# Patient Record
Sex: Female | Born: 1940 | Race: White | Hispanic: No | Smoking: Never smoker
Health system: Southern US, Community
[De-identification: ages and names within clinical notes are randomized; demographics above are authoritative.]

## PROBLEM LIST (undated history)

## (undated) ENCOUNTER — Emergency Department (HOSPITAL_COMMUNITY): Admission: EM | Payer: Medicare Other | Source: Home / Self Care

## (undated) DIAGNOSIS — M810 Age-related osteoporosis without current pathological fracture: Secondary | ICD-10-CM

## (undated) DIAGNOSIS — E039 Hypothyroidism, unspecified: Secondary | ICD-10-CM

## (undated) DIAGNOSIS — R42 Dizziness and giddiness: Secondary | ICD-10-CM

## (undated) DIAGNOSIS — K851 Biliary acute pancreatitis without necrosis or infection: Secondary | ICD-10-CM

## (undated) DIAGNOSIS — J8410 Pulmonary fibrosis, unspecified: Secondary | ICD-10-CM

## (undated) HISTORY — DX: Dizziness and giddiness: R42

## (undated) HISTORY — DX: Age-related osteoporosis without current pathological fracture: M81.0

## (undated) HISTORY — PX: TUBAL LIGATION: SHX77

## (undated) HISTORY — DX: Biliary acute pancreatitis without necrosis or infection: K85.10

## (undated) HISTORY — DX: Hypothyroidism, unspecified: E03.9

## (undated) HISTORY — DX: Pulmonary fibrosis, unspecified: J84.10

---

## 2000-08-20 ENCOUNTER — Other Ambulatory Visit: Admission: RE | Admit: 2000-08-20 | Discharge: 2000-08-20 | Payer: Self-pay | Admitting: Obstetrics and Gynecology

## 2000-08-20 ENCOUNTER — Encounter (INDEPENDENT_AMBULATORY_CARE_PROVIDER_SITE_OTHER): Payer: Self-pay

## 2001-08-15 ENCOUNTER — Other Ambulatory Visit: Admission: RE | Admit: 2001-08-15 | Discharge: 2001-08-15 | Payer: Self-pay | Admitting: Obstetrics and Gynecology

## 2005-05-06 ENCOUNTER — Ambulatory Visit: Payer: Self-pay | Admitting: Critical Care Medicine

## 2005-05-13 ENCOUNTER — Ambulatory Visit: Payer: Self-pay | Admitting: Critical Care Medicine

## 2005-05-13 ENCOUNTER — Ambulatory Visit (HOSPITAL_COMMUNITY): Admission: RE | Admit: 2005-05-13 | Discharge: 2005-05-13 | Payer: Self-pay | Admitting: Critical Care Medicine

## 2005-05-13 ENCOUNTER — Encounter (INDEPENDENT_AMBULATORY_CARE_PROVIDER_SITE_OTHER): Payer: Self-pay | Admitting: Specialist

## 2005-06-24 ENCOUNTER — Ambulatory Visit: Payer: Self-pay | Admitting: Critical Care Medicine

## 2005-08-03 ENCOUNTER — Ambulatory Visit: Payer: Self-pay | Admitting: Critical Care Medicine

## 2005-09-15 ENCOUNTER — Ambulatory Visit: Payer: Self-pay | Admitting: Critical Care Medicine

## 2005-11-06 ENCOUNTER — Ambulatory Visit: Payer: Self-pay | Admitting: Critical Care Medicine

## 2006-01-21 ENCOUNTER — Ambulatory Visit: Payer: Self-pay | Admitting: Pulmonary Disease

## 2006-02-26 ENCOUNTER — Ambulatory Visit: Payer: Self-pay | Admitting: Critical Care Medicine

## 2006-06-09 ENCOUNTER — Ambulatory Visit: Payer: Self-pay | Admitting: Critical Care Medicine

## 2006-10-08 ENCOUNTER — Ambulatory Visit: Payer: Self-pay | Admitting: Critical Care Medicine

## 2006-11-03 ENCOUNTER — Ambulatory Visit: Payer: Self-pay | Admitting: Critical Care Medicine

## 2007-02-02 ENCOUNTER — Ambulatory Visit: Payer: Self-pay | Admitting: Critical Care Medicine

## 2007-03-16 ENCOUNTER — Ambulatory Visit: Payer: Self-pay | Admitting: Pulmonary Disease

## 2007-06-30 ENCOUNTER — Ambulatory Visit: Payer: Self-pay | Admitting: Critical Care Medicine

## 2007-07-03 DIAGNOSIS — J4540 Moderate persistent asthma, uncomplicated: Secondary | ICD-10-CM

## 2007-07-03 DIAGNOSIS — J8410 Pulmonary fibrosis, unspecified: Secondary | ICD-10-CM | POA: Insufficient documentation

## 2007-07-03 DIAGNOSIS — E039 Hypothyroidism, unspecified: Secondary | ICD-10-CM

## 2007-11-24 ENCOUNTER — Ambulatory Visit: Payer: Self-pay | Admitting: Critical Care Medicine

## 2007-11-29 ENCOUNTER — Telehealth (INDEPENDENT_AMBULATORY_CARE_PROVIDER_SITE_OTHER): Payer: Self-pay | Admitting: *Deleted

## 2007-12-15 ENCOUNTER — Ambulatory Visit: Payer: Self-pay | Admitting: Critical Care Medicine

## 2008-03-20 ENCOUNTER — Ambulatory Visit: Payer: Self-pay | Admitting: Critical Care Medicine

## 2008-06-08 ENCOUNTER — Ambulatory Visit: Payer: Self-pay | Admitting: Internal Medicine

## 2008-07-11 ENCOUNTER — Ambulatory Visit: Payer: Self-pay | Admitting: Critical Care Medicine

## 2008-10-10 ENCOUNTER — Ambulatory Visit: Payer: Self-pay | Admitting: Critical Care Medicine

## 2008-10-26 HISTORY — PX: CHOLECYSTECTOMY: SHX55

## 2008-11-02 ENCOUNTER — Ambulatory Visit: Payer: Self-pay | Admitting: Internal Medicine

## 2009-02-12 ENCOUNTER — Ambulatory Visit: Payer: Self-pay | Admitting: Critical Care Medicine

## 2009-03-29 ENCOUNTER — Inpatient Hospital Stay (HOSPITAL_COMMUNITY): Admission: EM | Admit: 2009-03-29 | Discharge: 2009-04-03 | Payer: Self-pay | Admitting: Emergency Medicine

## 2009-03-29 ENCOUNTER — Ambulatory Visit: Payer: Self-pay | Admitting: Internal Medicine

## 2009-03-30 ENCOUNTER — Ambulatory Visit: Payer: Self-pay | Admitting: Internal Medicine

## 2009-04-01 ENCOUNTER — Ambulatory Visit: Payer: Self-pay | Admitting: Internal Medicine

## 2009-04-03 ENCOUNTER — Encounter (INDEPENDENT_AMBULATORY_CARE_PROVIDER_SITE_OTHER): Payer: Self-pay | Admitting: General Surgery

## 2009-04-22 ENCOUNTER — Encounter: Payer: Self-pay | Admitting: Internal Medicine

## 2009-06-12 ENCOUNTER — Ambulatory Visit: Payer: Self-pay | Admitting: Critical Care Medicine

## 2009-10-14 ENCOUNTER — Ambulatory Visit: Payer: Self-pay | Admitting: Critical Care Medicine

## 2009-10-15 DIAGNOSIS — S2239XA Fracture of one rib, unspecified side, initial encounter for closed fracture: Secondary | ICD-10-CM

## 2009-11-18 ENCOUNTER — Ambulatory Visit: Payer: Self-pay | Admitting: Internal Medicine

## 2010-01-08 ENCOUNTER — Ambulatory Visit: Payer: Self-pay | Admitting: Critical Care Medicine

## 2010-01-08 DIAGNOSIS — J309 Allergic rhinitis, unspecified: Secondary | ICD-10-CM

## 2010-01-08 HISTORY — DX: Allergic rhinitis, unspecified: J30.9

## 2010-04-15 ENCOUNTER — Ambulatory Visit: Payer: Self-pay | Admitting: Critical Care Medicine

## 2010-10-03 ENCOUNTER — Ambulatory Visit: Payer: Self-pay | Admitting: Critical Care Medicine

## 2010-11-05 ENCOUNTER — Encounter: Payer: Self-pay | Admitting: Critical Care Medicine

## 2010-11-05 ENCOUNTER — Ambulatory Visit
Admission: RE | Admit: 2010-11-05 | Discharge: 2010-11-05 | Payer: Self-pay | Source: Home / Self Care | Attending: Critical Care Medicine | Admitting: Critical Care Medicine

## 2010-11-10 ENCOUNTER — Encounter: Payer: Self-pay | Admitting: Critical Care Medicine

## 2010-11-17 ENCOUNTER — Encounter: Payer: Self-pay | Admitting: Family Medicine

## 2010-11-27 NOTE — Assessment & Plan Note (Signed)
Summary: Pulmonary OV   Primary Provider/Referring Provider:  Dr. Rudi Heap  CC:  6 month follow up.  c/o "having a hard time getting deep breathing sometimes" x couple of wks.  Occas nonprod cough.  Deneis wheezing and chest tightness. Marland Kitchen  History of Present Illness: This is a 70 year old, white female with underlying moderate, persistent asthma, and idiopathic pulmonary fibrosis.    November 18, 2009--Presents for an acute office visit. Complains of chest congestion- prod cough(yellow)-runny nose- chest tightness -sorethroat- finished Amox 500mg  5 dyas ago.  Cough did not improve, worse last 2 day with thick yellow mucus. Denies chest pain,  orthopnea, hemoptysis, fever, n/v/d, edema, headache.     January 08, 2010 10:00 AM  URI over the weekend.  Notes more coughing and min productive.  No wheeze.  Notes some pndrip Yellow mucous.  sl yellow out of the nose.   Other wise has been stable   April 15, 2010 1:47 PM Was at church all day 6/20 and felt weak.  Today is better.  Has a sl cough.  Pt notes dyspnea with exertion.  No real chest pain.  No real wheezing.  No f/c/s.  Sl pn drip.  No mucus.  No new issues. Pt denies any significant sore throat, nasal congestion or excess secretions, fever, chills, sweats, unintended weight loss, pleurtic or exertional chest pain, orthopnea PND, or leg swelling Pt denies any increase in rescue therapy over baseline, denies waking up needing it or having any early am or nocturnal exacerbations of coughing/wheezing/or dyspnea.  October 03, 2010 11:35 AM Hard time getting deep breath.  no real cough.  no wheeze.  no mucus.  Had spell of vertigo but now better rx by PCP No chest pain.  Had flu vaccine   Clinical Reports Reviewed:  CXR:  10/14/2009: CXR Results:  IMPRESSION: Right ninth rib fracture is suspected. Tiny pleural effusions.     Current Medications (verified): 1)  Synthroid 88 Mcg  Tabs (Levothyroxine Sodium) .Marland Kitchen.. 1 By Mouth Daily 2)   Adult Aspirin Low Strength 81 Mg  Tbdp (Aspirin) .... One Daily 3)  Vitamin B-12 Cr 1000 Mcg  Tbcr (Cyanocobalamin) .... One Daily 4)  Centrum Silver   Tabs (Multiple Vitamins-Minerals) .... One Daily 5)  Proair Hfa 108 (90 Base) Mcg/act  Aers (Albuterol Sulfate) .Marland Kitchen.. 1-2 Puffs Every 4-6 Hours As Needed 6)  Qvar 40 Mcg/act Aers (Beclomethasone Dipropionate) .... Two Puff Daily 7)  Calcium 600 1500 Mg Tabs (Calcium Carbonate) .... Take 1 Tablet By Mouth Once A Day 8)  Fish Oil 1000 Mg Caps (Omega-3 Fatty Acids) .... Take 1 Tablet By Mouth Once A Day  Allergies (verified): 1)  ! Sulfa  Review of Systems       The patient complains of shortness of breath with activity.  The patient denies shortness of breath at rest, productive cough, non-productive cough, coughing up blood, chest pain, irregular heartbeats, acid heartburn, indigestion, loss of appetite, weight change, abdominal pain, difficulty swallowing, sore throat, tooth/dental problems, headaches, nasal congestion/difficulty breathing through nose, sneezing, itching, ear ache, anxiety, depression, hand/feet swelling, joint stiffness or pain, rash, change in color of mucus, and fever.    Vital Signs:  Patient profile:   71 year old female Height:      62 inches Weight:      147.50 pounds BMI:     27.08 O2 Sat:      95 % on Room air Temp:     98.0 degrees F  oral Pulse rate:   68 / minute BP sitting:   132 / 84  (right arm) Cuff size:   regular  Vitals Entered By: Gweneth Dimitri RN (October 03, 2010 11:17 AM)  O2 Flow:  Room air CC: 6 month follow up.  c/o "having a hard time getting deep breathing sometimes" x couple of wks.  Occas nonprod cough.  Deneis wheezing and chest tightness.  Comments Medications reviewed with patient Daytime contact number verified with patient. Gweneth Dimitri RN  October 03, 2010 11:17 AM    Physical Exam  Additional Exam:  Gen: Pleasant, well nourished, in no distress ENT: mild erythema, mild  purulence  Neck: No JVD, no TMG, no carotid bruits Lungs:  coarse BS w/ no wheezing.  Cardiovascular: RRR, heart sounds normal, no murmurs or gallops, no peripheral edema Musculoskeletal: No deformities, no cyanosis or clubbing     Impression & Recommendations:  Problem # 1:  FIBROSIS, POSTINFLAMMATORY PULMONARY (ICD-515) Assessment Unchanged  Idiopathic pulmonary fibrosis, stable at this time. Plan continue off systemic steroids at this time.   repeat PFTs in one month  Her updated medication list for this problem includes:    Qvar 40 Mcg/act Aers (Beclomethasone dipropionate) .Marland Kitchen... 2 puff daily    Proair Hfa 108 (90 Base) Mcg/act Aers (Albuterol sulfate) .Marland Kitchen... 1-2 puffs every 4-6 hours as needed  Orders: Est. Patient Level III (47829)  Complete Medication List: 1)  Synthroid 88 Mcg Tabs (Levothyroxine sodium) .Marland Kitchen.. 1 by mouth daily 2)  Adult Aspirin Low Strength 81 Mg Tbdp (Aspirin) .... One daily 3)  Vitamin B-12 Cr 1000 Mcg Tbcr (Cyanocobalamin) .... One daily 4)  Centrum Silver Tabs (Multiple vitamins-minerals) .... One daily 5)  Proair Hfa 108 (90 Base) Mcg/act Aers (Albuterol sulfate) .Marland Kitchen.. 1-2 puffs every 4-6 hours as needed 6)  Qvar 40 Mcg/act Aers (Beclomethasone dipropionate) .... Two puff daily 7)  Calcium 600 1500 Mg Tabs (Calcium carbonate) .... Take 1 tablet by mouth once a day 8)  Fish Oil 1000 Mg Caps (Omega-3 fatty acids) .... Take 1 tablet by mouth once a day  Other Orders: Pulmonary Referral (Pulmonary)   Patient Instructions: 1)  A full pulmonary function study will be obtained sometime after Christmas, January is ok 2)  No change in medications 3)  Return in       4   months   Immunization History:  Influenza Immunization History:    Influenza:  historical (06/26/2010)   Prevention & Chronic Care Immunizations   Influenza vaccine: Historical  (06/26/2010)    Tetanus booster: Not documented    Pneumococcal vaccine: Pneumovax  (06/27/2005)     H. zoster vaccine: Not documented  Colorectal Screening   Hemoccult: Not documented    Colonoscopy: Not documented  Other Screening   Pap smear: Not documented    Mammogram: Not documented    DXA bone density scan: Not documented   Smoking status: never  (04/15/2010)  Lipids   Total Cholesterol: Not documented   LDL: Not documented   LDL Direct: Not documented   HDL: Not documented   Triglycerides: Not documented  Appended Document: Pulmonary OV fax don moore

## 2010-11-27 NOTE — Miscellaneous (Signed)
Summary: PFTs   Pulmonary Function Test Date: 11/05/2010 Height (in.): 60 Gender: Female  Pre-Spirometry FVC    Value: 2.84 L/min   Pred: 2.42 L/min     % Pred: 117 % FEV1    Value: 1.31 L     Pred: 1.71 L     % Pred: 77 % FEV1/FVC  Value: 46 %     Pred: 72 %    FEF 25-75  Value: 0.35 L/min   Pred: 2.11 L/min     % Pred: 17 %  Post-Spirometry FVC    Value: 2.76 L/min   Pred: 2.42 L/min     % Pred: 114 % FEV1    Value: 1.42 L     Pred: 1.71 L     % Pred: 83 % FEV1/FVC  Value: 51 %     Pred: 72 %    FEF 25-75  Value: 0.48 L/min   Pred: 2.11 L/min     % Pred: 23 %  Lung Volumes TLC    Value: 4.21 L   % Pred: 104 % RV    Value: 1.37 L   % Pred: 86 % DLCO    Value: 15.2 %   % Pred: 86 % DLCO/VA  Value: 4.17 %   % Pred: 118 %  Comments: Stable lung volumes and DLCO,  severe peripheral airflow obstruction   Clinical Lists Changes  Observations: Added new observation of PFT COMMENTS: Stable lung volumes and DLCO,  severe peripheral airflow obstruction  (11/10/2010 17:11) Added new observation of DLCO/VA%EXP: 118 % (11/10/2010 17:11) Added new observation of DLCO/VA: 4.17 % (11/10/2010 17:11) Added new observation of DLCO % EXPEC: 86 % (11/10/2010 17:11) Added new observation of DLCO: 15.2 % (11/10/2010 17:11) Added new observation of RV % EXPECT: 86 % (11/10/2010 17:11) Added new observation of RV: 1.37 L (11/10/2010 17:11) Added new observation of TLC % EXPECT: 104 % (11/10/2010 17:11) Added new observation of TLC: 4.21 L (11/10/2010 17:11) Added new observation of FEF2575%EXPS: 23 % (11/10/2010 17:11) Added new observation of PSTFEF25/75P: 2.11  (11/10/2010 17:11) Added new observation of PSTFEF25/75%: 0.48 L/min (11/10/2010 17:11) Added new observation of FEV1FVCPRDPS: 72 % (11/10/2010 17:11) Added new observation of PSTFEV1/FVC: 51 % (11/10/2010 17:11) Added new observation of POSTFEV1%PRD: 83 % (11/10/2010 17:11) Added new observation of FEV1PRDPST: 1.71 L (11/10/2010  17:11) Added new observation of POST FEV1: 1.42 L/min (11/10/2010 17:11) Added new observation of POST FVC%EXP: 114 % (11/10/2010 17:11) Added new observation of FVCPRDPST: 2.42 L/min (11/10/2010 17:11) Added new observation of POST FVC: 2.76 L (11/10/2010 17:11) Added new observation of FEF % EXPEC: 17 % (11/10/2010 17:11) Added new observation of FEF25-75%PRE: 2.11 L/min (11/10/2010 17:11) Added new observation of FEF 25-75%: 0.35 L/min (11/10/2010 17:11) Added new observation of FEV1/FVC PRE: 72 % (11/10/2010 17:11) Added new observation of FEV1/FVC: 46 % (11/10/2010 17:11) Added new observation of FEV1 % EXP: 77 % (11/10/2010 17:11) Added new observation of FEV1 PREDICT: 1.71 L (11/10/2010 17:11) Added new observation of FEV1: 1.31 L (11/10/2010 17:11) Added new observation of FVC % EXPECT: 117 % (11/10/2010 17:11) Added new observation of FVC PREDICT: 2.42 L (11/10/2010 17:11) Added new observation of FVC: 2.84 L (11/10/2010 17:11) Added new observation of PFT HEIGHT: 60  (11/10/2010 17:11) Added new observation of PFT DATE: 11/05/2010  (11/10/2010 17:11)  Appended Document: PFTs result noted  patient aware

## 2010-11-27 NOTE — Assessment & Plan Note (Signed)
Summary: congested/ mbw   Primary Provider/Referring Provider:  Dr. Rudi Heap  CC:  chest congestion- prod cough(yellow)-runny nose- chest tightness -sorethroat- finished Amox 500mg  5 dyas ago.  History of Present Illness: This is a 70 year old, white female with underlying moderate, persistent asthma, and idiopathic pulmonary fibrosis.    June 12, 2009 10:45 AM No new complaints  Pt denies any significant sore throat, nasal congestion or excess secretions, fever, chills, sweats, unintended weight loss, pleurtic or exertional chest pain, orthopnea PND, or leg swelling Pt denies any increase in rescue therapy over baseline, denies waking up needing it or having any early am or nocturnal exacerbations of coughing/wheezing/or dyspnea.  October 14, 2009 9:25 AM Larey Seat over the weekend.  Has pain on R side and into the back.  Tripped over a box. Hit a picture frame and smashed this. Prior to the fall was noting more dyspnea over the past month. No increase in cough.  No wheeze.  No edema in the feet.  No f/c/s.  November 18, 2009--Presents for an acute office visit. Complains of chest congestion- prod cough(yellow)-runny nose- chest tightness -sorethroat- finished Amox 500mg  5 dyas ago.  Cough did not improve, worse last 2 day with thick yellow mucus. Denies chest pain,  orthopnea, hemoptysis, fever, n/v/d, edema, headache.       Allergies (verified): 1)  ! 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 3x a week 2 cups caffeine a day married 3 children  Risk Factors: Smoking Status: never (10/14/2009)  Review of Systems      See HPI  Vital Signs:  Patient profile:   70 year old female Weight:      148.25 pounds O2 Sat:      98 % on Room air Temp:     97 degrees F oral Pulse rate:   64 / minute BP sitting:   122 / 70  (left arm) Cuff size:   regular  Vitals Entered By: Abigail Miyamoto RN (November 18, 2009 11:54 AM)  O2 Flow:  Room air  Physical Exam  Additional Exam:  Gen: Pleasant, well nourished, in no distress ENT: no lesions, no postnasal drip Neck: No JVD, no TMG, no carotid bruits Lungs:  coarse BS w/ no wheezing.  Cardiovascular: RRR, heart sounds normal, no murmurs or gallops, no peripheral edema Musculoskeletal: No deformities, no cyanosis or clubbing     Impression & Recommendations:  Problem # 1:  ASTHMA (ICD-493.90)  Exacerbation -slow to resolve.  REC:  Levaquin 500mg  once daily for 7 days Mucinex DM two times a day as needed cough/congestion  Hold fish oil untill cough is gone Please contact office for sooner follow up if symptoms do not improve or worsen  follow up Dr. Delford Field in 6-8 weeks   Orders: Est. Patient Level IV (16109)  Medications Added to Medication List This Visit: 1)  Reme Hist Dm 7.5-12.5-15 Mg/49ml Liqd (Phenylephrine-pyrilamine-dm) .... Take one tsp four times a day as needed cough 2)  Levaquin 500 Mg Tabs (Levofloxacin) .Marland Kitchen.. 1 by mouth once daily  Complete Medication List: 1)  Synthroid 88 Mcg Tabs (Levothyroxine sodium) .Marland Kitchen.. 1 by mouth daily 2)  Adult Aspirin Low Strength 81 Mg Tbdp (Aspirin) .... One daily 3)  Vitamin B-12 Cr 1000 Mcg Tbcr (Cyanocobalamin) .... One daily 4)  Centrum Silver Tabs (Multiple vitamins-minerals) .... One daily 5)  Proair Hfa 108 (90 Base) Mcg/act Aers (Albuterol sulfate) .Marland Kitchen.. 1-2 puffs  every 4-6 hours as needed 6)  Qvar 40 Mcg/act Aers (Beclomethasone dipropionate) .... 2 puffs once daily 7)  Calcium 600 1500 Mg Tabs (Calcium carbonate) .... Take 1 tablet by mouth once a day 8)  Fish Oil 1000 Mg Caps (Omega-3 fatty acids) .... Take 1 tablet by mouth once a day 9)  Reme Hist Dm 7.5-12.5-15 Mg/66ml Liqd (Phenylephrine-pyrilamine-dm) .... Take one tsp four times a day as needed cough 10)  Levaquin 500 Mg Tabs (Levofloxacin) .Marland Kitchen.. 1 by mouth once daily  Patient Instructions: 1)  Levaquin 500mg  once daily for 7 days 2)  Mucinex DM two times a day as needed cough/congestion  3)  Hold fish oil untill cough is gone 4)  Please contact office for sooner follow up if symptoms do not improve or worsen  5)  follow up Dr. Delford Field in 6-8 weeks  Prescriptions: LEVAQUIN 500 MG TABS (LEVOFLOXACIN) 1 by mouth once daily  #7 x 0   Entered and Authorized by:   Rubye Oaks NP   Signed by:   Rubye Oaks NP on 11/18/2009   Method used:   Electronically to        [facility] [phone]* (retail)       [address] [place], [place]  [address]       Ph: [phone] or [phone]       Fax: [phone]   RxID:   [id number]    Influenza Immunization History:    Influenza # 1:  Historical (07/31/2009)

## 2010-11-27 NOTE — Assessment & Plan Note (Signed)
Summary: Pulmonary OV   Primary Provider/Referring Provider:  Dr. Rudi Heap  CC:  3 month follow up.   Pt states she is occ having a difficult time getting a deep breath x2wks.  States she has a slight cough-nonprod.  Denies wheezing and chest tightness.  [name]  History of Present Illness: This is a 70 year old, white female with underlying moderate, persistent asthma, and idiopathic pulmonary fibrosis.    November 18, 2009--Presents for an acute office visit. Complains of chest congestion- prod cough(yellow)-runny nose- chest tightness -sorethroat- finished Amox 500mg  5 dyas ago.  Cough did not improve, worse last 2 day with thick yellow mucus. Denies chest pain,  orthopnea, hemoptysis, fever, n/v/d, edema, headache.     January 08, 2010 10:00 AM  URI over the weekend.  Notes more coughing and min productive.  No wheeze.  Notes some pndrip Yellow mucous.  sl yellow out of the nose.   Other wise has been stable   April 15, 2010 1:47 PM Was at church all day 6/20 and felt weak.  Today is better.  Has a sl cough.  Pt notes dyspnea with exertion.  No real chest pain.  No real wheezing.  No f/c/s.  Sl pn drip.  No mucus.  No new issues. Pt denies any significant sore throat, nasal congestion or excess secretions, fever, chills, sweats, unintended weight loss, pleurtic or exertional chest pain, orthopnea PND, or leg swelling Pt denies any increase in rescue therapy over baseline, denies waking up needing it or having any early am or nocturnal exacerbations of coughing/wheezing/or dyspnea.    Asthma History    Asthma Control Assessment:    Age range: 12+ years    Symptoms: 0-2 days/week    Nighttime Awakenings: 0-2/month    Interferes w/ normal activity: no limitations    SABA use (not for EIB): 0-2 days/week    ATAQ questionnaire: 0    Exacerbations requiring oral systemic steroids: 0-1/year    Asthma Control Assessment: Well Controlled   Preventive Screening-Counseling &  Management  Alcohol-Tobacco     Smoking Status: never  Current Medications (verified): 1)  Synthroid 88 Mcg  Tabs (Levothyroxine Sodium) .[name].. 1 By Mouth Daily 2)  Adult Aspirin Low Strength 81 Mg  Tbdp (Aspirin) .... One Daily 3)  Vitamin B-12 Cr 1000 Mcg  Tbcr (Cyanocobalamin) .... One Daily 4)  Centrum Silver   Tabs (Multiple Vitamins-Minerals) .... One Daily 5)  Proair Hfa 108 (90 Base) Mcg/act  Aers (Albuterol Sulfate) .[name].. 1-2 Puffs Every 4-6 Hours As Needed 6)  Qvar 40 Mcg/act Aers (Beclomethasone Dipropionate) .... Two Puff Daily 7)  Calcium 600 1500 Mg Tabs (Calcium Carbonate) .... Take 1 Tablet By Mouth Once A Day 8)  Fish Oil 1000 Mg Caps (Omega-3 Fatty Acids) .... Take 1 Tablet By Mouth Once A Day  Allergies (verified): 1)  ! Sulfa  Past History:  Past medical, surgical, family and social histories (including risk factors) reviewed, and no changes noted (except as noted below).  Past Medical History: Reviewed history from 06/12/2009 and no changes required. 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 Cholecystectomy      -2010  Family History: Reviewed history from 06/08/2008 and no changes required. mother died at 60 from CHF, also had emphysema father died at 81 from blood clot in the brain 2 sisters, both had breast cancer 1 sister  had asthma  Social History: Reviewed history from 06/08/2008 and no changes required. 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  The patient denies shortness of breath at rest, productive cough, non-productive cough, coughing up blood, chest pain,  irregular heartbeats, acid heartburn, indigestion, loss of appetite, weight change, abdominal pain, difficulty swallowing, sore throat, tooth/dental problems, headaches, nasal congestion/difficulty breathing through nose, sneezing, itching, ear ache, anxiety, depression, hand/feet swelling, joint stiffness or pain, rash, change in color of mucus, and fever.    Vital Signs:  Patient profile:   70 year old female Height:      62 inches Weight:      148 pounds BMI:     27.17 O2 Sat:      96 % on Room air Temp:     97.7 degrees F oral Pulse rate:   76 / minute BP sitting:   130 / 82  (right arm) Cuff size:   regular  Vitals Entered By: Gweneth Dimitri RN (April 15, 2010 1:39 PM)  O2 Flow:  Room air CC: 3 month follow up.   Pt states she is occ having a difficult time getting a deep breath x2wks.  States she has a slight cough-nonprod.  Denies wheezing and chest tightness.   Comments Medications reviewed with patient Daytime contact number verified with patient. Gweneth Dimitri RN  April 15, 2010 1:39 PM    Physical Exam  Additional Exam:  Gen: Pleasant, well nourished, in no distress ENT: mild erythema, mild purulence  Neck: No JVD, no TMG, no carotid bruits Lungs:  coarse BS w/ no wheezing.  Cardiovascular: RRR, heart sounds normal, no murmurs or gallops, no peripheral edema Musculoskeletal: No deformities, no cyanosis or clubbing     Impression & Recommendations:  Problem # 1:  ASTHMA (ICD-493.90) Assessment Improved asthmatic bronchitis improved plan No change in inhaled medications.   Maintain treatment program as currently prescribed.  Medications Added to Medication List This Visit: 1)  Qvar 40 Mcg/act Aers (Beclomethasone dipropionate) .... Two puff daily  Complete Medication List: 1)  Synthroid 88 Mcg Tabs (Levothyroxine sodium) .[name].. 1 by mouth daily 2)  Adult Aspirin Low Strength 81 Mg Tbdp (Aspirin) .... One daily 3)  Vitamin B-12 Cr 1000 Mcg Tbcr (Cyanocobalamin)  .... One daily 4)  Centrum Silver Tabs (Multiple vitamins-minerals) .... One daily 5)  Proair Hfa 108 (90 Base) Mcg/act Aers (Albuterol sulfate) .[name].. 1-2 puffs every 4-6 hours as needed 6)  Qvar 40 Mcg/act Aers (Beclomethasone dipropionate) .... Two puff daily 7)  Calcium 600 1500 Mg Tabs (Calcium carbonate) .... Take 1 tablet by mouth once a day 8)  Fish Oil 1000 Mg Caps (Omega-3 fatty acids) .... Take 1 tablet by mouth once a day  Other Orders: Est. Patient Level III (16109)  Patient Instructions: 1)  No change in medications 2)  Return in    6      months 3)  Remember to get a flu shot in October  Appended Document: Pulmonary OV fax [facility] Prey

## 2010-11-27 NOTE — Assessment & Plan Note (Signed)
Summary: Pulmonary OV   Primary Provider/Referring Provider:  Dr. Rudi Heap  CC:  2 mo follow-up.  The patient c/o cough with yellow mucus and wheezing for 4 days.Marland Kitchen  History of Present Illness: This is a 70 year old, white female with underlying moderate, persistent asthma, and idiopathic pulmonary fibrosis.    November 18, 2009--Presents for an acute office visit. Complains of chest congestion- prod cough(yellow)-runny nose- chest tightness -sorethroat- finished Amox 500mg  5 dyas ago.  Cough did not improve, worse last 2 day with thick yellow mucus. Denies chest pain,  orthopnea, hemoptysis, fever, n/v/d, edema, headache.     January 08, 2010 10:00 AM  URI over the weekend.  Notes more coughing and min productive.  No wheeze.  Notes some pndrip Yellow mucous.  sl yellow out of the nose.   Other wise has been stable   Asthma History    Asthma Control Assessment:    Age range: 12+ years    Symptoms: throughout the day    Nighttime Awakenings: 4 or more/week    Interferes w/ normal activity: some limitations    SABA use (not for EIB): 0-2 days/week    ATAQ questionnaire: 1-2    Exacerbations requiring oral systemic steroids: 0-1/year    Asthma Control Assessment: Very Poorly Controlled   Preventive Screening-Counseling & Management  Alcohol-Tobacco     Smoking Status: never  Current Medications (verified): 1)  Synthroid 88 Mcg  Tabs (Levothyroxine Sodium) .Marland Kitchen.. 1 By Mouth Daily 2)  Adult Aspirin Low Strength 81 Mg  Tbdp (Aspirin) .... One Daily 3)  Vitamin B-12 Cr 1000 Mcg  Tbcr (Cyanocobalamin) .... One Daily 4)  Centrum Silver   Tabs (Multiple Vitamins-Minerals) .... One Daily 5)  Proair Hfa 108 (90 Base) Mcg/act  Aers (Albuterol Sulfate) .Marland Kitchen.. 1-2 Puffs Every 4-6 Hours As Needed 6)  Qvar 40 Mcg/act Aers (Beclomethasone Dipropionate) .... 2 Puffs Once Daily 7)  Calcium 600 1500 Mg Tabs (Calcium Carbonate) .... Take 1 Tablet By Mouth Once A Day 8)  Fish Oil 1000 Mg Caps  (Omega-3 Fatty Acids) .... Take 1 Tablet By Mouth Once A Day  Allergies (verified): 1)  ! Sulfa  Past History:  Past medical, surgical, family and social histories (including risk factors) reviewed, and no changes noted (except as noted below).  Past Medical History: Reviewed history from 06/12/2009 and no changes required. 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 Cholecystectomy      -2010  Family History: Reviewed history from 06/08/2008 and no changes required. mother died at 53 from CHF, also had emphysema father died at 65 from blood clot in the brain 2 sisters, both had breast cancer 1 sister had asthma  Social History: Reviewed history from 06/08/2008 and no changes required. Patient never smoked.  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  The patient denies shortness of breath at rest, coughing up blood, chest pain, irregular heartbeats, acid heartburn, indigestion, loss of appetite, weight change, abdominal pain, difficulty swallowing, sore throat, tooth/dental problems, headaches, sneezing, itching, ear ache, anxiety, depression, hand/feet swelling, joint stiffness or pain, rash, and fever.  Vital Signs:  Patient profile:   70 year old female Height:      62 inches (157.48 cm) Weight:      143 pounds (65 kg) BMI:     26.25 O2 Sat:      96 % on Room air Temp:     97.6 degrees F (36.44 degrees C) oral Pulse rate:   80 / minute BP sitting:   120 / 78  (left arm) Cuff size:    regular  Vitals Entered By: Michel Bickers CMA (January 08, 2010 9:57 AM)  O2 Sat at Rest %:  96 O2 Flow:  Room air CC: 2 mo follow-up.  The patient c/o cough with yellow mucus and wheezing for 4 days. Comments Medications and phone numbers verified with the patient. Michel Bickers CMA  January 08, 2010 9:58 AM   Physical Exam  Additional Exam:  Gen: Pleasant, well nourished, in no distress ENT: mild erythema, mild purulence  Neck: No JVD, no TMG, no carotid bruits Lungs:  coarse BS w/ no wheezing.  Cardiovascular: RRR, heart sounds normal, no murmurs or gallops, no peripheral edema Musculoskeletal: No deformities, no cyanosis or clubbing     Impression & Recommendations:  Problem # 1:  OTHER ACUTE SINUSITIS (ICD-461.8) Assessment Deteriorated acute sinusitis plan Doxycycline one twice daily for 7days Flonase two sprays each nostril daily Increase qvar to two puff twice daily for 10days then reduce to once daily The following medications were removed from the medication list:    Reme Hist Dm 7.5-12.5-15 Mg/45ml Liqd (Phenylephrine-pyrilamine-dm) .Marland Kitchen... Take one tsp four times a day as needed cough    Levaquin 500 Mg Tabs (Levofloxacin) .Marland Kitchen... 1 by mouth once daily Her updated medication list for this problem includes:    Doxycycline Monohydrate 100 Mg Caps (Doxycycline monohydrate) ..... By mouth twice daily    Fluticasone Propionate 50 Mcg/act Susp (Fluticasone propionate) .Marland Kitchen..Marland Kitchen Two sprays each nostril daily  Orders: Est. 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 Plan continue off systemic steroids at this time.    Her updated medication list for this problem includes:    Qvar 40 Mcg/act Aers (Beclomethasone dipropionate) .Marland Kitchen... 2 puff daily    Proair Hfa 108 (90 Base) Mcg/act Aers (Albuterol sulfate) .Marland Kitchen... 1-2 puffs every 4-6 hours as needed  Orders: Est.  Patient Level III (52841)  Medications Added to Medication List This Visit: 1)  Qvar 40 Mcg/act Aers (Beclomethasone dipropionate) .... 2 puffs twice daily for 10days then reduce to two puff daily 2)  Doxycycline Monohydrate 100 Mg Caps (Doxycycline monohydrate) .... By mouth twice daily 3)  Fluticasone Propionate 50 Mcg/act Susp (Fluticasone propionate) .... Two sprays each nostril daily  Complete Medication List: 1)  Synthroid 88 Mcg Tabs (Levothyroxine sodium) .Marland Kitchen.. 1 by mouth daily 2)  Adult Aspirin Low Strength 81 Mg Tbdp (Aspirin) .... One daily 3)  Vitamin B-12 Cr 1000 Mcg Tbcr (Cyanocobalamin) .... One daily 4)  Centrum Silver Tabs (Multiple vitamins-minerals) .... One daily 5)  Proair Hfa 108 (90 Base) Mcg/act Aers (Albuterol sulfate) .Marland Kitchen.. 1-2 puffs every 4-6 hours as needed 6)  Qvar 40 Mcg/act Aers (Beclomethasone dipropionate) .... 2 puffs twice daily for 10days then reduce to two puff daily 7)  Calcium 600 1500 Mg Tabs (Calcium carbonate) .... Take 1 tablet by mouth once a day 8)  Fish Oil 1000 Mg Caps (Omega-3 fatty acids) .... Take 1 tablet by mouth  once a day 9)  Doxycycline Monohydrate 100 Mg Caps (Doxycycline monohydrate) .... By mouth twice daily 10)  Fluticasone Propionate 50 Mcg/act Susp (Fluticasone propionate) .... Two sprays each nostril daily  Patient Instructions: 1)  Doxycycline one twice daily for 7days 2)  Flonase two sprays each nostril daily 3)  Increase qvar to two puff twice daily for 10days then reduce to once daily 4)  No other changes 5)  Return 3 months Prescriptions: FLUTICASONE PROPIONATE 50 MCG/ACT SUSP (FLUTICASONE PROPIONATE) two sprays each nostril daily  #1 x 1   Entered and Authorized by:   Storm Frisk MD   Signed by:   Storm Frisk MD on 01/08/2010   Method used:   Electronically to        [facility] [phone]* (retail)       [address] [place], [place]  [address]       Ph: [phone] or  [phone]       Fax: [phone]   RxID:   [id number] DOXYCYCLINE MONOHYDRATE 100 MG  CAPS (DOXYCYCLINE MONOHYDRATE) By mouth twice daily  #14 x 0   Entered and Authorized by:   Storm Frisk MD   Signed by:   Storm Frisk MD on 01/08/2010   Method used:   Electronically to        [facility] [phone]* (retail)       [address] [place], [place]  [address]       Ph: [phone] or [phone]       Fax: [phone]   RxID:   [id number] QVAR 40 MCG/ACT AERS (BECLOMETHASONE DIPROPIONATE) 2 puffs twice daily for 10days then reduce to two puff daily  #1 x 6   Entered and Authorized by:   Storm Frisk MD   Signed by:   Storm Frisk MD on 01/08/2010   Method used:   Electronically to        [facility] [phone]* (retail)       [address] [place], [place]  [address]       Ph: [phone] or [phone]       Fax: [phone]   RxID:   [id number]     Appended Document: Pulmonary OV fax Vernon Prey

## 2010-11-27 NOTE — Miscellaneous (Signed)
Summary: Orders Update pft charges  Clinical Lists Changes  Orders: Added new Service order of Carbon Monoxide diffusing w/capacity (94720) - Signed Added new Service order of Lung Volumes (94240) - Signed Added new Service order of Spirometry (Pre & Post) (94060) - Signed 

## 2011-02-02 LAB — DIFFERENTIAL
Basophils Absolute: 0 10*3/uL (ref 0.0–0.1)
Basophils Absolute: 0 10*3/uL (ref 0.0–0.1)
Basophils Absolute: 0 10*3/uL (ref 0.0–0.1)
Basophils Absolute: 0.1 10*3/uL (ref 0.0–0.1)
Basophils Relative: 0 % (ref 0–1)
Basophils Relative: 0 % (ref 0–1)
Eosinophils Absolute: 0 10*3/uL (ref 0.0–0.7)
Eosinophils Absolute: 0 10*3/uL (ref 0.0–0.7)
Eosinophils Relative: 0 % (ref 0–5)
Eosinophils Relative: 0 % (ref 0–5)
Eosinophils Relative: 0 % (ref 0–5)
Eosinophils Relative: 0 % (ref 0–5)
Eosinophils Relative: 1 % (ref 0–5)
Lymphocytes Relative: 10 % — ABNORMAL LOW (ref 12–46)
Lymphocytes Relative: 10 % — ABNORMAL LOW (ref 12–46)
Lymphocytes Relative: 10 % — ABNORMAL LOW (ref 12–46)
Lymphocytes Relative: 14 % (ref 12–46)
Lymphocytes Relative: 9 % — ABNORMAL LOW (ref 12–46)
Lymphs Abs: 0.6 10*3/uL — ABNORMAL LOW (ref 0.7–4.0)
Lymphs Abs: 1 10*3/uL (ref 0.7–4.0)
Lymphs Abs: 1.1 10*3/uL (ref 0.7–4.0)
Lymphs Abs: 1.4 10*3/uL (ref 0.7–4.0)
Monocytes Absolute: 0.6 10*3/uL (ref 0.1–1.0)
Monocytes Absolute: 0.8 10*3/uL (ref 0.1–1.0)
Monocytes Relative: 5 % (ref 3–12)
Monocytes Relative: 5 % (ref 3–12)
Monocytes Relative: 6 % (ref 3–12)
Neutro Abs: 7.7 10*3/uL (ref 1.7–7.7)
Neutro Abs: 7.8 10*3/uL — ABNORMAL HIGH (ref 1.7–7.7)
Neutrophils Relative %: 83 % — ABNORMAL HIGH (ref 43–77)
Neutrophils Relative %: 85 % — ABNORMAL HIGH (ref 43–77)
Neutrophils Relative %: 86 % — ABNORMAL HIGH (ref 43–77)

## 2011-02-02 LAB — BASIC METABOLIC PANEL
BUN: 5 mg/dL — ABNORMAL LOW (ref 6–23)
BUN: 8 mg/dL (ref 6–23)
Calcium: 8.9 mg/dL (ref 8.4–10.5)
Chloride: 107 mEq/L (ref 96–112)
Creatinine, Ser: 0.58 mg/dL (ref 0.4–1.2)
GFR calc non Af Amer: >60 mL/min (ref >60)
GFR calc non Af Amer: >60 mL/min (ref >60)
Glucose, Bld: 102 mg/dL — ABNORMAL HIGH (ref 70–99)
Glucose, Bld: 85 mg/dL (ref 70–99)
Potassium: 3.2 mEq/L — ABNORMAL LOW (ref 3.5–5.1)
Potassium: 3.4 mEq/L — ABNORMAL LOW (ref 3.5–5.1)
Sodium: 138 mEq/L (ref 135–145)
Sodium: 140 mEq/L (ref 135–145)
Sodium: 141 mEq/L (ref 135–145)

## 2011-02-02 LAB — COMPREHENSIVE METABOLIC PANEL
ALT: 319 U/L — ABNORMAL HIGH (ref 0–35)
ALT: 325 U/L — ABNORMAL HIGH (ref 0–35)
AST: 25 U/L (ref 0–37)
Albumin: 3.3 g/dL — ABNORMAL LOW (ref 3.5–5.2)
Albumin: 4.2 g/dL (ref 3.5–5.2)
Alkaline Phosphatase: 91 U/L (ref 39–117)
BUN: 15 mg/dL (ref 6–23)
BUN: 7 mg/dL (ref 6–23)
CO2: 24 mEq/L (ref 19–32)
CO2: 25 mEq/L (ref 19–32)
CO2: 28 mEq/L (ref 19–32)
Calcium: 8.2 mg/dL — ABNORMAL LOW (ref 8.4–10.5)
Calcium: 8.6 mg/dL (ref 8.4–10.5)
Calcium: 8.7 mg/dL (ref 8.4–10.5)
Chloride: 105 mEq/L (ref 96–112)
Chloride: 107 mEq/L (ref 96–112)
Chloride: 109 mEq/L (ref 96–112)
Creatinine, Ser: 0.63 mg/dL (ref 0.4–1.2)
Creatinine, Ser: 0.77 mg/dL (ref 0.4–1.2)
GFR calc Af Amer: >60 mL/min (ref >60)
GFR calc Af Amer: >60 mL/min (ref >60)
GFR calc non Af Amer: >60 mL/min (ref >60)
GFR calc non Af Amer: >60 mL/min (ref >60)
GFR calc non Af Amer: >60 mL/min (ref >60)
Glucose, Bld: 118 mg/dL — ABNORMAL HIGH (ref 70–99)
Glucose, Bld: 87 mg/dL (ref 70–99)
Potassium: 3.6 mEq/L (ref 3.5–5.1)
Potassium: 3.7 mEq/L (ref 3.5–5.1)
Sodium: 138 mEq/L (ref 135–145)
Sodium: 140 mEq/L (ref 135–145)
Total Bilirubin: 0.7 mg/dL (ref 0.3–1.2)
Total Bilirubin: 0.8 mg/dL (ref 0.3–1.2)
Total Bilirubin: 1 mg/dL (ref 0.3–1.2)

## 2011-02-02 LAB — URINALYSIS, ROUTINE W REFLEX MICROSCOPIC
Glucose, UA: NEGATIVE mg/dL
Hgb urine dipstick: NEGATIVE
Specific Gravity, Urine: 1.015 (ref 1.005–1.030)

## 2011-02-02 LAB — CBC
HCT: 32.1 % — ABNORMAL LOW (ref 36.0–46.0)
HCT: 34.4 % — ABNORMAL LOW (ref 36.0–46.0)
HCT: 37 % (ref 36.0–46.0)
Hemoglobin: 11.2 g/dL — ABNORMAL LOW (ref 12.0–15.0)
Hemoglobin: 12.1 g/dL (ref 12.0–15.0)
Hemoglobin: 12.2 g/dL (ref 12.0–15.0)
Hemoglobin: 13 g/dL (ref 12.0–15.0)
Hemoglobin: 13.9 g/dL (ref 12.0–15.0)
MCHC: 34.9 g/dL (ref 30.0–36.0)
MCHC: 35.4 g/dL (ref 30.0–36.0)
MCHC: 35.4 g/dL (ref 30.0–36.0)
MCV: 91.2 fL (ref 78.0–100.0)
MCV: 91.8 fL (ref 78.0–100.0)
MCV: 92.2 fL (ref 78.0–100.0)
Platelets: 174 10*3/uL (ref 150–400)
Platelets: 177 10*3/uL (ref 150–400)
Platelets: 223 10*3/uL (ref 150–400)
Platelets: 225 10*3/uL (ref 150–400)
RBC: 3.51 MIL/uL — ABNORMAL LOW (ref 3.87–5.11)
RBC: 3.74 MIL/uL — ABNORMAL LOW (ref 3.87–5.11)
RBC: 4.07 MIL/uL (ref 3.87–5.11)
RBC: 4.35 MIL/uL (ref 3.87–5.11)
RDW: 13.2 % (ref 11.5–15.5)
RDW: 13.2 % (ref 11.5–15.5)
RDW: 13.3 % (ref 11.5–15.5)
WBC: 10.2 10*3/uL (ref 4.0–10.5)
WBC: 10.5 10*3/uL (ref 4.0–10.5)
WBC: 10.8 10*3/uL — ABNORMAL HIGH (ref 4.0–10.5)
WBC: 10.9 10*3/uL — ABNORMAL HIGH (ref 4.0–10.5)
WBC: 9.4 10*3/uL (ref 4.0–10.5)
WBC: 9.6 10*3/uL (ref 4.0–10.5)

## 2011-02-02 LAB — LIPASE, BLOOD
Lipase: 16 U/L (ref 11–59)
Lipase: 20 U/L (ref 11–59)
Lipase: 743 U/L — ABNORMAL HIGH (ref 11–59)

## 2011-02-02 LAB — C-REACTIVE PROTEIN: CRP: 14.1 mg/dL — ABNORMAL HIGH (ref <0.6)

## 2011-02-02 LAB — AMYLASE: Amylase: 64 U/L (ref 27–131)

## 2011-02-03 ENCOUNTER — Encounter: Payer: Self-pay | Admitting: Critical Care Medicine

## 2011-02-04 ENCOUNTER — Ambulatory Visit (INDEPENDENT_AMBULATORY_CARE_PROVIDER_SITE_OTHER): Payer: Self-pay | Admitting: Critical Care Medicine

## 2011-02-04 ENCOUNTER — Encounter: Payer: Self-pay | Admitting: Critical Care Medicine

## 2011-02-04 VITALS — BP 140/88 | HR 79 | Temp 97.6°F | Ht 62.0 in | Wt 148.8 lb

## 2011-02-04 DIAGNOSIS — J8410 Pulmonary fibrosis, unspecified: Secondary | ICD-10-CM

## 2011-02-04 DIAGNOSIS — J45909 Unspecified asthma, uncomplicated: Secondary | ICD-10-CM

## 2011-02-04 MED ORDER — BECLOMETHASONE DIPROPIONATE 40 MCG/ACT IN AERS: 2.0000 | INHALATION_SPRAY | Freq: Every day | RESPIRATORY_TRACT | Status: DC

## 2011-02-04 NOTE — Progress Notes (Signed)
Subjective:    Patient ID: [name], female    DOB: [birth year], 70 y.o.   MRN: [id number]  HPI  70 y.o.  WF with hx of moderate persistent asthma and pulm fibrosis idiopathic October 03, 2010 11:35 AM  Hard time getting deep breath. no real cough. no wheeze. no mucus. Had spell of vertigo but now better rx by PCP  No chest pain. Had flu vaccine   02/04/2011 Last ov 12/11: no changes made at that time Now no new issues. No new complaints Pt denies any significant sore throat, nasal congestion or excess secretions, fever, chills, sweats, unintended weight loss, pleurtic or exertional chest pain, orthopnea PND, or leg swelling Pt denies any increase in rescue therapy over baseline, denies waking up needing it or having any early am or nocturnal exacerbations of coughing/wheezing/or dyspnea. Pt also denies any obvious fluctuation in symptoms with  weather or environmental change or other alleviating or aggravating factors  Pt had PFTs in 1/12: Pulmonary Function Test  Date: 11/05/2010  Height (in.): 60  Gender: Female  Pre-Spirometry  FVC Value: 2.84 L/min Pred: 2.42 L/min % Pred: 117 %  FEV1 Value: 1.31 L Pred: 1.71 L % Pred: 77 %  FEV1/FVC Value: 46 % Pred: 72 % FEF 25-75 Value: 0.35 L/min Pred: 2.11 L/min % Pred: 17 %  Post-Spirometry  FVC Value: 2.76 L/min Pred: 2.42 L/min % Pred: 114 %  FEV1 Value: 1.42 L Pred: 1.71 L % Pred: 83 %  FEV1/FVC Value: 51 % Pred: 72 % FEF 25-75 Value: 0.48 L/min Pred: 2.11 L/min % Pred: 23 %  Lung Volumes  TLC Value: 4.21 L % Pred: 104 %  RV Value: 1.37 L % Pred: 86 %  DLCO Value: 15.2 % % Pred: 86 %  DLCO/VA Value: 4.17 % % Pred: 118 %  Comments:  Stable lung volumes and DLCO, severe peripheral airflow obstruction    Past Medical History  Diagnosis Date  . Postinflammatory pulmonary fibrosis   . Hypothyroidism   . Asthma   . Gallstone pancreatitis      Family History  Problem Relation Age of Onset  . Heart failure Mother   .  Emphysema Mother   . Other Father     blood clot in brain  . Breast cancer Sister   . Breast cancer Sister   . Asthma Sister      History   Social History  . Marital Status: Married    Spouse Name: N/A    Number of Children: 3  . Years of Education: N/A   Occupational History  . retired Scientist, product/process development    Social History Main Topics  . Smoking status: Never Smoker   . Smokeless tobacco: Never Used  . Alcohol Use: No  . Drug Use: No  . Sexually Active: Not on file   Other Topics Concern  . Not on file   Social History Narrative  . No narrative on file     Allergies  Allergen Reactions  . Sulfonamide Derivatives      Outpatient Prescriptions Prior to Visit  Medication Sig Dispense Refill  . albuterol (PROAIR HFA) 108 (90 BASE) MCG/ACT inhaler Inhale 2 puffs into the lungs every 6 (six) hours as needed.        Marland Kitchen aspirin 81 MG tablet Take 81 mg by mouth daily.        . Calcium Carbonate (CALCIUM 600) 1500 MG TABS Take 1 tablet by mouth daily.        Marland Kitchen  fish oil-omega-3 fatty acids 1000 MG capsule Take 1 g by mouth daily.        Marland Kitchen levothyroxine (SYNTHROID, LEVOTHROID) 88 MCG tablet Take 88 mcg by mouth daily.        . Multiple Vitamins-Minerals (CENTRUM SILVER PO) Take 1 tablet by mouth daily.        . vitamin B-12 (CYANOCOBALAMIN) 1000 MCG tablet Take 1,000 mcg by mouth daily.        . beclomethasone (QVAR) 40 MCG/ACT inhaler Inhale 2 puffs into the lungs daily.           Review of Systems Constitutional:   No  weight loss, night sweats,  Fevers, chills, fatigue, lassitude. HEENT:   No headaches,  Difficulty swallowing,  Tooth/dental problems,  Sore throat,                No sneezing, itching, ear ache, nasal congestion, post nasal drip,   CV:  No chest pain,  Orthopnea, PND, swelling in lower extremities, anasarca, dizziness, palpitations  GI  No heartburn, indigestion, abdominal pain, nausea, vomiting, diarrhea, change in bowel habits, loss of appetite  Resp:  Notes  shortness of breath with exertion not at rest.  No excess mucus, no productive cough,  No non-productive cough,  No coughing up of blood.  No change in color of mucus.  No wheezing.  No chest wall deformity  Skin: no rash or lesions.  GU: no dysuria, change in color of urine, no urgency or frequency.  No flank pain.  MS:  No joint pain or swelling.  No decreased range of motion.  No back pain.  Psych:  No change in mood or affect. No depression or anxiety.  No memory loss.     Objective:   Physical Exam Gen: Pleasant, well-nourished, in no distress,  normal affect  ENT: No lesions,  mouth clear,  oropharynx clear, no postnasal drip  Neck: No JVD, no TMG, no carotid bruits  Lungs: No use of accessory muscles, no dullness to percussion, distant bs  Cardiovascular: RRR, heart sounds normal, no murmur or gallops, no peripheral edema  Abdomen: soft and NT, no HSM,  BS normal  Musculoskeletal: No deformities, no cyanosis or clubbing  Neuro: alert, non focal  Skin: Warm, no lesions or rashes        Assessment & Plan:   ASTHMA Recent PFTs reveal moderate airflow obstruction that is stable Plan No change in inhaled or maintenance medications. Return in  4 months   FIBROSIS, POSTINFLAMMATORY PULMONARY Stable ipf  Recent pfts reveal normal DLCO 86% predicted Plan No systemic steroids monitor    Updated Medication List Outpatient Encounter Prescriptions as of 02/04/2011  Medication Sig Dispense Refill  . albuterol (PROAIR HFA) 108 (90 BASE) MCG/ACT inhaler Inhale 2 puffs into the lungs every 6 (six) hours as needed.        Marland Kitchen aspirin 81 MG tablet Take 81 mg by mouth daily.        . beclomethasone (QVAR) 40 MCG/ACT inhaler Inhale 2 puffs into the lungs daily.  1 Inhaler  11  . Calcium Carbonate (CALCIUM 600) 1500 MG TABS Take 1 tablet by mouth daily.        . fish oil-omega-3 fatty acids 1000 MG capsule Take 1 g by mouth daily.        Marland Kitchen levothyroxine (SYNTHROID,  LEVOTHROID) 88 MCG tablet Take 88 mcg by mouth daily.        . Multiple Vitamins-Minerals (CENTRUM SILVER PO) Take  1 tablet by mouth daily.        . vitamin B-12 (CYANOCOBALAMIN) 1000 MCG tablet Take 1,000 mcg by mouth daily.        Marland Kitchen DISCONTD: beclomethasone (QVAR) 40 MCG/ACT inhaler Inhale 2 puffs into the lungs daily.

## 2011-02-04 NOTE — Assessment & Plan Note (Signed)
Recent PFTs reveal moderate airflow obstruction that is stable Plan No change in inhaled or maintenance medications. Return in  4 months

## 2011-02-04 NOTE — Assessment & Plan Note (Signed)
Stable ipf  Recent pfts reveal normal DLCO 86% predicted Plan No systemic steroids monitor

## 2011-02-04 NOTE — Patient Instructions (Signed)
Refill sent for qvar No medication changes Return 4 months

## 2011-02-06 ENCOUNTER — Other Ambulatory Visit: Payer: Self-pay | Admitting: Critical Care Medicine

## 2011-03-10 NOTE — Group Therapy Note (Signed)
[name], [name]               ACCOUNT NO.:  [id number]   MEDICAL RECORD NO.:  [id number]          PATIENT TYPE:  INP   LOCATION:  A338                          FACILITY:  [facility]   PHYSICIAN:  Margaretmary Dys, M.D.DATE OF BIRTH:  June 23, 1941   DATE OF PROCEDURE:  03/31/2009  DATE OF DISCHARGE:                                 PROGRESS NOTE   SUBJECTIVE:  The patient continues to have some abdominal pain, although  she said it is much better.  She reports pain to be about like 2-3 out  of 10 The patient is also not really eating.  She denies any fevers or  chills.  The patient was diagnosed with acute gallstone pancreatitis.  The patient was seen by Dr. Kendell Bane, who has recommended possibly  evaluation by general surgery.  I have consulted Dr. Suzette Battiest.   OBJECTIVE:  Conscious, in some mild pain distress.  VITAL SIGNS:  Blood  pressure is 120/68, pulse 76, respirations 20, temperature 100.8 degrees  Fahrenheit, oxygen saturation was 94% on room air.  HEENT:  Normocephalic, atraumatic.  Oral mucosa was dry.  No exudates were  noted.  NECK:  Supple.  No JVD or lymphadenopathy.  LUNGS:  Clear  clinically.  Good air entry bilaterally.  HEART:  S1 and S2, no  excessive gallops or rubs.  Abdomen:  Soft.  The patient had some mild  tenderness over the right upper quadrant and also the epigastrium, with  no guarding.  Bowel sounds were positive.  EXTREMITIES:  No edema.  No  calf induration or tenderness was noted.   LABORATORY/DIAGNOSTIC DATA:  White blood cell count 10.8, hemoglobin 12,  hematocrit 34, platelet count 177 with 83% neutrophils.  ESR was 38.  Sodium 138, potassium 3.2, chloride 107, CO2 is 27, glucose 85, BUN 5.  Calcium 8.5.  Lipase level had gone down to 743, there is one pending  for today.  Liver function test is also pending from today.   ASSESSMENT AND PLAN:  1. Acute gallstone pancreatitis.  2. Acute cholecystitis, found on ultrasound.  3. History of  hypothyroidism.  4. History of pulmonary fibrosis.  5. History of asthma.   PLAN:  1. The patient is doing better from the pain control standpoint.      Lipase and also liver function test was also improving over the      last few days.  We continue to watch.  2. The patient was also seen by the gastroenterologist, Dr. Pandora Leiter      has recommended to continue current treatment.  3. I will initiate antibiotic therapy with Zosyn.  The patient did      have a temperature of 101.   The patient is being maintained on clear liquids for now, until we have  a definite plan; although being conservative until things cool off, it  is also probably very desirable at this point. We also consulted Dr.  Leticia Penna.      Margaretmary Dys, M.D.  Electronically Signed     AM/MEDQ  D:  03/31/2009  T:  03/31/2009  Job:  [id number]

## 2011-03-10 NOTE — Consult Note (Signed)
[name], [name]               ACCOUNT NO.:  [id number]   MEDICAL RECORD NO.:  [id number]          PATIENT TYPE:  INP   LOCATION:  A338                          FACILITY:  [facility]   PHYSICIAN:  R. Roetta Sessions, M.D. DATE OF BIRTH:  [birth year]   DATE OF CONSULTATION:  03/29/2009  DATE OF DISCHARGE:                                 CONSULTATION   PHYSICIAN REQUESTING CONSULTATION:  Dr. Mikeal Hawthorne with [facility].   REASON FOR CONSULTATION:  Pancreatitis, elevated LFTs.   PRIMARY CARE PHYSICIAN:  Dr. Vernon Prey in [facility].   HISTORY OF PRESENT ILLNESS:  Patient is a 70 year old Caucasian female  with known history of asthma and mild pulmonary fibrosis who presented  to the emergency department yesterday morning with complaints of  progressive persistent epigastric pain.  She states she has had similar  episodes intermittently over the last 3 months.  She generally has about  1 episode a month which lasts for about 15 minutes at a time but would  go away.  Yesterday, she had leftover flounder for lunch and developed  epigastric pain radiating into her back and into the upper abdomen.  Pain persisted.  She was seen by Dr. Rudi Heap and it was felt that  she had acute cholecystitis and was supposed to have an ultrasound this  morning but because of severe pain she came to the emergency department.  She has had some vomiting.  Denies any fever.  Denies constipation,  diarrhea, melena, or rectal bleeding.  Denies dysphagia or odynophagia.  No heartburn.   On evaluation, she was found to have a lipase greater than 2000, total  bilirubin was 2.3, alk phos 102, AST 444, ALT 325, glucose 175, white  count 10,900.  She has had abdominal ultrasound this morning, results  are pending.  She remains afebrile.  Heart rate in the 50s to 60s.   MEDICATIONS AT HOME:  1. Synthroid 100 mcg daily.  2. Multivitamin daily.  3. B12 daily.  4. Aspirin 81 mg daily.  5.  Darvocet every 4 hours as needed.  6. QVAR inhaler 2 puffs q.h.s.   ALLERGIES:  SULFA CAUSES NAUSEA AND VOMITING.   PAST MEDICAL HISTORY:  1. Asthma and mild pulmonary fibrosis, followed by Dr. Shan Levans.  2. Hypothyroidism.  3. She had a bronchoscopy back in 2006 for bilateral pulmonary      infiltrates with negative cytology.  4. Remote colonoscopy, denies having polyps.  States she does not want      to have any further colonoscopy because of complications at that      time.  She apparently developed what sounds like an abscess in the      perineal area shortly after procedure.   PAST SURGICAL HISTORY:  Tubal ligation.   FAMILY HISTORY:  Negative for colorectal cancer, liver disease,  pancreatic disease.  Family history is significant for cholecystitis.   SOCIAL HISTORY:  She is married.  Never been a smoker.  No alcohol use  or drug use.  She is able to do ADLs without assistance.  With regard to  her lung status, she is able to do her daily activities around the house  without any shortness of breath but beyond that does have some  exertional shortness of breath.   REVIEW OF SYSTEMS:  See HPI for GI.  CONSTITUTIONAL:  Denies weight  loss.  See HPI for cardiopulmonary.  GENITOURINARY:  Denies dysuria or  hematuria.   PHYSICAL EXAM:  Temp 96.7.  Pulse 62.  Respirations 20.  Blood pressure  158/74.  O2 saturations 98% on room air.  GENERAL:  Pleasant, well-nourished, well-developed, Caucasian female in  no acute distress.  SKIN:  Warm and dry.  No jaundice.  HEENT:  Sclerae nonicteric.  Oropharyngeal mucosa moist and pink.  No  lesions, erythema, or exudate.  No lymphadenopathy or thyromegaly.  CHEST:  Lungs are clear to auscultation.  CARDIAC:  Reveals regular rate and rhythm.  No murmurs, rubs, or  gallops.  ABDOMEN:  Positive bowel sounds.  Abdomen is obese, soft.  Symmetrical.  She has moderate tenderness throughout the upper abdomen to deep  palpation.  No  rebound or guarding.  No organomegaly or masses.  No  abdominal bruits or hernias.  LOWER EXTREMITIES:  No edema.   LABS:  As mentioned above.  In addition, sodium is 140, potassium 3.7,  creatinine 0.77, hemoglobin 13.9.  Abdominal ultrasound pending.   IMPRESSION:  A 69 year old lady with intermittent what appears to be  biliary colic who now presents with likely a biliary pancreatitis.  Abdominal ultrasound pending.  Clinically appears to be stable at this  time.   RECOMMENDATIONS:  1. Follow up abdominal ultrasound as available.  2. N.p.o.  3. We will recheck CMET, CBC, PT, PTT midday today.  4. Further recommendations to follow.   I would like to thank Dr. Mikeal Hawthorne with [facility] for allowing  [place] to take part in the care of this patient.      Tana Coast, P.AJonathon Bellows, M.D.  Electronically Signed    LL/MEDQ  D:  03/29/2009  T:  03/29/2009  Job:  [id number]   cc:   Lonia Blood, M.D.   Ernestina Penna, M.D.  Fax: [phone]

## 2011-03-10 NOTE — H&P (Signed)
[name], [name]               ACCOUNT NO.:  [id number]   MEDICAL RECORD NO.:  [id number]          PATIENT TYPE:  INP   LOCATION:  A338                          FACILITY:  [facility]   PHYSICIAN:  Lonia Blood, M.D.      DATE OF BIRTH:  1941-04-20   DATE OF ADMISSION:  03/28/2009  DATE OF DISCHARGE:  LH                              HISTORY & PHYSICAL   PRIMARY CARE PHYSICIAN:  Dr. Rudi Heap.   PRESENTING COMPLAINT:  Abdominal pain.   HISTORY OF PRESENT ILLNESS:  The patient is a 70 year old female with  known history of asthma and pulmonary fibrosis that apparently started  having epigastric and right upper quadrant abdominal pain on and off for  the past 3 months.  Usually lasts about 15 minutes once a month, but  today it came and lasted the whole day.  Pain was so persistent that she  went to see Dr. Rudi Heap, who diagnosed her with possible acute  cholecystitis and was supposed to have an ultrasound in the morning.  The patient's pain, however, continued to deteriorate and she could not  tolerate it and came to the emergency room.  She has had some nausea and  vomiting associated with it, and it is getting worse with meals.  She  took some Darvocet, which did not help matters.  The pain at its worst  was 8/10.  It radiates to the back.  She denied any hematochezia.  Nor  melena, no hematemesis.  She denied any fever.  No chills.  No  dizziness.   PAST MEDICAL HISTORY:  1. Significant for asthma.  2. Hypothyroidism.  3. Pulmonary fibrosis..   ALLERGIES:  She she is allergic to SULFA.   MEDICATIONS:  1. Darvocet-N 100 1 tablet q.4 h. p.r.n.  2. Tylenol as needed.  3. Qvar inhaler 2 puffs q.h.s.  4. Synthroid 50 mcg daily.  5. Centrum Silver 1 tablet daily.  6. B12 tablet once daily.   SOCIAL HISTORY:  The patient denied any tobacco, alcohol or IV drug use.  She has been fairly stable prior to this episode of pain.   FAMILY HISTORY:  Denied any significant family  history.   REVIEW OF SYSTEMS:  A 14-point review of systems is negative except per  HPI.   PHYSICAL EXAMINATION:  The patient is afebrile.  Temperature 97, blood pressure 116/69, her pulse is 51, respiratory rate  20, saturation is 92% on room air.  GENERAL:  The patient is awake, alert, oriented, in obvious distress.  HEENT:  PERRL.  EOMI.  NECK:  Supple.  No JVD.  No lymphadenopathy.  RESPIRATORY:  She has good air entry bilaterally.  No wheezes.  No  rales.  CARDIOVASCULAR SYSTEM:  The patient has S1, S2.  No murmur.  ABDOMEN:  Soft with diffuse epigastric tenderness.  No guarding.  EXTREMITIES: No edema, cyanosis or clubbing.   LABORATORY DATA:  Her lipase is more than 2000.  Urinalysis essentially  negative.  Sodium 140, potassium 3.7, chloride 104, CO2 29, glucose 175,  BUN 15, creatinine 0.77, total bilirubin 2.3, alkaline  phosphatase 102,  AST 444, ALT 325, total protein 7.1, albumin 4.2 and calcium 9.8.  White  count 10.9, hemoglobin 13.9, platelet count 241 with left shift, ANC of  9.3.   ASSESSMENT:  Therefore, the patient is a 70 year old female presenting  with epigastric pain, elevated liver function tests as well as markedly  elevated lipase.  Per Ranson criteria, the patient is having moderately  severe pancreatitis.  Most likely this is gallstone pancreatitis with  the elevation in her liver function tests.  We will therefore proceed as  follows:  1. Acute pancreatitis.  We will admit the patient and keep her as      n.p.o.  Pain control.  Check LDH.  Check right upper quadrant      ultrasound for possible gallstones.  Possibly a CT abdomen and      pelvis as well to see if there are any complications of      pancreatitis.  If so, we will get GI to see the patient and further      management and workup will be performed.  2. Asthma.  I will put her on nebulizers in the hospital as necessary.  3. Pulmonary fibrosis.  Again, this is chronic.  The patient will be       treated with oxygen as much as possible.  4. Hypothyroidism.  We will check TSH and continue with her Synthroid.   Otherwise, further treatment will depend on how the patient responds to  this treatment in the hospital.      Lonia Blood, M.D.  Electronically Signed     LG/MEDQ  D:  03/29/2009  T:  03/29/2009  Job:  [id number]

## 2011-03-10 NOTE — Consult Note (Signed)
NAMECHRISTOL, THETFORD               ACCOUNT NO.:  [id number]   MEDICAL RECORD NO.:  [id number]          PATIENT TYPE:  INP   LOCATION:  A338                          FACILITY:  [facility]   PHYSICIAN:  Tilford Pillar, MD      DATE OF BIRTH:  31-Aug-1941   DATE OF CONSULTATION:  03/31/2009  DATE OF DISCHARGE:  04/03/2009                                 CONSULTATION   CHIEF COMPLAINT:  Abdominal pain.   HISTORY OF PRESENT ILLNESS:  The patient is a 70 year old female, who  presented to [facility] with epigastric abdominal pain.  She  states that this pain is localized.  It does not radiate.  She has had  no history of jaundice.  No fever or chills.  Some nausea, but no  episodes of emesis.  No change in bowel habits.  No melena.  No  hematochezia.   PAST MEDICAL HISTORY:  1. Asthma.  2. Hypothyroidism.  3. Pulmonary fibrosis.   PAST SURGICAL HISTORY:  None.   MEDICATIONS:  Darvocet, Tylenol, QVAR, and Synthroid.   ALLERGIES:  SULFA.   SOCIAL HISTORY:  No tobacco use.  No alcohol use.  No IV drug abuse.   PHYSICAL EXAMINATION:  VITAL SIGNS:  Temperature 97, heart rate 55,  respirations 20, blood pressure 118/66, and O2 saturations 94% on room  air.  GENERAL:  The patient is in a seated position in her hospital bed.  She  is alert and oriented x3.  Family is present at the time of evaluation.  HEENT:  Scalp, no deformities.  Eyes, pupils equal, round, reactive.  Extraocular movements intact.  No scleral icterus or conjunctival pallor  is noted.  Oral mucosa is pink.  Normal occlusion.  NECK:  Trachea is midline.  No cervical lymphadenopathy.  PULMONARY:  Unlabored respirations.  She is clear to auscultation  bilaterally.  CARDIOVASCULAR:  Regular rate and rhythm.  No murmurs or gallops are  apparent.  EXTREMITIES:  She has 2+ radial, femoral, and dorsalis pedis pulses  bilaterally.  ABDOMEN:  She has slightly diminished bowel sounds, although they are  present.  They are  slow.  Her abdomen is flat and soft.  She has  moderate epigastric tenderness on palpation.  She has no diffuse  peritoneal signs.  There is no ecchymosis periumbilical or flank  ecchymosis.  She has no evidence of masses or herniations.   PERTINENT LABORATORY DATA AND RADIOGRAPHIC STUDIES:  The patient does  have a right upper quadrant ultrasound demonstrating cholelithiasis.  CBC, white blood cell count 10.8, hemoglobin 12, hematocrit 34,  platelets 177.  Basic metabolic panel, sodium 138, potassium 3.2,  chloride 107, bicarb is 27, BUN is 5, glucose is 85, and lipase is 743.   ASSESSMENT/PLAN:  Acute pancreatitis.  At this time, pancreatitis does  seem to be resolving, although I did discuss with the patient  anticipated course.  Due to her history, I do suspect that this is  likely a gallstone etiology of her pancreatitis.  Risks, benefits, and  alternatives of laparoscopic possible open cholecystectomy with  intraoperative cholangiogram were discussed  at length with the patient.  Her questions and concerns were addressed.  At this time, we will plan  to proceed upon improvement of her symptomatology.  I discussed with the  patient that as her symptoms improve, her amylase and lipase return to  normal.  Her white blood cell count remains normal.  At that time, I  would start to advance the patient on her diet upon reaching a regular  diet without exacerbation of her symptomatology and elevation of amylase  and lipase.  At that time, we will plan to proceed to the operating room  for planned cholecystectomy with anticipated discharge more than likely  within 24 hours following the operation.  She does understand and we  will continue with current management of her pancreatitis at this time.  I appreciate the opportunity to assist in this patient's care.      Tilford Pillar, MD  Electronically Signed     BZ/MEDQ  D:  04/08/2009  T:  04/09/2009  Job:  [id number]

## 2011-03-10 NOTE — Assessment & Plan Note (Signed)
[facility]                             PULMONARY OFFICE NOTE   [name], [name]                      MRN:          [id number]  DATE:03/16/2007                            DOB:          17-Aug-1941    HISTORY OF PRESENT ILLNESS:  The patient is a 70 year old white female  patient of Dr. Delford Field, who has a known history of moderate persistent  asthma, who presents for a 2-day history of nasal congestion, cough,  fever, chills and sore throat.  The patient denies any chest pain,  orthopnea, PND or leg swelling.   PAST MEDICAL HISTORY:  Reviewed.   CURRENT MEDICATIONS:  Reviewed.   PHYSICAL EXAMINATION:  GENERAL:  The patient is a pleasant female in no  acute distress.  VITAL SIGNS:  She is afebrile with stable vital signs.  O2 saturation is  96% on room air.  HEENT:  Posterior pharynx with some mild erythema.  Nontender sinuses.  NECK:  Supple without adenopathy.  LUNGS:  Lung sounds are clear.  CARDIAC:  Regular rate.  ABDOMEN:  Soft and nontender.  EXTREMITIES:  Warm without any edema.   IMPRESSION AND PLAN:  Acute upper respiratory infection.  The patient  will begin a Z-Pak x1.  Mucinex DM twice daily.  May use Tussionex as  needed for cough one teaspoon every 12 hours as needed for cough.  The  patient is aware of the sedating effect.  The patient will follow back  up with Dr. Delford Field in 2 months or sooner if needed.      Gabrielle Oaks, NP  Electronically Signed      Charlcie Cradle Delford Field, MD, [facility]  Electronically Signed   TP/MedQ  DD: 03/16/2007  DT: 03/16/2007  Job #: [id number]

## 2011-03-10 NOTE — Discharge Summary (Signed)
[name], [name]               ACCOUNT NO.:  [id number]   MEDICAL RECORD NO.:  [id number]          PATIENT TYPE:  INP   LOCATION:  A338                          FACILITY:  [facility]   PHYSICIAN:  Osvaldo Shipper, MD     DATE OF BIRTH:  1941/06/07   DATE OF ADMISSION:  03/28/2009  DATE OF DISCHARGE:  LH                               DISCHARGE SUMMARY   The patient's PMD is Dr. Rudi Heap at [place], [place].   DISCHARGE DIAGNOSES:  1. Acute cholecystitis with cholelithiasis.  2. Transient pancreatitis.  3. History of pulmonary fibrosis.  4. Hypothyroidism.   Please review history and physical dictated at the time of admission for  the details regarding the patient's presenting illness.   BRIEF HOSPITAL COURSE:  Briefly, this is a 70 year old Caucasian female  who presented to the hospital complaining of abdominal pain.  She was  found to have cholecystitis on ultrasound with cholelithiasis.  She had  elevated lipase level at 743.  This normalized quickly, and within a  couple of days, the lipase level was 16.  She was also hypokalemic which  was repleted.  Her LFTs were high with AST of 249 and ALT of 319, and  all of these have normalized as well.  C-reactive protein was high.  Thyroid function tests were normal.  Coags were normal.  ESR was high.  The patient was seen by GI who recommended surgery, and the patient  underwent laparoscopic cholecystectomy today.  She is feeling pretty  well after the surgery.  She is complaining of some soreness in her  belly.  Otherwise denies any nausea or vomiting.  She is tolerating  clear liquids.   Her vital signs are all stable post surgery, saturating 93% on room air.  Blood pressure is 130s/60s, heart rate 71.  She is afebrile.  Lungs are  clear except for bases where they are a few crackles.  Cardiac exam was  unremarkable.  Abdomen is soft.  Tenderness over the incision sites is  noted.   If the patient tolerates supper with  no vomiting, she should be able to  go home.   DISCHARGE MEDICATIONS:  1. Lortab 5/600 one to two tablets every 4 hours as needed for pain,      45 tablets prescribed by Dr. Leticia Penna.  2. Qvar 2 puffs every night 40 mcg.  3. Synthroid 100 mcg daily.  4. Centrum Silver daily.  5. B12 daily.  6. Aspirin 81 mg daily.  7. Darvocet every 4 hours as needed.  This will be asked to be      discontinued while the patient was on Lortab.   Other discharge instructions per Dr. Leticia Penna.   DIET:  Regular as tolerated.   FOLLOW UP:  With Dr. Leticia Penna in 3 weeks.  PMD as needed.   Total time on this encounter 35 minutes.      Osvaldo Shipper, MD  Electronically Signed     GK/MEDQ  D:  04/03/2009  T:  04/03/2009  Job:  [id number]   cc:   Tilford Pillar, MD  Fax:  [phone]   Ernestina Penna, M.D.  Fax: [phone]

## 2011-03-10 NOTE — Op Note (Signed)
[name], [name]               ACCOUNT NO.:  [id number]   MEDICAL RECORD NO.:  [id number]          PATIENT TYPE:  INP   LOCATION:  A338                          FACILITY:  [facility]   PHYSICIAN:  Tilford Pillar, MD      DATE OF BIRTH:  [birth year]   DATE OF PROCEDURE:  04/03/2009  DATE OF DISCHARGE:  04/03/2009                               OPERATIVE REPORT   PREOPERATIVE DIAGNOSES:  Acute pancreatitis, suspected gallstone and  pancreatitis.   POSTOPERATIVE DIAGNOSES:  Acute pancreatitis, suspected gallstone and  pancreatitis.   PROCEDURE:  Laparoscopic cholecystectomy with intraoperative  cholangiogram intraoperative suspicion interpretation.   SURGEON:  Tilford Pillar, MD   ANESTHESIA:  General tracheal local anesthetic 0.5% Sensorcaine plain.   SPECIMEN:  Gallbladder.   ESTIMATED BLOOD LOSS:  Minimal.   INDICATIONS:  The patient is a 70 year old female who presented to [facility] with epigastric abdominal pain.  During her admission, she  was evaluated to have acute pancreatitis secondarily to laboratory  evaluation and symptomatology.  In addition, during her workup she was  noted to have cholelithiasis.  Upon resolution of the acute  pancreatitis, the risks, benefits, and alternatives of laparoscopic  possible open cholecystectomy were discussed at length with the patient  including intraoperative cholangiogram.  These risks include, but not  limited to risk of bleeding, infection, bile leak, small bowel injury,  common bile duct injury, as well as possibility of intraoperative  cardiac and pulmonary events.  The patient's questions and concerns were  addressed and the patient was consented for the planned procedure.   OPERATION:  The patient was taken to the operating suite, placed in  supine position on the operating table.  At this time, the general  anesthetic was administered.  Once the patient was asleep, she was  endotracheally intubated by Anesthesia and  her abdomen was prepped with  DuraPrep solution and draped in standard fashion.  A stab incision was  created supraumbilically.  An 11-blade scalpel was used to dissect down  through subcuticular tissue, was carried out using a Kocher clamp which  was utilized to grasp the anterior abdominal wall fascia and move this  anteriorly.  A Veress needle was inserted.  Saline drop test was  utilized to confirm intraperitoneal placement and 11-mm was inserted  over a laparoscope allowing visualization of trocar entering the  peritoneal cavity.  At this time, the inner cannula was removed.  The  laparoscope was reinserted.  There is no evidence of any trocar or  Veress needle placement injury.  At this time, the remaining trocars  were placed with 11-mm trocar in the epigastric, a 5-mm trocar between  the two 11-mm trocars and a 5-mm trocar in the right lateral abdominal  wall.  Patient was placed into a reverse Trendelenburg left lateral  decubitus position.  the fundus of the gallbladder was grasped and  lifted up and over the right lobe of the liver.  Blunt peritoneal  dissection was utilized to dissect the peritoneal reflection off the  infundibulum exposing the cystic duct entering into the infundibulum.  I  did create a window behind the cystic duct, one solitary Endoclip was  placed distally adjacent to the infundibulum as the cystic duct entered  into the infundibulum and then using Endoshears, a small defect was  created in the cystic duct.  Good bile return was noted.  A  cholangiogram catheter was then advanced and place through the small  defect.  Upon clamping and flushing, good flushing of saline was noted  without any extravasation.  At this time, Omnipaque radiographic  contrast was utilized to perform a cholangiogram using fluoroscopy.  No  evidence of any biliary stones were noted.  There was good flow into  both right and left hepatic duct, good flow into the duodenum.  At this   time, the increasing results of the fluoroscopy did proceed with the  cholecystectomy.   At this time, the cholangiogram catheter was removed.  The proximal  portion of the cystic duct was sealed using three Endoclips.  The  completion of dissection and division of the cystic duct was carried out  using the Endoshears.  Blunt dissection with [place] dissectors exposed  the cystic artery.  A window was created behind the cystic artery.  Three Endoclips were placed proximally, one distally, and cystic artery  was divided between two most distal clips.  Electrocautery was then  utilized to dissect the gallbladder free from the gallbladder fossa.  Once it was free, it was placed into an EndoCatch bag, was placed up and  over the right lobe of the liver.  Hemostasis was excellent and at this  time, copious amount of sterile saline was utilized to irrigate the  surgical field, returning aspirate was clear.  Inspection of the  surgical clips, noted these to be in excellent position.  There was no  evidence of any bile or bleeding.  At this time, I turned attention to  closure using a Endoclose suture passing device to pass 2-0 Vicryl  suture.  A 2-0 Vicryl suture was passed through both 11-mm trocar sites  with these sutures in place.  The gallbladder was grasped and was  removed through the umbilical trocar site, intact EndoCatch bag,  gallbladder was placed in the back table, as soon as the permanent  specimen to pathology.  At this time, a pneumoperitoneum was evacuated.  The trocars removed.  The 2-0 Vicryl sutures were secured.  Local  anesthetic was instilled.  A 4-0 Monocryl was utilized to reapproximate  the skin edges at all 4 trocar sites.  Skin was washed, dried with moist  and dry towel.  Benzoin was applied around the incision.  Half inch  Steri-Strips were placed.  The drapes were removed.  The patient was  allowed to come out of general anesthetic and was transferred back to   regular hospital bed.  She was transferred to postanesthetic care unit  in stable condition.  At the conclusion of the procedure, all  instrument, sponge, and needle counts were correct.  The patient  tolerated the procedure extremely well.      Tilford Pillar, MD  Electronically Signed     BZ/MEDQ  D:  04/03/2009  T:  04/04/2009  Job:  [id number]   cc:   Ernestina Penna, M.D.  Fax: [phone]

## 2011-03-10 NOTE — Assessment & Plan Note (Signed)
[facility]                             PULMONARY OFFICE NOTE   ANJOLIE, MAJER                      MRN:          [id number]  DATE:06/30/2007                            DOB:          Oct 26, 1941    [name] returns in followup, and has a history of moderate persistent  asthma, mild pulmonary fibrosis.  She is improved with decreased  shortness of breath, decreased cough.  She is maintaining Qvar 80 mcg 2  sprays daily and albuterol p.r.n. only.   EXAMINATION:  Temperature 97.  Blood pressure 110/68.  Pulse 65.  Saturation was 97% on room air.  CHEST:  Showed to be clear without evidence of wheeze or rhonchi.  CARDIAC EXAM:  Showed a regular rate and rhythm without S3.  Normal S1  and S2.  ABDOMEN:  Soft and nontender.  EXTREMITIES:  Showed no edema or clubbing.   IMPRESSION:  Is that of stable moderate persistent asthma.  Stable  pulmonary fibrosis.   PLAN:  Reduce Qvar to 40 mcg strength 2 sprays daily.  Continue  albuterol p.r.n.  We will see the patient back in return followup in 4  months.     Gabrielle Cradle Delford Field, MD, [facility]  Electronically Signed    PEW/MedQ  DD: 06/30/2007  DT: 06/30/2007  Job #: [id number]

## 2011-03-13 NOTE — Assessment & Plan Note (Signed)
[facility]                               PULMONARY OFFICE NOTE   MICHALENE, DEBRULER                      MRN:          [id number]  DATE:06/09/2006                            DOB:          Aug 06, 1941    [name] is a 70 year old white female with history of moderate  persistent asthma, mild pulmonary fibrosis. Overall she is doing well on  Qvar therapy 80 mcg two sprays b.i.d.  She has been off systemic steroids  since October 2006 with no adverse worsenings.   PHYSICAL EXAMINATION:  VITAL SIGNS:  Temperature 98, blood pressure 132/80,  pulse 66, saturation 96% on room air.  CHEST:  Dry rales at the bases.  Otherwise clear.  CARDIAC:  Regular rate and rhythm without S3.  Normal S1 and S2.  ABDOMEN:  Soft, nontender.  EXTREMITIES:  No edema or clubbing.  SKIN:  Clear.   IMPRESSION:  Stable idiopathic pulmonary fibrosis, mild lower airway  inflammation, stable on Qvar therapy.   PLAN:  Maintain Qvar as currently dosed.  Will see the patient back.  Return  in four months.                                   Charlcie Cradle Delford Field, MD, FCCP   PEW/MedQ  DD:  06/09/2006  DT:  06/09/2006  Job #:  [id number]

## 2011-03-13 NOTE — Op Note (Signed)
NAMECHIYEKO, FERRE               ACCOUNT NO.:  [id number]   MEDICAL RECORD NO.:  [id number]          PATIENT TYPE:  AMB   LOCATION:  ENDO                         FACILITY:  [facility]   PHYSICIAN:  Shan Levans, M.D. LHCDATE OF BIRTH:  [birth year]   DATE OF PROCEDURE:  05/13/2005  DATE OF DISCHARGE:                                 OPERATIVE REPORT   CHIEF COMPLAINT:  Evaluate for bilateral pulmonary infiltrates.  Evaluate  for pulmonary fibrosis.   PROCEDURE:  Bronchoscopy.   SURGEON:  Shan Levans, M.D.   ANESTHESIA:  Xylocaine 1% local.   PREOPERATIVE MEDICATION:  Demerol 40 mg IV and Versed 3 mg IV push.   DESCRIPTION OF PROCEDURE:  The Olympus video bronchoscope was introduced via  the right nares. The upper airways were visualized and were unremarkable.  The entire tracheobronchial tree was visualized and revealed no  endobronchial lesions except for minimal tracheobronchitis.  Attention was  then paid to the right middle lobe.  Bronchial biopsies x6 were obtained.  There were no complications, no bleeding seen.  Bronchial washings were  obtained.   COMPLICATIONS:  None.   IMPRESSION:  Bilateral, right greater than left, pulmonary infiltrates.  Evaluate for cause.   RECOMMENDATIONS:  Follow-up pathology and microbiology.       PW/MEDQ  D:  05/13/2005  T:  05/13/2005  Job:  [id number]   cc:   Kerry Kass, M.D. [facility]   Ernestina Penna, M.D.  [address] [place]  [place] [address]  Fax: [phone]

## 2011-03-13 NOTE — Assessment & Plan Note (Signed)
[facility]                             PULMONARY OFFICE NOTE   [name], [name]                      MRN:          [id number]  DATE:10/08/2006                            DOB:          [birth year]    [name] is a 70 year old white female with a history of moderate  persistent asthma. She has been doing well without need for use of her  albuterol. She is on Qvar 2 sprays b.i.d. 80 mcg strength.   PHYSICAL EXAMINATION:  VITAL SIGNS:  Temperature 97, blood pressure  116/80, pulse 71, saturation 96% on room air.  CHEST:  Showed to be completely clear without evidence of wheeze or  rhonchi.  CARDIAC:  Showed a regular rate and rhythm.  ABDOMEN:  Soft, nontender.  EXTREMITIES:  Showed no edema or clubbing.  SKIN:  Clear.   IMPRESSION:  Moderate persistent asthma stable at this time.   PLAN:  Reduce Qvar to 80 mcg strength 2 sprays daily, maintain albuterol  p.r.n. and will see the patient back in return followup in 4 months. A  full set of pulmonary functions will be obtained.     Gabrielle Cradle Delford Field, MD, [facility]  Electronically Signed    PEW/MedQ  DD: 10/08/2006  DT: 10/08/2006  Job #: [id number]

## 2011-03-13 NOTE — Assessment & Plan Note (Signed)
[facility]                             PULMONARY OFFICE NOTE   AMAYRANY, CAFARO                      MRN:          [id number]  DATE:02/02/2007                            DOB:          August 19, 1941    Ms. [name] is a 70 year old, white female with history of moderate  persistent asthma, doing well without need of her albuterol inhaler.  She is on QVAR two sprays daily, 80 mcg strength.  She has not required  her rescue inhaler on her recent visits.   PHYSICAL EXAMINATION:  VITAL SIGNS:  Temperature 97.  Blood pressure  120/76.  Pulse 71.  Saturation was 96 percent on room air.  CHEST:  Clear without evidence of adventitious breath sounds.  No  wheeze, rale, or rhonchi were noted.  CARDIAC:  Regular rate and rhythm without S3.  Normal S1, S2.  ABDOMEN:  Soft, nontender.  EXTREMITIES:  No edema or clubbing.  SKIN:  Clear.  NEUROLOGIC:  Intact.  HEENT:  No jugular venous distention.  No lymphadenopathy.  Oropharynx  clear.  Neck supple.   IMPRESSION:  Stable moderate persistent asthma.   PLAN:  Maintain QVAR as currently dosed.  Return this patient in four  months.     Charlcie Cradle Delford Field, MD, [facility]  Electronically Signed    PEW/MedQ  DD: 02/02/2007  DT: 02/02/2007  Job #: [id number]

## 2011-06-17 ENCOUNTER — Ambulatory Visit (INDEPENDENT_AMBULATORY_CARE_PROVIDER_SITE_OTHER): Payer: Medicare Other | Admitting: Critical Care Medicine

## 2011-06-17 ENCOUNTER — Encounter: Payer: Self-pay | Admitting: Critical Care Medicine

## 2011-06-17 DIAGNOSIS — J45909 Unspecified asthma, uncomplicated: Secondary | ICD-10-CM

## 2011-06-17 NOTE — Progress Notes (Signed)
Subjective:    Patient ID: [name], female    DOB: [birth year], 70 y.o.   MRN: [id number]  HPI   71 y.o.  WF with hx of moderate persistent asthma and pulm fibrosis idiopathic October 03, 2010 11:35 AM  Hard time getting deep breath. no real cough. no wheeze. no mucus. Had spell of vertigo but now better rx by PCP  No chest pain. Had flu vaccine   02/04/11  Last ov 12/11: no changes made at that time Now no new issues. No new complaints Pt denies any significant sore throat, nasal congestion or excess secretions, fever, chills, sweats, unintended weight loss, pleurtic or exertional chest pain, orthopnea PND, or leg swelling Pt denies any increase in rescue therapy over baseline, denies waking up needing it or having any early am or nocturnal exacerbations of coughing/wheezing/or dyspnea. Pt also denies any obvious fluctuation in symptoms with  weather or environmental change or other alleviating or aggravating factors  Pt had PFTs in 1/12: Pulmonary Function Test  Date: 11/05/2010  Height (in.): 60  Gender: Female  Pre-Spirometry  FVC Value: 2.84 L/min Pred: 2.42 L/min % Pred: 117 %  FEV1 Value: 1.31 L Pred: 1.71 L % Pred: 77 %  FEV1/FVC Value: 46 % Pred: 72 % FEF 25-75 Value: 0.35 L/min Pred: 2.11 L/min % Pred: 17 %  Post-Spirometry  FVC Value: 2.76 L/min Pred: 2.42 L/min % Pred: 114 %  FEV1 Value: 1.42 L Pred: 1.71 L % Pred: 83 %  FEV1/FVC Value: 51 % Pred: 72 % FEF 25-75 Value: 0.48 L/min Pred: 2.11 L/min % Pred: 23 %  Lung Volumes  TLC Value: 4.21 L % Pred: 104 %  RV Value: 1.37 L % Pred: 86 %  DLCO Value: 15.2 % % Pred: 86 %  DLCO/VA Value: 4.17 % % Pred: 118 %  Comments:  Stable lung volumes and DLCO, severe peripheral airflow obstruction   8/22 No additional issues since 4/11 ov No real cough except early am to clear throat.  No real chest pain.  No real wheeze.   Past Medical History  Diagnosis Date  . Postinflammatory pulmonary fibrosis   .  Hypothyroidism   . Asthma   . Gallstone pancreatitis      Family History  Problem Relation Age of Onset  . Heart failure Mother   . Emphysema Mother   . Other Father     blood clot in brain  . Breast cancer Sister   . Breast cancer Sister   . Asthma Sister      History   Social History  . Marital Status: Married    Spouse Name: N/A    Number of Children: 3  . Years of Education: N/A   Occupational History  . retired Scientist, product/process development    Social History Main Topics  . Smoking status: Never Smoker   . Smokeless tobacco: Never Used  . Alcohol Use: No  . Drug Use: No  . Sexually Active: Not on file   Other Topics Concern  . Not on file   Social History Narrative  . No narrative on file     Allergies  Allergen Reactions  . Sulfonamide Derivatives      Outpatient Prescriptions Prior to Visit  Medication Sig Dispense Refill  . albuterol (PROAIR HFA) 108 (90 BASE) MCG/ACT inhaler Inhale 2 puffs into the lungs every 6 (six) hours as needed.        Marland Kitchen aspirin 81 MG tablet Take 81 mg by  mouth daily.        . Calcium Carbonate (CALCIUM 600) 1500 MG TABS Take 1 tablet by mouth daily.        . fish oil-omega-3 fatty acids 1000 MG capsule Take 1 g by mouth daily.        Marland Kitchen levothyroxine (SYNTHROID, LEVOTHROID) 88 MCG tablet Take 88 mcg by mouth daily.        . Multiple Vitamins-Minerals (CENTRUM SILVER PO) Take 1 tablet by mouth daily.        . vitamin B-12 (CYANOCOBALAMIN) 1000 MCG tablet Take 1,000 mcg by mouth daily.        Marland Kitchen QVAR 40 MCG/ACT inhaler USE 2 PUFFS TWICE DAILY FOR 10 DAYS, THEN REDUCE TO 2 PUFFS ONCE DAILY  1 Inhaler  5      Review of Systems  Constitutional:   No  weight loss, night sweats,  Fevers, chills, fatigue, lassitude. HEENT:   No headaches,  Difficulty swallowing,  Tooth/dental problems,  Sore throat,                No sneezing, itching, ear ache, nasal congestion, post nasal drip,   CV:  No chest pain,  Orthopnea, PND, swelling in lower  extremities, anasarca, dizziness, palpitations  GI  No heartburn, indigestion, abdominal pain, nausea, vomiting, diarrhea, change in bowel habits, loss of appetite  Resp: Notes  shortness of breath with exertion not at rest.  No excess mucus, no productive cough,  No non-productive cough,  No coughing up of blood.  No change in color of mucus.  No wheezing.  No chest wall deformity  Skin: no rash or lesions.  GU: no dysuria, change in color of urine, no urgency or frequency.  No flank pain.  MS:  No joint pain or swelling.  No decreased range of motion.  No back pain.  Psych:  No change in mood or affect. No depression or anxiety.  No memory loss.     Objective:   Physical Exam  Gen: Pleasant, well-nourished, in no distress,  normal affect  ENT: No lesions,  mouth clear,  oropharynx clear, no postnasal drip  Neck: No JVD, no TMG, no carotid bruits  Lungs: No use of accessory muscles, no dullness to percussion, distant bs  Cardiovascular: RRR, heart sounds normal, no murmur or gallops, no peripheral edema  Abdomen: soft and NT, no HSM,  BS normal  Musculoskeletal: No deformities, no cyanosis or clubbing  Neuro: alert, non focal  Skin: Warm, no lesions or rashes        Assessment & Plan:   ASTHMA Stable moderate persistent asthma Plan No change in inhaled meds     Updated Medication List Outpatient Encounter Prescriptions as of 06/17/2011  Medication Sig Dispense Refill  . albuterol (PROAIR HFA) 108 (90 BASE) MCG/ACT inhaler Inhale 2 puffs into the lungs every 6 (six) hours as needed.        Marland Kitchen aspirin 81 MG tablet Take 81 mg by mouth daily.        . beclomethasone (QVAR) 40 MCG/ACT inhaler 2 puffs at bedtime.       . Calcium Carbonate (CALCIUM 600) 1500 MG TABS Take 1 tablet by mouth daily.        . fish oil-omega-3 fatty acids 1000 MG capsule Take 1 g by mouth daily.        Marland Kitchen levothyroxine (SYNTHROID, LEVOTHROID) 88 MCG tablet Take 88 mcg by mouth daily.          Marland Kitchen  Multiple Vitamins-Minerals (CENTRUM SILVER PO) Take 1 tablet by mouth daily.        . vitamin B-12 (CYANOCOBALAMIN) 1000 MCG tablet Take 1,000 mcg by mouth daily.        Marland Kitchen DISCONTD: QVAR 40 MCG/ACT inhaler USE 2 PUFFS TWICE DAILY FOR 10 DAYS, THEN REDUCE TO 2 PUFFS ONCE DAILY  1 Inhaler  5

## 2011-06-17 NOTE — Patient Instructions (Signed)
No change in medications. Return in        6 months        

## 2011-06-18 NOTE — Assessment & Plan Note (Signed)
Stable moderate persistent asthma Plan No change in inhaled meds

## 2011-09-22 ENCOUNTER — Encounter (HOSPITAL_COMMUNITY): Payer: Self-pay

## 2011-09-22 ENCOUNTER — Encounter (HOSPITAL_COMMUNITY)
Admission: RE | Admit: 2011-09-22 | Discharge: 2011-09-22 | Disposition: A | Discharge status: Home / Self Care | Payer: Medicare Other | Source: Ambulatory Visit | Attending: Ophthalmology | Admitting: Ophthalmology

## 2011-09-22 ENCOUNTER — Encounter (HOSPITAL_COMMUNITY): Payer: Self-pay | Admitting: Pharmacy Technician

## 2011-09-22 ENCOUNTER — Other Ambulatory Visit: Payer: Self-pay

## 2011-09-22 LAB — BASIC METABOLIC PANEL
BUN: 12 mg/dL (ref 6–23)
Calcium: 9.6 mg/dL (ref 8.4–10.5)
Chloride: 107 mEq/L (ref 96–112)
Creatinine, Ser: 0.75 mg/dL (ref 0.50–1.10)
GFR calc Af Amer: >90 mL/min (ref >90)

## 2011-09-22 LAB — CBC
HCT: 39 % (ref 36.0–46.0)
MCH: 30.5 pg (ref 26.0–34.0)
MCV: 92.9 fL (ref 78.0–100.0)
RDW: 13.5 % (ref 11.5–15.5)
WBC: 7 10*3/uL (ref 4.0–10.5)

## 2011-09-22 NOTE — Patient Instructions (Addendum)
20 [name]  09/22/2011   Your procedure is scheduled on:  10/01/2011  Report to [facility] at 07:40 AM.  Call this number if you have problems the morning of surgery: [phone]   Remember:   Do not eat food:After Midnight.  May have clear liquids:until Midnight .  Clear liquids include soda, tea, black coffee, apple or grape juice, broth.  Take these medicines the morning of surgery with A SIP OF WATER: Synthroid. Also, take your inhalers, Proair and Qvar.   Do not wear jewelry, make-up or nail polish.  Do not wear lotions, powders, or perfumes. You may wear deodorant.  Do not shave 48 hours prior to surgery.  Do not bring valuables to the hospital.  Contacts, dentures or bridgework may not be worn into surgery.  Leave suitcase in the car. After surgery it may be brought to your room.  For patients admitted to the hospital, checkout time is 11:00 AM the day of discharge.   Patients discharged the day of surgery will not be allowed to drive home.  Name and phone number of your driver:   Special Instructions: N/A   Please read over the following fact sheets that you were given: Anesthesia Post-op Instructions     Cataract A cataract is a clouding of the lens of the eye. It is most often related to aging. A cataract is not a "film" over the surface of the eye. The lens is inside the eye and changes size of the pupil. The lens can enlarge to let more light enter the eye in dark environments and contract the size of the pupil to let in bright light. The lens is the part of the eye that helps focus light on the retina. The retina is the eye's light-sensitive layer. It is in the back of the eye that sends visual signals to the brain. In a normal eye, light passes through the lens and gets focused on the retina. To help produce a sharp image, the lens must remain clear. When a lens becomes cloudy, vision is compromised by the degree and nature of the clouding. Certain cataracts make  people more near-sighted as they develop, others increase glare, and all reduce vision to some degree or another. A cataract that is so dense that it becomes milky [name] and a [name] opacity can be seen through the pupil. When the [name] color is seen, it is called a "mature" or "hyper-mature cataract." Such cataracts cause total blindness in the affected eye. The cataract must be removed to prevent damage to the eye itself. Some types of cataracts can cause a secondary disease of the eye, such as certain types of glaucoma. In the early stages, better lighting and eyeglasses may lessen vision problems caused by cataracts. At a certain point, surgery may be needed to improve vision. CAUSES   Aging. However, cataracts may occur at any age, even in newborns.   Certain drugs.   Trauma to the eye.   Certain diseases (such as diabetes).   Inherited or acquired medical syndromes.  SYMPTOMS   Gradual, progressive drop in vision in the affected eye. Cataracts may develop at different rates in each eye. Cataracts may even be in just one eye with the other unaffected.   Cataracts due to trauma may develop quickly, sometimes over a matter or days or even hours. The result is severe and rapid visual loss.  DIAGNOSIS  To detect a cataract, an eye doctor examines the lens. A well developed  cataract can be diagnosed without dilating the pupil. Early cataracts and others of a specific nature are best diagnosed with an exam of the eyes with the pupils dilated by drops. TREATMENT   For an early cataract, vision may improve by using different eyeglasses or stronger lighting.   If the above measures do not help, surgery is the only effective treatment. This treatment removes the cloudy lens and replaces it with a substitute lens (Intraocular lens, or IOL). Newly developed IOL technology allows the implanted lens to improve vision both at a distance and up close. Discuss with your eye surgeon about the possibility  of still needing glasses. Also discuss how visual coordination between both eyes will be affected.  A cataract needs to be removed only when vision loss interferes with your everyday activities such as driving, reading or watching TV. You and your eye doctor can make that decision together. In most cases, waiting until you are ready to have cataract surgery will not harm your eye. If you have cataracts in both eyes, only one should be removed at a time. This allows the operated eye to heal and be out of danger from serious problems (such as infection or poor wound healing) before having the other eye undergo surgery.  Sometimes, a cataract should be removed even if it does not cause problems with your vision. For example, a cataract should be removed if it prevents examination or treatment of another eye problem. Just as you cannot see out of the affected eye well, your doctor cannot see into your eye well through a cataract. The vast majority of people who have cataract surgery have better vision afterward. CATARACT REMOVAL There are two primary ways to remove a cataract. Your doctor can explain the differences and help determine which is best for you:  Phacoemulsification (small incision cataract surgery). This involves making a small cut (incision) on the edge of the clear, dome-shaped surface that covers the front of the eye (the cornea). An injection behind the eye or eye drops are given to make this a painless procedure. The doctor then inserts a tiny probe into the eye. This device emits ultrasound waves that soften and break up the cloudy center of the lens so it can be removed by suction. Most cataract surgery is done this way. The cuts are usually so small and performed in such a manner that often no sutures are needed to keep it closed.   Extracapsular surgery. Your doctor makes a slightly longer incision on the side of the cornea. The doctor removes the hard center of the lens. The remainder of  the lens is then removed by suction. In some cases, extremely fine sutures are needed which the doctor may, or may not remove in the office after the surgery.  When an IOL is implanted, it needs no care. It becomes a permanent part of your eye and cannot be seen or felt.  Some people cannot have an IOL. They may have problems during surgery, or maybe they have another eye disease. For these people, a soft contact lens may be suggested. If an IOL or contact lens cannot be used, very powerful and thick glasses are required after surgery. Since vision is very different through such thick glasses, it is important to have your doctor discuss the impact on your vision after any cataract surgery where there is no plan to implant an IOL. The normal lens of the eye is covered by a clear capsule. Both phacoemulsification and extracapsular  surgery require that the back surface of this lens capsule be left in place. This helps support IOLs and prevents the IOL from dislocating and falling back into the deeper interior of the eye. Right after surgery, and often permanently this "posterior capsule" remains clear. In some cases however, it can become cloudy, presenting the same type of visual compromise that the original cataract did since light is again obstructed as it passes through the clear IOL. This condition is often referred to as an "after-cataract." Fortunately, after-cataracts are easily treated using a painless and very fast laser treatment that is performed without anesthesia or incisions. It is done in a matter of minutes in an outpatient environment. Visual improvement is often immediate.  HOME CARE INSTRUCTIONS   Your surgeon will discuss pre and post operative care with you prior to surgery. The majority of people are able to do almost all normal activities right away. Although, it is often advised to avoid strenuous activity for a period of time.   Postoperative drops and careful avoidance of infection  will be needed. Many surgeons suggest the use of a protective shield during the first few days after surgery.   There is a very small incidence of complication from modern cataract surgery, but it can happen. Infection that spreads to the inside of the eye (endophthalmitis) can result in total visual loss and even loss of the eye itself. In extremely rare instances, the inflammation of endophthalmitis can spread to both eyes (sympathetic ophthalmia). Appropriate post-operative care under the close observation of your surgeon is essential to a successful outcome.  SEEK IMMEDIATE MEDICAL CARE IF:   You have any sudden drop of vision in the operated eye.   You have pain in the operated eye.   You see a large number of floating dots in the field of vision in the operated eye.   You see flashing lights, or if a portion of your side vision in any direction appears black (like a curtain being drawn into your field of vision) in the operated eye.  Document Released: 10/12/2005 Document Revised: 06/24/2011 Document Reviewed: 11/28/2007 [facility] Patient Information 2012 [place], [place].      PATIENT INSTRUCTIONS POST-ANESTHESIA  IMMEDIATELY FOLLOWING SURGERY:  Do not drive or operate machinery for the first twenty four hours after surgery.  Do not make any important decisions for twenty four hours after surgery or while taking narcotic pain medications or sedatives.  If you develop intractable nausea and vomiting or a severe headache please notify your doctor immediately.  FOLLOW-UP:  Please make an appointment with your surgeon as instructed. You do not need to follow up with anesthesia unless specifically instructed to do so.  WOUND CARE INSTRUCTIONS (if applicable):  Keep a dry clean dressing on the anesthesia/puncture wound site if there is drainage.  Once the wound has quit draining you may leave it open to air.  Generally you should leave the bandage intact for twenty four hours unless there is  drainage.  If the epidural site drains for more than 36-48 hours please call the anesthesia department.  QUESTIONS?:  Please feel free to call your physician or the hospital operator if you have any questions, and they will be happy to assist you.     [facility] Anesthesia Department [address] [place] [phone]

## 2011-10-01 ENCOUNTER — Encounter (HOSPITAL_COMMUNITY)
Admission: RE | Disposition: A | Discharge status: Home / Self Care | Payer: Self-pay | Source: Ambulatory Visit | Attending: Ophthalmology

## 2011-10-01 ENCOUNTER — Encounter (HOSPITAL_COMMUNITY): Payer: Self-pay | Admitting: *Deleted

## 2011-10-01 ENCOUNTER — Ambulatory Visit (HOSPITAL_COMMUNITY)
Admission: RE | Admit: 2011-10-01 | Discharge: 2011-10-01 | Disposition: A | Discharge status: Home / Self Care | Payer: Medicare Other | Source: Ambulatory Visit | Attending: Ophthalmology | Admitting: Ophthalmology

## 2011-10-01 ENCOUNTER — Encounter (HOSPITAL_COMMUNITY): Payer: Self-pay | Admitting: Anesthesiology

## 2011-10-01 ENCOUNTER — Ambulatory Visit (HOSPITAL_COMMUNITY): Payer: Medicare Other | Admitting: Anesthesiology

## 2011-10-01 DIAGNOSIS — Z7982 Long term (current) use of aspirin: Secondary | ICD-10-CM | POA: Insufficient documentation

## 2011-10-01 DIAGNOSIS — Z01812 Encounter for preprocedural laboratory examination: Secondary | ICD-10-CM | POA: Insufficient documentation

## 2011-10-01 DIAGNOSIS — H2589 Other age-related cataract: Secondary | ICD-10-CM | POA: Insufficient documentation

## 2011-10-01 DIAGNOSIS — Z01810 Encounter for preprocedural cardiovascular examination: Secondary | ICD-10-CM | POA: Insufficient documentation

## 2011-10-01 HISTORY — PX: CATARACT EXTRACTION W/PHACO: SHX586

## 2011-10-01 SURGERY — PHACOEMULSIFICATION, CATARACT, WITH IOL INSERTION
Anesthesia: Monitor Anesthesia Care | Site: Eye | Laterality: Left | Wound class: Clean

## 2011-10-01 MED ORDER — POVIDONE-IODINE 5 % OP SOLN
OPHTHALMIC | Status: DC | PRN
  Administered 2011-10-01: 1 via OPHTHALMIC

## 2011-10-01 MED ORDER — PHENYLEPHRINE HCL 2.5 % OP SOLN
OPHTHALMIC | Status: AC
  Administered 2011-10-01: 1 [drp] via OPHTHALMIC
  Filled 2011-10-01: qty 2

## 2011-10-01 MED ORDER — LIDOCAINE HCL (PF) 1 % IJ SOLN
INTRAMUSCULAR | Status: DC | PRN
  Administered 2011-10-01: .3 mL

## 2011-10-01 MED ORDER — LIDOCAINE HCL 3.5 % OP GEL
1.0000 "application " | Freq: Once | OPHTHALMIC | Status: AC
  Administered 2011-10-01: 1 via OPHTHALMIC

## 2011-10-01 MED ORDER — EPINEPHRINE HCL 1 MG/ML IJ SOLN
INTRAOCULAR | Status: DC | PRN
  Administered 2011-10-01: 09:00:00

## 2011-10-01 MED ORDER — MIDAZOLAM HCL 2 MG/2ML IJ SOLN
1.0000 mg | INTRAMUSCULAR | Status: DC | PRN
  Administered 2011-10-01: 2 mg via INTRAVENOUS

## 2011-10-01 MED ORDER — NEOMYCIN-POLYMYXIN-DEXAMETH 3.5-10000-0.1 OP OINT
TOPICAL_OINTMENT | OPHTHALMIC | Status: AC
  Filled 2011-10-01: qty 3.5

## 2011-10-01 MED ORDER — CYCLOPENTOLATE-PHENYLEPHRINE 0.2-1 % OP SOLN
OPHTHALMIC | Status: AC
  Administered 2011-10-01: 1 [drp] via OPHTHALMIC
  Filled 2011-10-01: qty 2

## 2011-10-01 MED ORDER — LIDOCAINE 3.5 % OP GEL OPTIME - NO CHARGE
OPHTHALMIC | Status: DC | PRN
  Administered 2011-10-01: 1 [drp] via OPHTHALMIC

## 2011-10-01 MED ORDER — BSS IO SOLN
INTRAOCULAR | Status: DC | PRN
  Administered 2011-10-01: 15 mL via INTRAOCULAR

## 2011-10-01 MED ORDER — LACTATED RINGERS IV SOLN
INTRAVENOUS | Status: DC
  Administered 2011-10-01: 09:00:00 via INTRAVENOUS

## 2011-10-01 MED ORDER — PROVISC 10 MG/ML IO SOLN
INTRAOCULAR | Status: DC | PRN
  Administered 2011-10-01: 8.5 mg via INTRAOCULAR

## 2011-10-01 MED ORDER — MIDAZOLAM HCL 2 MG/2ML IJ SOLN
INTRAMUSCULAR | Status: AC
  Administered 2011-10-01: 2 mg via INTRAVENOUS
  Filled 2011-10-01: qty 2

## 2011-10-01 MED ORDER — LIDOCAINE HCL (PF) 1 % IJ SOLN
INTRAMUSCULAR | Status: AC
  Filled 2011-10-01: qty 2

## 2011-10-01 MED ORDER — NEOMYCIN-POLYMYXIN-DEXAMETH 0.1 % OP OINT
TOPICAL_OINTMENT | OPHTHALMIC | Status: DC | PRN
  Administered 2011-10-01: 1 via OPHTHALMIC

## 2011-10-01 MED ORDER — TETRACAINE HCL 0.5 % OP SOLN
1.0000 [drp] | OPHTHALMIC | Status: AC
  Administered 2011-10-01 (×3): 1 [drp] via OPHTHALMIC

## 2011-10-01 MED ORDER — CYCLOPENTOLATE-PHENYLEPHRINE 0.2-1 % OP SOLN
1.0000 [drp] | OPHTHALMIC | Status: AC
  Administered 2011-10-01 (×3): 1 [drp] via OPHTHALMIC

## 2011-10-01 MED ORDER — PHENYLEPHRINE HCL 2.5 % OP SOLN
1.0000 [drp] | OPHTHALMIC | Status: AC
  Administered 2011-10-01 (×3): 1 [drp] via OPHTHALMIC

## 2011-10-01 MED ORDER — EPINEPHRINE HCL 1 MG/ML IJ SOLN
INTRAMUSCULAR | Status: AC
  Filled 2011-10-01: qty 1

## 2011-10-01 MED ORDER — LIDOCAINE HCL 3.5 % OP GEL
OPHTHALMIC | Status: AC
  Administered 2011-10-01: 1 via OPHTHALMIC
  Filled 2011-10-01: qty 5

## 2011-10-01 MED ORDER — TETRACAINE HCL 0.5 % OP SOLN
OPHTHALMIC | Status: AC
  Administered 2011-10-01: 1 [drp] via OPHTHALMIC
  Filled 2011-10-01: qty 2

## 2011-10-01 SURGICAL SUPPLY — 32 items

## 2011-10-01 NOTE — H&P (Signed)
I have reviewed the H&P, the patient was re-examined, and I have identified no interval changes in medical condition and plan of care since the history and physical of record  

## 2011-10-01 NOTE — Transfer of Care (Signed)
Immediate Anesthesia Transfer of Care Note  Patient: [name]  Procedure(s) Performed:  CATARACT EXTRACTION PHACO AND INTRAOCULAR LENS PLACEMENT (IOC) - CDE:13.74  Patient Location: PACU and Short Stay  Anesthesia Type: MAC  Level of Consciousness: awake, alert , oriented and patient cooperative  Airway & Oxygen Therapy: Patient Spontanous Breathing  Post-op Assessment: Report given to PACU RN, Post -op Vital signs reviewed and stable and Patient moving all extremities X 4  Post vital signs: Reviewed and stable  Complications: No apparent anesthesia complications

## 2011-10-01 NOTE — Anesthesia Postprocedure Evaluation (Signed)
  Anesthesia Post-op Note  Patient: [name]  Procedure(s) Performed:  CATARACT EXTRACTION PHACO AND INTRAOCULAR LENS PLACEMENT (IOC) - CDE:13.74  Patient Location: PACU and Short Stay  Anesthesia Type: MAC  Level of Consciousness: awake, alert , oriented and patient cooperative  Airway and Oxygen Therapy: Patient Spontanous Breathing  Post-op Pain: none  Post-op Assessment: Post-op Vital signs reviewed, Patient's Cardiovascular Status Stable, Respiratory Function Stable, Patent Airway and No signs of Nausea or vomiting  Post-op Vital Signs: Reviewed and stable  Complications: No apparent anesthesia complications

## 2011-10-01 NOTE — Brief Op Note (Signed)
Pre-Op Dx: Cataract OS Post-Op Dx: Cataract OS Surgeon: Shahrzad Koble Anesthesia: Topical with MAC Implant: Lenstec, Model Softec HD Specimen: None Complications: None 

## 2011-10-01 NOTE — Anesthesia Preprocedure Evaluation (Addendum)
Anesthesia Evaluation  Patient identified by MRN, date of birth, ID band Patient awake    Reviewed: Allergy & Precautions, H&P , NPO status , Patient's Chart, lab work & pertinent test results  Airway Mallampati: I      Dental  (+) Partial Lower   Pulmonary asthma ,  Postinflammatory pulmonary fibrosis   + decreased breath sounds      Cardiovascular neg cardio ROS Regular Normal    Neuro/Psych    GI/Hepatic   Endo/Other  Hypothyroidism   Renal/GU      Musculoskeletal   Abdominal   Peds  Hematology   Anesthesia Other Findings   Reproductive/Obstetrics                           Anesthesia Physical Anesthesia Plan  ASA: III  Anesthesia Plan: MAC   Post-op Pain Management:    Induction:   Airway Management Planned: Nasal Cannula  Additional Equipment:   Intra-op Plan:   Post-operative Plan:   Informed Consent: I have reviewed the patients History and Physical, chart, labs and discussed the procedure including the risks, benefits and alternatives for the proposed anesthesia with the patient or authorized representative who has indicated his/her understanding and acceptance.     Plan Discussed with:   Anesthesia Plan Comments:         Anesthesia Quick Evaluation

## 2011-10-01 NOTE — Op Note (Signed)
NAMETINLEE, NAVARRETTE               ACCOUNT NO.:  [id number]  MEDICAL RECORD NO.:  [id number]  LOCATION:  [facility]                          FACILITY:  [facility]  PHYSICIAN:  Susanne Greenhouse, MD       DATE OF BIRTH:  02-12-1941  DATE OF PROCEDURE:  10/01/2011 DATE OF DISCHARGE:  10/01/2011                              OPERATIVE REPORT   PREOPERATIVE DIAGNOSIS:  Combined cataract, left eye, diagnosis code 366.19.  POSTOPERATIVE DIAGNOSIS:  Combined cataract, left eye, diagnosis code 366.19.  OPERATION PERFORMED:  Phacoemulsification with posterior chamber intraocular lens implantation, left eye.  SURGEON:  Bonne Dolores. Pollyanna Levay, MD  ANESTHESIA:  General endotracheal anesthesia.  OPERATIVE SUMMARY:  In the preoperative area, dilating drops were placed into the left eye.  The patient was then brought into the operating room where she was placed under general anesthesia.  The eye was then prepped and draped.  Beginning with a 75 blade, a paracentesis port was made at the surgeon's 2 o'clock position.  The anterior chamber was then filled with a 1% nonpreserved lidocaine solution with epinephrine.  This was followed by Viscoat to deepen the chamber.  A small fornix-based peritomy was performed superiorly.  Next, a single iris hook was placed through the limbus superiorly.  A 2.4-mm keratome blade was then used to make a clear corneal incision over the iris hook.  A bent cystotome needle and Utrata forceps were used to create a continuous tear capsulotomy.  Hydrodissection was performed using balanced salt solution on a fine cannula.  The lens nucleus was then removed using phacoemulsification in a quadrant cracking technique.  The cortical material was then removed with irrigation and aspiration.  The capsular bag and anterior chamber were refilled with Provisc.  The wound was widened to approximately 3 mm and a posterior chamber intraocular lens was placed into the capsular bag without difficulty  using an [employer] lens injecting system.  A single 10-0 nylon suture was then used to close the incision as well as stromal hydration.  The Provisc was removed from the anterior chamber and capsular bag with irrigation and aspiration.  At this point, the wounds were tested for leak, which were negative.  The anterior chamber remained deep and stable.  The patient tolerated the procedure well.  There were no operative complications, and she awoke from general anesthesia without problem.  No surgical specimens.  Prosthetic device used is a Lenstec posterior chamber lens, model Softec HD, power of 20.75, serial number is [id number].          ______________________________ Susanne Greenhouse, MD     KEH/MEDQ  D:  10/01/2011  T:  10/01/2011  Job:  [id number]

## 2011-10-08 ENCOUNTER — Encounter (HOSPITAL_COMMUNITY): Payer: Self-pay | Admitting: Ophthalmology

## 2011-10-13 ENCOUNTER — Encounter (HOSPITAL_COMMUNITY)
Admission: RE | Admit: 2011-10-13 | Discharge: 2011-10-13 | Disposition: A | Discharge status: Home / Self Care | Payer: Medicare Other | Source: Ambulatory Visit | Attending: Ophthalmology | Admitting: Ophthalmology

## 2011-10-13 MED ORDER — ONDANSETRON HCL 4 MG/2ML IJ SOLN: 4.0000 mg | Freq: Once | INTRAMUSCULAR | Status: DC | PRN

## 2011-10-13 MED ORDER — FENTANYL CITRATE 0.05 MG/ML IJ SOLN: 25.0000 ug | INTRAMUSCULAR | Status: DC | PRN

## 2011-10-21 ENCOUNTER — Encounter (HOSPITAL_COMMUNITY): Payer: Self-pay

## 2011-10-22 ENCOUNTER — Encounter (HOSPITAL_COMMUNITY): Payer: Self-pay | Admitting: Anesthesiology

## 2011-10-22 ENCOUNTER — Encounter (HOSPITAL_COMMUNITY)
Admission: RE | Disposition: A | Discharge status: Home / Self Care | Payer: Self-pay | Source: Ambulatory Visit | Attending: Ophthalmology

## 2011-10-22 ENCOUNTER — Ambulatory Visit (HOSPITAL_COMMUNITY): Payer: Medicare Other | Admitting: Anesthesiology

## 2011-10-22 ENCOUNTER — Ambulatory Visit (HOSPITAL_COMMUNITY)
Admission: RE | Admit: 2011-10-22 | Discharge: 2011-10-22 | Disposition: A | Discharge status: Home / Self Care | Payer: Medicare Other | Source: Ambulatory Visit | Attending: Ophthalmology | Admitting: Ophthalmology

## 2011-10-22 ENCOUNTER — Encounter (HOSPITAL_COMMUNITY): Payer: Self-pay | Admitting: *Deleted

## 2011-10-22 DIAGNOSIS — H2589 Other age-related cataract: Secondary | ICD-10-CM | POA: Insufficient documentation

## 2011-10-22 HISTORY — PX: CATARACT EXTRACTION W/PHACO: SHX586

## 2011-10-22 SURGERY — PHACOEMULSIFICATION, CATARACT, WITH IOL INSERTION
Anesthesia: Monitor Anesthesia Care | Site: Eye | Laterality: Right | Wound class: Clean

## 2011-10-22 MED ORDER — PROVISC 10 MG/ML IO SOLN
INTRAOCULAR | Status: DC | PRN
  Administered 2011-10-22: 8.5 mg via INTRAOCULAR

## 2011-10-22 MED ORDER — NEOMYCIN-POLYMYXIN-DEXAMETH 3.5-10000-0.1 OP SUSP
OPHTHALMIC | Status: AC
  Filled 2011-10-22: qty 5

## 2011-10-22 MED ORDER — CYCLOPENTOLATE-PHENYLEPHRINE 0.2-1 % OP SOLN
1.0000 [drp] | OPHTHALMIC | Status: AC
  Administered 2011-10-22 (×3): 1 [drp] via OPHTHALMIC

## 2011-10-22 MED ORDER — NEOMYCIN-POLYMYXIN-DEXAMETH 0.1 % OP OINT
TOPICAL_OINTMENT | OPHTHALMIC | Status: DC | PRN
  Administered 2011-10-22: 1 via OPHTHALMIC

## 2011-10-22 MED ORDER — PHENYLEPHRINE HCL 2.5 % OP SOLN
1.0000 [drp] | OPHTHALMIC | Status: AC
  Administered 2011-10-22 (×3): 1 [drp] via OPHTHALMIC

## 2011-10-22 MED ORDER — LIDOCAINE HCL (PF) 1 % IJ SOLN
INTRAMUSCULAR | Status: AC
  Filled 2011-10-22: qty 2

## 2011-10-22 MED ORDER — POVIDONE-IODINE 5 % OP SOLN
OPHTHALMIC | Status: DC | PRN
  Administered 2011-10-22: 1 via OPHTHALMIC

## 2011-10-22 MED ORDER — TETRACAINE HCL 0.5 % OP SOLN
OPHTHALMIC | Status: AC
  Administered 2011-10-22: 1 [drp] via OPHTHALMIC
  Filled 2011-10-22: qty 2

## 2011-10-22 MED ORDER — MIDAZOLAM HCL 2 MG/2ML IJ SOLN
INTRAMUSCULAR | Status: AC
  Administered 2011-10-22: 2 mg via INTRAVENOUS
  Filled 2011-10-22: qty 2

## 2011-10-22 MED ORDER — LACTATED RINGERS IV SOLN
INTRAVENOUS | Status: DC
  Administered 2011-10-22: 1000 mL via INTRAVENOUS

## 2011-10-22 MED ORDER — EPINEPHRINE HCL 1 MG/ML IJ SOLN
INTRAOCULAR | Status: DC | PRN
  Administered 2011-10-22: 11:00:00

## 2011-10-22 MED ORDER — LIDOCAINE HCL 3.5 % OP GEL
1.0000 "application " | Freq: Once | OPHTHALMIC | Status: AC
  Administered 2011-10-22: 1 via OPHTHALMIC

## 2011-10-22 MED ORDER — NEOMYCIN-POLYMYXIN-DEXAMETH 3.5-10000-0.1 OP OINT
TOPICAL_OINTMENT | OPHTHALMIC | Status: AC
  Filled 2011-10-22: qty 3.5

## 2011-10-22 MED ORDER — LIDOCAINE 3.5 % OP GEL OPTIME - NO CHARGE
OPHTHALMIC | Status: DC | PRN
  Administered 2011-10-22: 1 [drp] via OPHTHALMIC

## 2011-10-22 MED ORDER — MIDAZOLAM HCL 2 MG/2ML IJ SOLN
1.0000 mg | INTRAMUSCULAR | Status: DC | PRN
  Administered 2011-10-22: 2 mg via INTRAVENOUS

## 2011-10-22 MED ORDER — PHENYLEPHRINE HCL 2.5 % OP SOLN
OPHTHALMIC | Status: AC
  Administered 2011-10-22: 1 [drp] via OPHTHALMIC
  Filled 2011-10-22: qty 2

## 2011-10-22 MED ORDER — EPINEPHRINE HCL 1 MG/ML IJ SOLN
INTRAMUSCULAR | Status: AC
  Filled 2011-10-22: qty 1

## 2011-10-22 MED ORDER — CYCLOPENTOLATE-PHENYLEPHRINE 0.2-1 % OP SOLN
OPHTHALMIC | Status: AC
  Administered 2011-10-22: 1 [drp] via OPHTHALMIC
  Filled 2011-10-22: qty 2

## 2011-10-22 MED ORDER — LIDOCAINE HCL 3.5 % OP GEL
OPHTHALMIC | Status: AC
  Administered 2011-10-22: 1 via OPHTHALMIC
  Filled 2011-10-22: qty 5

## 2011-10-22 MED ORDER — LIDOCAINE HCL (PF) 1 % IJ SOLN
INTRAMUSCULAR | Status: DC | PRN
  Administered 2011-10-22: .4 mL

## 2011-10-22 MED ORDER — TETRACAINE HCL 0.5 % OP SOLN
1.0000 [drp] | OPHTHALMIC | Status: AC
  Administered 2011-10-22 (×3): 1 [drp] via OPHTHALMIC

## 2011-10-22 MED ORDER — BSS IO SOLN
INTRAOCULAR | Status: DC | PRN
  Administered 2011-10-22: 15 mL via INTRAOCULAR

## 2011-10-22 SURGICAL SUPPLY — 32 items
CAPSULAR TENSION RING-AMO (OPHTHALMIC RELATED) IMPLANT
CLOTH BEACON ORANGE TIMEOUT ST (SAFETY) ×2 IMPLANT
EYE SHIELD UNIVERSAL CLEAR (GAUZE/BANDAGES/DRESSINGS) ×2 IMPLANT
GLOVE BIO SURGEON STRL SZ 6.5 (GLOVE) IMPLANT
GLOVE BIOGEL PI IND STRL 6.5 (GLOVE) IMPLANT
GLOVE BIOGEL PI IND STRL 7.0 (GLOVE) ×1 IMPLANT
GLOVE BIOGEL PI IND STRL 7.5 (GLOVE) IMPLANT
GLOVE BIOGEL PI INDICATOR 6.5 (GLOVE)
GLOVE BIOGEL PI INDICATOR 7.0 (GLOVE) ×1
GLOVE BIOGEL PI INDICATOR 7.5 (GLOVE)
GLOVE ECLIPSE 6.5 STRL STRAW (GLOVE) IMPLANT
GLOVE ECLIPSE 7.0 STRL STRAW (GLOVE) IMPLANT
GLOVE ECLIPSE 7.5 STRL STRAW (GLOVE) IMPLANT
GLOVE EXAM NITRILE LRG STRL (GLOVE) IMPLANT
GLOVE EXAM NITRILE MD LF STRL (GLOVE) ×2 IMPLANT
GLOVE SKINSENSE NS SZ6.5 (GLOVE)
GLOVE SKINSENSE NS SZ7.0 (GLOVE)
GLOVE SKINSENSE STRL SZ6.5 (GLOVE) IMPLANT
GLOVE SKINSENSE STRL SZ7.0 (GLOVE) IMPLANT
KIT VITRECTOMY (OPHTHALMIC RELATED) IMPLANT
PAD ARMBOARD 7.5X6 YLW CONV (MISCELLANEOUS) ×2 IMPLANT
PROC W NO LENS (INTRAOCULAR LENS)
PROC W SPEC LENS (INTRAOCULAR LENS)
PROCESS W NO LENS (INTRAOCULAR LENS) IMPLANT
PROCESS W SPEC LENS (INTRAOCULAR LENS) IMPLANT
RING MALYGIN (MISCELLANEOUS) IMPLANT
SIGHTPATH CAT PROC W REG LENS (Ophthalmic Related) ×2 IMPLANT
SYR TB 1ML LL NO SAFETY (SYRINGE) ×2 IMPLANT
TAPE SURG TRANSPORE 1 IN (GAUZE/BANDAGES/DRESSINGS) ×1 IMPLANT
TAPE SURGICAL TRANSPORE 1 IN (GAUZE/BANDAGES/DRESSINGS) ×1
VISCOELASTIC ADDITIONAL (OPHTHALMIC RELATED) IMPLANT
WATER STERILE IRR 250ML POUR (IV SOLUTION) ×2 IMPLANT

## 2011-10-22 NOTE — Anesthesia Preprocedure Evaluation (Signed)
Anesthesia Evaluation  Patient identified by MRN, date of birth, ID band Patient awake    Reviewed: Allergy & Precautions, H&P , NPO status , Patient's Chart, lab work & pertinent test results  Airway Mallampati: I      Dental  (+) Partial Lower   Pulmonary asthma ,  Postinflammatory pulmonary fibrosis   + decreased breath sounds      Cardiovascular neg cardio ROS Regular Normal    Neuro/Psych    GI/Hepatic   Endo/Other  Hypothyroidism   Renal/GU      Musculoskeletal   Abdominal   Peds  Hematology   Anesthesia Other Findings   Reproductive/Obstetrics                           Anesthesia Physical Anesthesia Plan  ASA: III  Anesthesia Plan: MAC   Post-op Pain Management:    Induction: Intravenous  Airway Management Planned: Nasal Cannula  Additional Equipment:   Intra-op Plan:   Post-operative Plan:   Informed Consent: I have reviewed the patients History and Physical, chart, labs and discussed the procedure including the risks, benefits and alternatives for the proposed anesthesia with the patient or authorized representative who has indicated his/her understanding and acceptance.     Plan Discussed with:   Anesthesia Plan Comments:         Anesthesia Quick Evaluation

## 2011-10-22 NOTE — H&P (Signed)
I have reviewed the H&P, the patient was re-examined, and I have identified no interval changes in medical condition and plan of care since the history and physical of record  

## 2011-10-22 NOTE — Anesthesia Postprocedure Evaluation (Signed)
  Anesthesia Post-op Note  Patient: [name]  Procedure(s) Performed:  CATARACT EXTRACTION PHACO AND INTRAOCULAR LENS PLACEMENT (IOC) - CDE:9.98  Patient Location:  Short Stay  Anesthesia Type: MAC  Level of Consciousness: awake  Airway and Oxygen Therapy: Patient Spontanous Breathing  Post-op Pain: none  Post-op Assessment: Post-op Vital signs reviewed, Patient's Cardiovascular Status Stable, Respiratory Function Stable, Patent Airway, No signs of Nausea or vomiting and Pain level controlled  Post-op Vital Signs: Reviewed and stable  Complications: No apparent anesthesia complications

## 2011-10-22 NOTE — Transfer of Care (Signed)
Immediate Anesthesia Transfer of Care Note  Patient: [name]  Procedure(s) Performed:  CATARACT EXTRACTION PHACO AND INTRAOCULAR LENS PLACEMENT (IOC) - CDE:9.98  Patient Location: Shortstay  Anesthesia Type: MAC  Level of Consciousness: awake  Airway & Oxygen Therapy: Patient Spontanous Breathing   Post-op Assessment: Report given to PACU RN, Post -op Vital signs reviewed and stable and Patient moving all extremities  Post vital signs: Reviewed and stable  Complications: No apparent anesthesia complications

## 2011-10-22 NOTE — Anesthesia Procedure Notes (Signed)
Procedure Name: MAC Date/Time: 10/22/2011 10:40 AM Performed by: Minerva Areola Pre-anesthesia Checklist: Patient identified, Patient being monitored, Emergency Drugs available, Timeout performed and Suction available Patient Re-evaluated:Patient Re-evaluated prior to inductionOxygen Delivery Method: Nasal Cannula

## 2011-10-22 NOTE — Brief Op Note (Signed)
Pre-Op Dx: Cataract OD Post-Op Dx: Cataract OD Surgeon: Zeda Gangwer Anesthesia: Topical with MAC Implant: Lenstec, Model Softec HD Blood Loss: None Specimen: None Complications: None 

## 2011-10-23 NOTE — Op Note (Signed)
[name], [name]               ACCOUNT NO.:  [id number]  MEDICAL RECORD NO.:  [id number]  LOCATION:  [facility]                          FACILITY:  [facility]  PHYSICIAN:  Susanne Greenhouse, MD       DATE OF BIRTH:  1941/02/14  DATE OF PROCEDURE:  10/22/2011 DATE OF DISCHARGE:  10/22/2011                              OPERATIVE REPORT   PREOPERATIVE DIAGNOSIS:  Combined cataract, right eye, diagnosis code 366.19.  POSTOPERATIVE DIAGNOSIS:  Combined cataract, right eye, diagnosis code 366.19.  OPERATION PERFORMED:  Phacoemulsification with posterior chamber intraocular lens implantation, right eye.  SURGEON:  Bonne Dolores. Osceola Depaz, MD  ANESTHESIA:  General endotracheal anesthesia.  OPERATIVE SUMMARY:  In the preoperative area, dilating drops were placed into the right eye.  The patient was then brought into the operating room where she was placed under general anesthesia.  The eye was then prepped and draped.  Beginning with a 75 blade, a paracentesis port was made at the surgeon's 2 o'clock position.  The anterior chamber was then filled with a 1% nonpreserved lidocaine solution with epinephrine.  This was followed by Viscoat to deepen the chamber.  A small fornix-based peritomy was performed superiorly.  Next, a single iris hook was placed through the limbus superiorly.  A 2.4-mm keratome blade was then used to make a clear corneal incision over the iris hook.  A bent cystotome needle and Utrata forceps were used to create a continuous tear capsulotomy.  Hydrodissection was performed using balanced salt solution on a fine cannula.  The lens nucleus was then removed using phacoemulsification in a quadrant cracking technique.  The cortical material was then removed with irrigation and aspiration.  The capsular bag and anterior chamber were refilled with Provisc.  The wound was widened to approximately 3 mm and a posterior chamber intraocular lens was placed into the capsular bag without difficulty  using an [employer] lens injecting system.  A single 10-0 nylon suture was then used to close the incision as well as stromal hydration.  The Provisc was removed from the anterior chamber and capsular bag with irrigation and aspiration.  At this point, the wounds were tested for leak, which were negative.  The anterior chamber remained deep and stable.  The patient tolerated the procedure well.  There were no operative complications, and she awoke from general anesthesia without problem.  No surgical specimens.  Prosthetic device used is a Lenstec posterior chamber lens, model Softec HD, power of 20.75, serial number is [id number].          ______________________________ Susanne Greenhouse, MD     KEH/MEDQ  D:  10/22/2011  T:  10/23/2011  Job:  [id number]

## 2011-10-28 ENCOUNTER — Encounter (HOSPITAL_COMMUNITY): Payer: Self-pay | Admitting: Ophthalmology

## 2011-12-23 ENCOUNTER — Encounter: Payer: Self-pay | Admitting: Critical Care Medicine

## 2011-12-23 ENCOUNTER — Ambulatory Visit (INDEPENDENT_AMBULATORY_CARE_PROVIDER_SITE_OTHER): Payer: Medicare Other | Admitting: Critical Care Medicine

## 2011-12-23 DIAGNOSIS — J45909 Unspecified asthma, uncomplicated: Secondary | ICD-10-CM

## 2011-12-23 DIAGNOSIS — J8410 Pulmonary fibrosis, unspecified: Secondary | ICD-10-CM

## 2011-12-23 MED ORDER — BECLOMETHASONE DIPROPIONATE 40 MCG/ACT IN AERS: 2.0000 | INHALATION_SPRAY | Freq: Every day | RESPIRATORY_TRACT | Status: DC

## 2011-12-23 NOTE — Progress Notes (Signed)
Subjective:    Patient ID: [name], female    DOB: 04-23-1941, 71 y.o.   MRN: [id number]  HPI   71 y.o.  WF with hx of moderate persistent asthma and pulm fibrosis idiopathic   2/27 Occ throat clearing otherwise ok.   Had cataract surgery bilateral 12/12  PUL ASTHMA HISTORY 12/23/2011 06/17/2011  Symptoms 0-2 days/week 0-2 days/week  Nighttime awakenings 0-2/month 0-2/month  Interference with activity No limitations No limitations  SABA use 0-2 days/wk 0-2 days/wk  Exacerbations requiring oral steroids 0-1 / year 0-1 / year    Past Medical History  Diagnosis Date  . Postinflammatory pulmonary fibrosis   . Hypothyroidism   . Asthma   . Gallstone pancreatitis      Family History  Problem Relation Age of Onset  . Heart failure Mother   . Emphysema Mother   . Other Father     blood clot in brain  . Breast cancer Sister   . Breast cancer Sister   . Asthma Sister   . Anesthesia problems Neg Hx   . Hypotension Neg Hx   . Malignant hyperthermia Neg Hx   . Pseudochol deficiency Neg Hx      History   Social History  . Marital Status: Married    Spouse Name: N/A    Number of Children: 3  . Years of Education: N/A   Occupational History  . retired Scientist, product/process development    Social History Main Topics  . Smoking status: Never Smoker   . Smokeless tobacco: Never Used  . Alcohol Use: No  . Drug Use: No  . Sexually Active: Not on file   Other Topics Concern  . Not on file   Social History Narrative  . No narrative on file     Allergies  Allergen Reactions  . Sulfonamide Derivatives      Outpatient Prescriptions Prior to Visit  Medication Sig Dispense Refill  . albuterol (PROAIR HFA) 108 (90 BASE) MCG/ACT inhaler Inhale 2 puffs into the lungs every 6 (six) hours as needed. Shortness of breath      . aspirin EC 81 MG tablet Take 81 mg by mouth daily.        . Calcium Carbonate (CALCIUM 600) 1500 MG TABS Take 1 tablet by mouth 2 (two) times daily.       . fish  oil-omega-3 fatty acids 1000 MG capsule Take 1 g by mouth 2 (two) times daily.       Marland Kitchen levothyroxine (SYNTHROID, LEVOTHROID) 88 MCG tablet Take 88 mcg by mouth daily.        . Multiple Vitamins-Minerals (CENTRUM SILVER PO) Take 1 tablet by mouth daily.        . vitamin B-12 (CYANOCOBALAMIN) 1000 MCG tablet Take 1,000 mcg by mouth daily.        . beclomethasone (QVAR) 40 MCG/ACT inhaler 2 puffs at bedtime.           Review of Systems  Constitutional:   No  weight loss, night sweats,  Fevers, chills, fatigue, lassitude. HEENT:   No headaches,  Difficulty swallowing,  Tooth/dental problems,  Sore throat,                No sneezing, itching, ear ache, nasal congestion, post nasal drip,   CV:  No chest pain,  Orthopnea, PND, swelling in lower extremities, anasarca, dizziness, palpitations  GI  No heartburn, indigestion, abdominal pain, nausea, vomiting, diarrhea, change in bowel habits, loss of appetite  Resp:  Notes  shortness of breath with exertion not at rest.  No excess mucus, no productive cough,  No non-productive cough,  No coughing up of blood.  No change in color of mucus.  No wheezing.  No chest wall deformity  Skin: no rash or lesions.  GU: no dysuria, change in color of urine, no urgency or frequency.  No flank pain.  MS:  No joint pain or swelling.  No decreased range of motion.  No back pain.  Psych:  No change in mood or affect. No depression or anxiety.  No memory loss.     Objective:   Physical Exam  Gen: Pleasant, well-nourished, in no distress,  normal affect  ENT: No lesions,  mouth clear,  oropharynx clear, no postnasal drip  Neck: No JVD, no TMG, no carotid bruits  Lungs: No use of accessory muscles, no dullness to percussion, distant bs  Cardiovascular: RRR, heart sounds normal, no murmur or gallops, no peripheral edema  Abdomen: soft and NT, no HSM,  BS normal  Musculoskeletal: No deformities, no cyanosis or clubbing  Neuro: alert, non focal  Skin:  Warm, no lesions or rashes        Assessment & Plan:   FIBROSIS, POSTINFLAMMATORY PULMONARY Stable IPF Plan monitor  ASTHMA Stable mod persistent asthma Plan Cont ICS daily     Updated Medication List Outpatient Encounter Prescriptions as of 12/23/2011  Medication Sig Dispense Refill  . albuterol (PROAIR HFA) 108 (90 BASE) MCG/ACT inhaler Inhale 2 puffs into the lungs every 6 (six) hours as needed. Shortness of breath      . aspirin EC 81 MG tablet Take 81 mg by mouth daily.        . beclomethasone (QVAR) 40 MCG/ACT inhaler Inhale 2 puffs into the lungs at bedtime.  1 Inhaler  11  . Calcium Carbonate (CALCIUM 600) 1500 MG TABS Take 1 tablet by mouth 2 (two) times daily.       . fish oil-omega-3 fatty acids 1000 MG capsule Take 1 g by mouth 2 (two) times daily.       Marland Kitchen levothyroxine (SYNTHROID, LEVOTHROID) 88 MCG tablet Take 88 mcg by mouth daily.        . Multiple Vitamins-Minerals (CENTRUM SILVER PO) Take 1 tablet by mouth daily.        . vitamin B-12 (CYANOCOBALAMIN) 1000 MCG tablet Take 1,000 mcg by mouth daily.        Marland Kitchen DISCONTD: beclomethasone (QVAR) 40 MCG/ACT inhaler 2 puffs at bedtime.

## 2011-12-23 NOTE — Patient Instructions (Signed)
No change in medications. Return in        6 months        

## 2011-12-24 NOTE — Assessment & Plan Note (Signed)
Stable mod persistent asthma Plan Cont ICS daily

## 2011-12-24 NOTE — Assessment & Plan Note (Signed)
Stable IPF Plan monitor

## 2012-07-01 ENCOUNTER — Encounter: Payer: Self-pay | Admitting: Critical Care Medicine

## 2012-07-01 ENCOUNTER — Ambulatory Visit (INDEPENDENT_AMBULATORY_CARE_PROVIDER_SITE_OTHER): Payer: Medicare Other | Admitting: Critical Care Medicine

## 2012-07-01 VITALS — BP 140/88 | HR 54 | Temp 97.7°F | Ht 60.0 in | Wt 145.4 lb

## 2012-07-01 DIAGNOSIS — J8410 Pulmonary fibrosis, unspecified: Secondary | ICD-10-CM

## 2012-07-01 DIAGNOSIS — J45909 Unspecified asthma, uncomplicated: Secondary | ICD-10-CM

## 2012-07-01 DIAGNOSIS — Z23 Encounter for immunization: Secondary | ICD-10-CM

## 2012-07-01 NOTE — Addendum Note (Signed)
Addended by: Nita Sells on: 07/01/2012 11:13 AM   Modules accepted: Orders

## 2012-07-01 NOTE — Patient Instructions (Signed)
Flu vaccine and pneumovax today No change in medications. Return in  6 months

## 2012-07-01 NOTE — Assessment & Plan Note (Signed)
Idiopathic pulmonary fibrosis stable at this time Plan No systemic steroids indicated Continued observation

## 2012-07-01 NOTE — Assessment & Plan Note (Signed)
Moderate persistent asthma stable with inhaled steroid Plan Maintain Qvar daily Return 6 months Administer flu and Pneumovax on today's visit 07/01/2012

## 2012-07-01 NOTE — Progress Notes (Signed)
Subjective:    Patient ID: [name], female    DOB: 03/03/1941, 71 y.o.   MRN: [id number]  HPI   71 y.o.  WF with hx of moderate persistent asthma and pulm fibrosis idiopathic  07/01/2012 No problems since 2/13.  No real cough, just clears throat in AM.  No mucus.  No chest pain. No real wheezing.   Pt denies any significant sore throat, nasal congestion or excess secretions, fever, chills, sweats, unintended weight loss, pleurtic or exertional chest pain, orthopnea PND, or leg swelling Pt denies any increase in rescue therapy over baseline, denies waking up needing it or having any early am or nocturnal exacerbations of coughing/wheezing/or dyspnea. Pt also denies any obvious fluctuation in symptoms with  weather or environmental change or other alleviating or aggravating factors     PUL ASTHMA HISTORY 07/01/2012 12/23/2011 06/17/2011  Symptoms 0-2 days/week 0-2 days/week 0-2 days/week  Nighttime awakenings 0-2/month 0-2/month 0-2/month  Interference with activity No limitations No limitations No limitations  SABA use 0-2 days/wk 0-2 days/wk 0-2 days/wk  Exacerbations requiring oral steroids 0-1 / year 0-1 / year 0-1 / year    Past Medical History  Diagnosis Date  . Postinflammatory pulmonary fibrosis   . Hypothyroidism   . Asthma   . Gallstone pancreatitis      Family History  Problem Relation Age of Onset  . Heart failure Mother   . Emphysema Mother   . Other Father     blood clot in brain  . Breast cancer Sister   . Breast cancer Sister   . Asthma Sister   . Anesthesia problems Neg Hx   . Hypotension Neg Hx   . Malignant hyperthermia Neg Hx   . Pseudochol deficiency Neg Hx      History   Social History  . Marital Status: Married    Spouse Name: N/A    Number of Children: 3  . Years of Education: N/A   Occupational History  . retired Scientist, product/process development    Social History Main Topics  . Smoking status: Never Smoker   . Smokeless tobacco: Never Used  .  Alcohol Use: No  . Drug Use: No  . Sexually Active: Not on file   Other Topics Concern  . Not on file   Social History Narrative  . No narrative on file     Allergies  Allergen Reactions  . Sulfonamide Derivatives      Outpatient Prescriptions Prior to Visit  Medication Sig Dispense Refill  . albuterol (PROAIR HFA) 108 (90 BASE) MCG/ACT inhaler Inhale 2 puffs into the lungs every 6 (six) hours as needed. Shortness of breath      . aspirin EC 81 MG tablet Take 81 mg by mouth daily.        . beclomethasone (QVAR) 40 MCG/ACT inhaler Inhale 2 puffs into the lungs at bedtime.  1 Inhaler  11  . Calcium Carbonate (CALCIUM 600) 1500 MG TABS Take 1 tablet by mouth 2 (two) times daily.       . fish oil-omega-3 fatty acids 1000 MG capsule Take 1 g by mouth 2 (two) times daily.       Marland Kitchen levothyroxine (SYNTHROID, LEVOTHROID) 88 MCG tablet Take 88 mcg by mouth daily.        . Multiple Vitamins-Minerals (CENTRUM SILVER PO) Take 1 tablet by mouth daily.        . vitamin B-12 (CYANOCOBALAMIN) 1000 MCG tablet Take 1,000 mcg by mouth daily.  Review of Systems  Constitutional:   No  weight loss, night sweats,  Fevers, chills, fatigue, lassitude. HEENT:   No headaches,  Difficulty swallowing,  Tooth/dental problems,  Sore throat,                No sneezing, itching, ear ache, nasal congestion, post nasal drip,   CV:  No chest pain,  Orthopnea, PND, swelling in lower extremities, anasarca, dizziness, palpitations  GI  No heartburn, indigestion, abdominal pain, nausea, vomiting, diarrhea, change in bowel habits, loss of appetite  Resp: Notes  shortness of breath with exertion not at rest.  No excess mucus, no productive cough,  No non-productive cough,  No coughing up of blood.  No change in color of mucus.  No wheezing.  No chest wall deformity  Skin: no rash or lesions.  GU: no dysuria, change in color of urine, no urgency or frequency.  No flank pain.  MS:  No joint pain or  swelling.  No decreased range of motion.  No back pain.  Psych:  No change in mood or affect. No depression or anxiety.  No memory loss.     Objective:   Physical Exam BP 140/88  Pulse 54  Temp 97.7 F (36.5 C) (Oral)  Ht 5' (1.524 m)  Wt 145 lb 6.4 oz (65.953 kg)  BMI 28.40 kg/m2  SpO2 96%  Gen: Pleasant, well-nourished, in no distress,  normal affect  ENT: No lesions,  mouth clear,  oropharynx clear, no postnasal drip  Neck: No JVD, no TMG, no carotid bruits  Lungs: No use of accessory muscles, no dullness to percussion, distant bs  Cardiovascular: RRR, heart sounds normal, no murmur or gallops, no peripheral edema  Abdomen: soft and NT, no HSM,  BS normal  Musculoskeletal: No deformities, no cyanosis or clubbing  Neuro: alert, non focal  Skin: Warm, no lesions or rashes        Assessment & Plan:   FIBROSIS, POSTINFLAMMATORY PULMONARY Idiopathic pulmonary fibrosis stable at this time Plan No systemic steroids indicated Continued observation  ASTHMA Moderate persistent asthma stable with inhaled steroid Plan Maintain Qvar daily Return 6 months Administer flu and Pneumovax on today's visit 07/01/2012     Updated Medication List Outpatient Encounter Prescriptions as of 07/01/2012  Medication Sig Dispense Refill  . albuterol (PROAIR HFA) 108 (90 BASE) MCG/ACT inhaler Inhale 2 puffs into the lungs every 6 (six) hours as needed. Shortness of breath      . aspirin EC 81 MG tablet Take 81 mg by mouth daily.        . beclomethasone (QVAR) 40 MCG/ACT inhaler Inhale 2 puffs into the lungs at bedtime.  1 Inhaler  11  . Calcium Carbonate (CALCIUM 600) 1500 MG TABS Take 1 tablet by mouth 2 (two) times daily.       . fish oil-omega-3 fatty acids 1000 MG capsule Take 1 g by mouth 2 (two) times daily.       Marland Kitchen levothyroxine (SYNTHROID, LEVOTHROID) 88 MCG tablet Take 88 mcg by mouth daily.        . Multiple Vitamins-Minerals (CENTRUM SILVER PO) Take 1 tablet by mouth  daily.        . vitamin B-12 (CYANOCOBALAMIN) 1000 MCG tablet Take 1,000 mcg by mouth daily.

## 2013-01-18 ENCOUNTER — Telehealth: Payer: Self-pay | Admitting: Critical Care Medicine

## 2013-01-18 ENCOUNTER — Other Ambulatory Visit: Payer: Self-pay | Admitting: Critical Care Medicine

## 2013-01-18 NOTE — Telephone Encounter (Signed)
I spoke with pharmacy-states they do have QVAR rx from this morning and is ready for pick up. Pt is aware as well. Nothing more needed.

## 2013-02-21 ENCOUNTER — Ambulatory Visit (INDEPENDENT_AMBULATORY_CARE_PROVIDER_SITE_OTHER): Payer: Medicare Other | Admitting: Critical Care Medicine

## 2013-02-21 ENCOUNTER — Encounter: Payer: Self-pay | Admitting: Critical Care Medicine

## 2013-02-21 VITALS — BP 142/86 | HR 63 | Temp 97.6°F | Ht 60.0 in | Wt 145.8 lb

## 2013-02-21 DIAGNOSIS — J8410 Pulmonary fibrosis, unspecified: Secondary | ICD-10-CM

## 2013-02-21 DIAGNOSIS — J45909 Unspecified asthma, uncomplicated: Secondary | ICD-10-CM

## 2013-02-21 DIAGNOSIS — J4530 Mild persistent asthma, uncomplicated: Secondary | ICD-10-CM

## 2013-02-21 MED ORDER — ALBUTEROL SULFATE HFA 108 (90 BASE) MCG/ACT IN AERS: 2.0000 | INHALATION_SPRAY | Freq: Four times a day (QID) | RESPIRATORY_TRACT | Status: DC | PRN

## 2013-02-21 MED ORDER — BECLOMETHASONE DIPROPIONATE 40 MCG/ACT IN AERS: INHALATION_SPRAY | RESPIRATORY_TRACT | Status: DC

## 2013-02-21 NOTE — Progress Notes (Signed)
  Subjective:    Patient ID: [name], female    DOB: 1941/05/10, 72 y.o.   MRN: [id number]  HPI WF with hx of moderate persistent asthma and pulm fibrosis idiopathic  02/21/2013 DOing well since September with only occ cough and mild pndrip with allergies.   No new issues PUL ASTHMA HISTORY 02/21/2013 07/01/2012 12/23/2011 06/17/2011  Symptoms 0-2 days/week 0-2 days/week 0-2 days/week 0-2 days/week  Nighttime awakenings 0-2/month 0-2/month 0-2/month 0-2/month  Interference with activity No limitations No limitations No limitations No limitations  SABA use 0-2 days/wk 0-2 days/wk 0-2 days/wk 0-2 days/wk  Exacerbations requiring oral steroids - 0-1 / year 0-1 / year 0-1 / year      Review of Systems 11 point review of systems taken in detail and is unremarkable    Objective:   Physical Exam Filed Vitals:   02/21/13 1033  BP: 142/86  Pulse: 63  Temp: 97.6 F (36.4 C)  TempSrc: Oral  Height: 5' (1.524 m)  Weight: 145 lb 12.8 oz (66.134 kg)  SpO2: 98%    Gen: Pleasant, well-nourished, in no distress,  normal affect  ENT: No lesions,  mouth clear,  oropharynx clear, no postnasal drip  Neck: No JVD, no TMG, no carotid bruits  Lungs: No use of accessory muscles, no dullness to percussion, clear without rales or rhonchi  Cardiovascular: RRR, heart sounds normal, no murmur or gallops, no peripheral edema  Abdomen: soft and NT, no HSM,  BS normal  Musculoskeletal: No deformities, no cyanosis or clubbing  Neuro: alert, non focal  Skin: Warm, no lesions or rashes  No results found.        Assessment & Plan:   FIBROSIS, POSTINFLAMMATORY PULMONARY Very mild interstitial pulmonary fibrosis stable at this time No additional prednisone, imaging or pulmonary functions indicated unless patient's symptom complex changes  Mild persistent asthma Mild persistent asthma stable at this time Plan Maintain inhaled medications as prescribed   Updated Medication  List Outpatient Encounter Prescriptions as of 02/21/2013  Medication Sig Dispense Refill  . albuterol (PROAIR HFA) 108 (90 BASE) MCG/ACT inhaler Inhale 2 puffs into the lungs every 6 (six) hours as needed. Shortness of breath  1 Inhaler  6  . aspirin EC 81 MG tablet Take 81 mg by mouth daily.        . beclomethasone (QVAR) 40 MCG/ACT inhaler INHALE TWO PUFFS INTO LUNGS AT BEDTIME AS DIRECTED  1 Inhaler  11  . Calcium Carbonate (CALCIUM 600) 1500 MG TABS Take 1 tablet by mouth 2 (two) times daily.       . fish oil-omega-3 fatty acids 1000 MG capsule Take 1 g by mouth 2 (two) times daily.       Marland Kitchen levothyroxine (SYNTHROID, LEVOTHROID) 88 MCG tablet Take 88 mcg by mouth daily.        . Multiple Vitamins-Minerals (CENTRUM SILVER PO) Take 1 tablet by mouth daily.        . [DISCONTINUED] albuterol (PROAIR HFA) 108 (90 BASE) MCG/ACT inhaler Inhale 2 puffs into the lungs every 6 (six) hours as needed. Shortness of breath      . [DISCONTINUED] QVAR 40 MCG/ACT inhaler INHALE TWO PUFFS INTO LUNGS AT BEDTIME AS DIRECTED  1 Inhaler  3  . vitamin B-12 (CYANOCOBALAMIN) 1000 MCG tablet Take 1,000 mcg by mouth daily.         No facility-administered encounter medications on file as of 02/21/2013.

## 2013-02-21 NOTE — Assessment & Plan Note (Signed)
Very mild interstitial pulmonary fibrosis stable at this time No additional prednisone, imaging or pulmonary functions indicated unless patient's symptom complex changes

## 2013-02-21 NOTE — Assessment & Plan Note (Signed)
Mild persistent asthma stable at this time Plan Maintain inhaled medications as prescribed

## 2013-02-21 NOTE — Patient Instructions (Signed)
Stay on Qvar daily at bedtime Proair as needed Return 6 months

## 2013-03-21 ENCOUNTER — Other Ambulatory Visit (INDEPENDENT_AMBULATORY_CARE_PROVIDER_SITE_OTHER): Payer: BC Managed Care – HMO

## 2013-03-21 DIAGNOSIS — Z79899 Other long term (current) drug therapy: Secondary | ICD-10-CM

## 2013-03-21 DIAGNOSIS — E785 Hyperlipidemia, unspecified: Secondary | ICD-10-CM

## 2013-03-21 DIAGNOSIS — E039 Hypothyroidism, unspecified: Secondary | ICD-10-CM

## 2013-03-21 LAB — HEPATIC FUNCTION PANEL
ALT: 18 U/L (ref 0–35)
AST: 17 U/L (ref 0–37)
Albumin: 4 g/dL (ref 3.5–5.2)
Alkaline Phosphatase: 53 U/L (ref 39–117)
Bilirubin, Direct: 0.1 mg/dL (ref 0.0–0.3)
Indirect Bilirubin: 0.3 mg/dL (ref 0.0–0.9)
Total Bilirubin: 0.4 mg/dL (ref 0.3–1.2)
Total Protein: 6.5 g/dL (ref 6.0–8.3)

## 2013-03-21 LAB — THYROID PANEL WITH TSH
Free Thyroxine Index: 4.4 — ABNORMAL HIGH (ref 1.0–3.9)
T3 Uptake: 36.2 % (ref 22.5–37.0)
T4, Total: 12.2 ug/dL (ref 5.0–12.5)
TSH: 2.205 u[IU]/mL (ref 0.350–4.500)

## 2013-03-23 LAB — NMR LIPOPROFILE WITH LIPIDS
Cholesterol, Total: 195 mg/dL (ref <200)
HDL Particle Number: 31.9 umol/L (ref >=30.5)
HDL Size: 8.7 nm — ABNORMAL LOW (ref >=9.2)
HDL-C: 42 mg/dL (ref >=40)
LDL (calc): 113 mg/dL — ABNORMAL HIGH (ref <100)
LDL Particle Number: 1550 nmol/L — ABNORMAL HIGH (ref <1000)
LDL Size: 20.1 nm — ABNORMAL LOW (ref >20.5)
LP-IR Score: 63 — ABNORMAL HIGH (ref <=45)
Large HDL-P: 5.3 umol/L (ref >=4.8)
Large VLDL-P: 3.8 nmol/L — ABNORMAL HIGH (ref <=2.7)
Small LDL Particle Number: 1071 nmol/L — ABNORMAL HIGH (ref <=527)
Triglycerides: 201 mg/dL — ABNORMAL HIGH (ref <150)
VLDL Size: 44.1 nm (ref <=46.6)

## 2013-03-24 NOTE — Progress Notes (Signed)
Quick Note:  Labs abnormal. Needs ov to discuss treatment of the lipids. ______

## 2013-03-28 ENCOUNTER — Encounter: Payer: Self-pay | Admitting: Family Medicine

## 2013-03-28 ENCOUNTER — Ambulatory Visit (INDEPENDENT_AMBULATORY_CARE_PROVIDER_SITE_OTHER): Payer: BC Managed Care – HMO | Admitting: Family Medicine

## 2013-03-28 VITALS — BP 162/87 | HR 72 | Temp 97.3°F | Ht 60.0 in | Wt 145.8 lb

## 2013-03-28 DIAGNOSIS — J309 Allergic rhinitis, unspecified: Secondary | ICD-10-CM

## 2013-03-28 DIAGNOSIS — E785 Hyperlipidemia, unspecified: Secondary | ICD-10-CM

## 2013-03-28 DIAGNOSIS — R7309 Other abnormal glucose: Secondary | ICD-10-CM

## 2013-03-28 DIAGNOSIS — J4530 Mild persistent asthma, uncomplicated: Secondary | ICD-10-CM

## 2013-03-28 DIAGNOSIS — R5383 Other fatigue: Secondary | ICD-10-CM

## 2013-03-28 DIAGNOSIS — J45909 Unspecified asthma, uncomplicated: Secondary | ICD-10-CM

## 2013-03-28 DIAGNOSIS — E039 Hypothyroidism, unspecified: Secondary | ICD-10-CM

## 2013-03-28 DIAGNOSIS — J8410 Pulmonary fibrosis, unspecified: Secondary | ICD-10-CM

## 2013-03-28 DIAGNOSIS — R5381 Other malaise: Secondary | ICD-10-CM

## 2013-03-28 HISTORY — DX: Hyperlipidemia, unspecified: E78.5

## 2013-03-28 LAB — POCT CBC
Granulocyte percent: 71.8 %G (ref 37–80)
HCT, POC: 39.7 % (ref 37.7–47.9)
Hemoglobin: 13.5 g/dL (ref 12.2–16.2)
Lymph, poc: 1.6 (ref 0.6–3.4)
MCH, POC: 31 pg (ref 27–31.2)
MCHC: 34.1 g/dL (ref 31.8–35.4)
MCV: 91.1 fL (ref 80–97)
MPV: 6.4 fL (ref 0–99.8)
POC Granulocyte: 5.2 (ref 2–6.9)
POC LYMPH PERCENT: 21.7 %L (ref 10–50)
Platelet Count, POC: 263 10*3/uL (ref 142–424)
RBC: 4.4 M/uL (ref 4.04–5.48)
RDW, POC: 13.3 %
WBC: 7.3 10*3/uL (ref 4.6–10.2)

## 2013-03-28 NOTE — Progress Notes (Signed)
Patient ID: [name], female   DOB: 1941/08/11, 72 y.o.   MRN: [id number] SUBJECTIVE: HPI: Came to establish with me today since her provider left. Her sister is Dr Nash Dimmer patient. Patient is here for follow up of hyperlipidemia/asthma/SAR/ hypothyroidism: Doing well. Came to get labs done. Just a little fatigue denies Headache;denies Chest Pain;denies weakness;denies Shortness of Breath and orthopnea;denies Visual changes;denies palpitations;denies cough;denies pedal edema;denies symptoms of TIA or stroke;deniesClaudication symptoms. admits to Compliance with medications; denies Problems with medications.  A little allergy flare up.  Mild persistent asthma.  Here for follow up on hypothyroidism  PMH/PSH: reviewed/updated in Epic  SH/FH: reviewed/updated in Epic  Allergies: reviewed/updated in Epic  Medications: reviewed/updated in Epic  Immunizations: reviewed/updated in Epic  ROS: As above in the HPI. All other systems are stable or negative.  OBJECTIVE: APPEARANCE:  Patient in no acute distress.The patient appeared well nourished and normally developed. Acyanotic. Waist: VITAL SIGNS:BP 162/87  Pulse 72  Temp(Src) 97.3 F (36.3 C) (Oral)  Ht 5' (1.524 m)  Wt 145 lb 12.8 oz (66.134 kg)  BMI 28.47 kg/m2 Recheck BP 135/85 WF SKIN: warm and  Dry without overt rashes, tattoos and scars  HEAD and Neck: without JVD, Head and scalp: normal Eyes:No scleral icterus. Fundi normal, eye movements normal. Ears: Auricle normal, canal normal, Tympanic membranes normal, insufflation normal. Nose: normal Throat: normal Neck & thyroid: normal  CHEST & LUNGS: Chest wall: normal Lungs: Clear  CVS: Reveals the PMI to be normally located. Regular rhythm, First and Second Heart sounds are normal,  absence of murmurs, rubs or gallops. Peripheral vasculature: Radial pulses: normal Dorsal pedis pulses: normal Posterior pulses: normal  ABDOMEN:  Appearance:  normal Benign,, no organomegaly, no masses, no Abdominal Aortic enlargement. No Guarding , no rebound. No Bruits. Bowel sounds: normal  RECTAL: N/A GU: N/A  EXTREMETIES: nonedematous. Both Femoral and Pedal pulses are normal.  MUSCULOSKELETAL:  Spine: normal Joints: intact  NEUROLOGIC: oriented to time,place and person; nonfocal. Strength is normal Sensory is normal Reflexes are normal Cranial Nerves are normal.  ASSESSMENT: Mild persistent asthma, uncomplicated  HYPOTHYROIDISM - Plan: TSH  FIBROSIS, POSTINFLAMMATORY PULMONARY  ALLERGIC RHINITIS  HLD (hyperlipidemia) - Plan: COMPLETE METABOLIC PANEL WITH GFR, NMR Lipoprofile with Lipids  Other malaise and fatigue - Plan: Vitamin D 25 hydroxy, POCT CBC  PLAN: Orders Placed This Encounter  Procedures  . COMPLETE METABOLIC PANEL WITH GFR  . NMR Lipoprofile with Lipids  . TSH  . Vitamin D 25 hydroxy  . POCT CBC   No orders of the defined types were placed in this encounter.   Reviewed chart. Discussed wellness.  Return in about 6 months (around 09/27/2013) for Recheck medical problems.  Kaysi Ourada P. [place] Charon, M.D.

## 2013-03-28 NOTE — Addendum Note (Signed)
Addended by: Orma Render F on: 03/28/2013 03:09 PM   Modules accepted: Orders

## 2013-03-29 ENCOUNTER — Other Ambulatory Visit: Payer: Self-pay | Admitting: Family Medicine

## 2013-03-29 DIAGNOSIS — E785 Hyperlipidemia, unspecified: Secondary | ICD-10-CM

## 2013-03-29 LAB — BASIC METABOLIC PANEL WITH GFR
BUN: 13 mg/dL (ref 6–23)
CO2: 28 mEq/L (ref 19–32)
Calcium: 10 mg/dL (ref 8.4–10.5)
Chloride: 107 mEq/L (ref 96–112)
Creat: 0.67 mg/dL (ref 0.50–1.10)
GFR, Est African American: >89 mL/min
GFR, Est Non African American: 89 mL/min
Glucose, Bld: 125 mg/dL — ABNORMAL HIGH (ref 70–99)
Potassium: 4 mEq/L (ref 3.5–5.3)
Sodium: 142 mEq/L (ref 135–145)

## 2013-03-29 LAB — VITAMIN D 25 HYDROXY (VIT D DEFICIENCY, FRACTURES): Vit D, 25-Hydroxy: 39 ng/mL (ref 30–89)

## 2013-03-29 MED ORDER — PRAVASTATIN SODIUM 20 MG PO TABS: 20.0000 mg | ORAL_TABLET | Freq: Every day | ORAL | Status: DC

## 2013-03-29 NOTE — Progress Notes (Signed)
Quick Note:  Labs abnormal. Sugar is high. Needs a HGBA1C to check for DM ASAP. She will need to start on Pravastatin 20 mg daily to reduce the cholesterol.will order in EPIC. Diet and exercise as we discussed. ______

## 2013-04-04 NOTE — Addendum Note (Signed)
Addended by: Pura Spice on: 04/04/2013 03:54 PM   Modules accepted: Orders

## 2013-04-06 ENCOUNTER — Telehealth: Payer: Self-pay | Admitting: Family Medicine

## 2013-04-06 ENCOUNTER — Other Ambulatory Visit (INDEPENDENT_AMBULATORY_CARE_PROVIDER_SITE_OTHER): Payer: Medicare Other

## 2013-04-06 DIAGNOSIS — R7309 Other abnormal glucose: Secondary | ICD-10-CM

## 2013-04-06 LAB — POCT GLYCOSYLATED HEMOGLOBIN (HGB A1C): Hemoglobin A1C: 5.2

## 2013-04-06 NOTE — Telephone Encounter (Signed)
infomed pt results not reviewed yet by dr Modesto Charon and would call her as soon as he reviews them

## 2013-04-07 NOTE — Progress Notes (Signed)
Quick Note:  Call patient. Labs normal.not diabetic. Avoid sugary drinks. No change in plan. ______

## 2013-05-15 ENCOUNTER — Ambulatory Visit (INDEPENDENT_AMBULATORY_CARE_PROVIDER_SITE_OTHER): Payer: Medicare Other | Admitting: *Deleted

## 2013-05-15 DIAGNOSIS — Z23 Encounter for immunization: Secondary | ICD-10-CM

## 2013-05-15 NOTE — Patient Instructions (Addendum)
Herpes Zoster Virus Vaccine What is this medicine? HERPES ZOSTER VIRUS VACCINE (HUR peez ZOS ter vahy ruhs vak SEEN) is a vaccine. It is used to prevent shingles in adults 71 years old and over. This vaccine is not used to treat shingles or nerve pain from shingles. This medicine may be used for other purposes; ask your health care provider or pharmacist if you have questions. What should I tell my health care provider before I take this medicine? They need to know if you have any of these conditions: -cancer like leukemia or lymphoma -immune system problems or therapy -infection with fever -tuberculosis -an unusual or allergic reaction to vaccines, neomycin, gelatin, other medicines, foods, dyes, or preservatives -pregnant or trying to get pregnant -breast-feeding How should I use this medicine? This vaccine is for injection under the skin. It is given by a health care professional. Talk to your pediatrician regarding the use of this medicine in children. This medicine is not approved for use in children. Overdosage: If you think you have taken too much of this medicine contact a poison control center or emergency room at once. NOTE: This medicine is only for you. Do not share this medicine with others. What if I miss a dose? This does not apply. What may interact with this medicine? Do not take this medicine with any of the following medications: -adalimumab -anakinra -etanercept -infliximab -medicines to treat cancer -medicines that suppress your immune system This medicine may also interact with the following medications: -immunoglobulins -steroid medicines like prednisone or cortisone This list may not describe all possible interactions. Give your health care provider a list of all the medicines, herbs, non-prescription drugs, or dietary supplements you use. Also tell them if you smoke, drink alcohol, or use illegal drugs. Some items may interact with your medicine. What should I  watch for while using this medicine? Visit your doctor for regular check ups. This vaccine, like all vaccines, may not fully protect everyone. After receiving this vaccine it may be possible to pass chickenpox infection to others. Avoid people with immune system problems, pregnant women who have not had chickenpox, and newborns of women who have not had chickenpox. Talk to your doctor for more information. What side effects may I notice from receiving this medicine? Side effects that you should report to your doctor or health care professional as soon as possible: -allergic reactions like skin rash, itching or hives, swelling of the face, lips, or tongue -breathing problems -feeling faint or lightheaded, falls -fever, flu-like symptoms -pain, tingling, numbness in the hands or feet -swelling of the ankles, feet, hands -unusually weak or tired Side effects that usually do not require medical attention (report to your doctor or health care professional if they continue or are bothersome): -aches or pains -chickenpox-like rash -diarrhea -headache -loss of appetite -nausea, vomiting -redness, pain, swelling at site where injected -runny nose This list may not describe all possible side effects. Call your doctor for medical advice about side effects. You may report side effects to FDA at 1-800-FDA-1088. Where should I keep my medicine? This drug is given in a hospital or clinic and will not be stored at home. NOTE: This sheet is a summary. It may not cover all possible information. If you have questions about this medicine, talk to your doctor, pharmacist, or health care provider.  2013, [employer]/Gold Standard. (03/31/2010 5:43:50 PM)

## 2013-05-15 NOTE — Progress Notes (Signed)
Patient tolerated well.

## 2013-07-10 ENCOUNTER — Encounter: Payer: Self-pay | Admitting: *Deleted

## 2013-07-31 ENCOUNTER — Ambulatory Visit (INDEPENDENT_AMBULATORY_CARE_PROVIDER_SITE_OTHER): Payer: Medicare Other

## 2013-07-31 DIAGNOSIS — Z23 Encounter for immunization: Secondary | ICD-10-CM

## 2013-08-22 ENCOUNTER — Ambulatory Visit (INDEPENDENT_AMBULATORY_CARE_PROVIDER_SITE_OTHER): Payer: Medicare Other | Admitting: Critical Care Medicine

## 2013-08-22 ENCOUNTER — Encounter: Payer: Self-pay | Admitting: Critical Care Medicine

## 2013-08-22 VITALS — BP 126/84 | HR 74 | Temp 98.0°F | Ht 60.5 in | Wt 142.4 lb

## 2013-08-22 DIAGNOSIS — J4530 Mild persistent asthma, uncomplicated: Secondary | ICD-10-CM

## 2013-08-22 DIAGNOSIS — J8410 Pulmonary fibrosis, unspecified: Secondary | ICD-10-CM

## 2013-08-22 DIAGNOSIS — J45909 Unspecified asthma, uncomplicated: Secondary | ICD-10-CM

## 2013-08-22 NOTE — Patient Instructions (Signed)
No change in medications. Return in        6 months        

## 2013-08-22 NOTE — Progress Notes (Signed)
  Subjective:    Patient ID: [name], female    DOB: 1941-10-24, 72 y.o.   MRN: [id number]  HPI  72 y.o. WF with hx of moderate persistent asthma and pulm fibrosis idiopathic   08/22/2013 Chief Complaint  Patient presents with  . 6 month follow up    Breathing doing well overall.  Does have DOE.  No wheezing, chest tightness, chest pain, or cough.  Doing well no new issues  PUL ASTHMA HISTORY 02/21/2013 07/01/2012 12/23/2011 06/17/2011  Symptoms 0-2 days/week 0-2 days/week 0-2 days/week 0-2 days/week  Nighttime awakenings 0-2/month 0-2/month 0-2/month 0-2/month  Interference with activity No limitations No limitations No limitations No limitations  SABA use 0-2 days/wk 0-2 days/wk 0-2 days/wk 0-2 days/wk  Exacerbations requiring oral steroids - 0-1 / year 0-1 / year 0-1 / year      Review of Systems  11 point review of systems taken in detail and is unremarkable    Objective:   Physical Exam  Filed Vitals:   08/22/13 1023  BP: 126/84  Pulse: 74  Temp: 98 F (36.7 C)  TempSrc: Oral  Height: 5' 0.5" (1.537 m)  Weight: 142 lb 6.4 oz (64.592 kg)  SpO2: 98%    Gen: Pleasant, well-nourished, in no distress,  normal affect  ENT: No lesions,  mouth clear,  oropharynx clear, no postnasal drip  Neck: No JVD, no TMG, no carotid bruits  Lungs: No use of accessory muscles, no dullness to percussion, clear without rales or rhonchi  Cardiovascular: RRR, heart sounds normal, no murmur or gallops, no peripheral edema  Abdomen: soft and NT, no HSM,  BS normal  Musculoskeletal: No deformities, no cyanosis or clubbing  Neuro: alert, non focal  Skin: Warm, no lesions or rashes  No results found.        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     Updated Medication List Outpatient Encounter  Prescriptions as of 08/22/2013  Medication Sig Dispense Refill  . albuterol (PROAIR HFA) 108 (90 BASE) MCG/ACT inhaler Inhale 2 puffs into the lungs every 6 (six) hours as needed. Shortness of breath  1 Inhaler  6  . aspirin EC 81 MG tablet Take 81 mg by mouth daily.        . beclomethasone (QVAR) 40 MCG/ACT inhaler INHALE TWO PUFFS INTO LUNGS AT BEDTIME AS DIRECTED  1 Inhaler  11  . calcium-vitamin D (OSCAL) 250-125 MG-UNIT per tablet Take 1 tablet by mouth 2 (two) times daily.      . fish oil-omega-3 fatty acids 1000 MG capsule Take 1 g by mouth 2 (two) times daily.       Marland Kitchen levothyroxine (SYNTHROID, LEVOTHROID) 88 MCG tablet Take 88 mcg by mouth daily.        . Multiple Vitamins-Minerals (CENTRUM SILVER PO) Take 1 tablet by mouth daily.        . [DISCONTINUED] Calcium Carbonate (CALCIUM 600) 1500 MG TABS Take 1 tablet by mouth 2 (two) times daily.       . [DISCONTINUED] pravastatin (PRAVACHOL) 20 MG tablet Take 1 tablet (20 mg total) by mouth daily.  30 tablet  6   No facility-administered encounter medications on file as of 08/22/2013.

## 2013-08-24 NOTE — Assessment & Plan Note (Signed)
Mild persistent asthma stable Plan No change in medications.

## 2013-08-24 NOTE — Assessment & Plan Note (Signed)
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

## 2013-09-26 ENCOUNTER — Encounter: Payer: Self-pay | Admitting: Family Medicine

## 2013-09-26 ENCOUNTER — Ambulatory Visit (INDEPENDENT_AMBULATORY_CARE_PROVIDER_SITE_OTHER): Payer: Medicare Other | Admitting: Family Medicine

## 2013-09-26 VITALS — BP 143/81 | HR 74 | Temp 97.1°F | Ht 61.5 in | Wt 141.2 lb

## 2013-09-26 DIAGNOSIS — E039 Hypothyroidism, unspecified: Secondary | ICD-10-CM

## 2013-09-26 DIAGNOSIS — E785 Hyperlipidemia, unspecified: Secondary | ICD-10-CM

## 2013-09-26 DIAGNOSIS — J8410 Pulmonary fibrosis, unspecified: Secondary | ICD-10-CM

## 2013-09-26 DIAGNOSIS — J45909 Unspecified asthma, uncomplicated: Secondary | ICD-10-CM

## 2013-09-26 DIAGNOSIS — J309 Allergic rhinitis, unspecified: Secondary | ICD-10-CM

## 2013-09-26 DIAGNOSIS — J4530 Mild persistent asthma, uncomplicated: Secondary | ICD-10-CM

## 2013-09-26 MED ORDER — LEVOTHYROXINE SODIUM 88 MCG PO TABS: 88.0000 ug | ORAL_TABLET | Freq: Every day | ORAL | Status: DC

## 2013-09-26 NOTE — Progress Notes (Signed)
Patient ID: [name], female   DOB: January 13, 1941, 72 y.o.   MRN: [id number] SUBJECTIVE: CC: Chief Complaint  Patient presents with  . Follow-up    6 month follow up wants 90 day supply levothyroxine     HPI: Doing fine. No complaints , no symptoms. Came for her 6 months follow up and lab work. Trying to eat healthier and keep active.  Past Medical History  Diagnosis Date  . Postinflammatory pulmonary fibrosis   . Hypothyroidism   . Asthma   . Gallstone pancreatitis    Past Surgical History  Procedure Laterality Date  . Cholecystectomy  2010  . Tubal ligation    . Cataract extraction w/phaco  10/01/2011    Procedure: CATARACT EXTRACTION PHACO AND INTRAOCULAR LENS PLACEMENT (IOC);  Surgeon: Gemma Payor;  Location: [facility];  Service: Ophthalmology;  Laterality: Left;  CDE:13.74  . Cataract extraction w/phaco  10/22/2011    Procedure: CATARACT EXTRACTION PHACO AND INTRAOCULAR LENS PLACEMENT (IOC);  Surgeon: Gemma Payor;  Location: [facility];  Service: Ophthalmology;  Laterality: Right;  CDE:9.98   History   Social History  . Marital Status: Married    Spouse Name: N/A    Number of Children: 3  . Years of Education: N/A   Occupational History  . retired Scientist, product/process development    Social History Main Topics  . Smoking status: Never Smoker   . Smokeless tobacco: Never Used  . Alcohol Use: No  . Drug Use: No  . Sexual Activity: Not on file   Other Topics Concern  . Not on file   Social History Narrative  . No narrative on file   Family History  Problem Relation Age of Onset  . Heart failure Mother   . Emphysema Mother   . Other Father     blood clot in brain  . Breast cancer Sister   . Breast cancer Sister   . Asthma Sister   . Anesthesia problems Neg Hx   . Hypotension Neg Hx   . Malignant hyperthermia Neg Hx   . Pseudochol deficiency Neg Hx    Current Outpatient Prescriptions on File Prior to Visit  Medication Sig Dispense Refill  . albuterol (PROAIR HFA) 108 (90  BASE) MCG/ACT inhaler Inhale 2 puffs into the lungs every 6 (six) hours as needed. Shortness of breath  1 Inhaler  6  . aspirin EC 81 MG tablet Take 81 mg by mouth daily.        . beclomethasone (QVAR) 40 MCG/ACT inhaler INHALE TWO PUFFS INTO LUNGS AT BEDTIME AS DIRECTED  1 Inhaler  11  . calcium-vitamin D (OSCAL) 250-125 MG-UNIT per tablet Take 1 tablet by mouth 2 (two) times daily.      . fish oil-omega-3 fatty acids 1000 MG capsule Take 1 g by mouth 2 (two) times daily.       . Multiple Vitamins-Minerals (CENTRUM SILVER PO) Take 1 tablet by mouth daily.         No current facility-administered medications on file prior to visit.   Allergies  Allergen Reactions  . Sulfonamide Derivatives    Immunization History  Administered Date(s) Administered  . Influenza Split 07/01/2012  . Influenza Whole 07/30/2006, 07/11/2008, 07/31/2009, 06/26/2010, 06/27/2011  . Influenza,inj,Quad PF,36+ Mos 07/31/2013  . Pneumococcal Polysaccharide-23 06/27/2005, 07/01/2012  . Zoster 05/15/2013   Prior to Admission medications   Medication Sig Start Date End Date Taking? Authorizing Provider  albuterol (PROAIR HFA) 108 (90 BASE) MCG/ACT inhaler Inhale 2 puffs into the  lungs every 6 (six) hours as needed. Shortness of breath 02/21/13   Storm Frisk, MD  aspirin EC 81 MG tablet Take 81 mg by mouth daily.      Historical Provider, MD  beclomethasone (QVAR) 40 MCG/ACT inhaler INHALE TWO PUFFS INTO LUNGS AT BEDTIME AS DIRECTED 02/21/13   Storm Frisk, MD  calcium-vitamin D (OSCAL) 250-125 MG-UNIT per tablet Take 1 tablet by mouth 2 (two) times daily.    Historical Provider, MD  fish oil-omega-3 fatty acids 1000 MG capsule Take 1 g by mouth 2 (two) times daily.     Historical Provider, MD  levothyroxine (SYNTHROID, LEVOTHROID) 88 MCG tablet Take 88 mcg by mouth daily.      Historical Provider, MD  Multiple Vitamins-Minerals (CENTRUM SILVER PO) Take 1 tablet by mouth daily.      Historical Provider, MD      ROS: As above in the HPI. All other systems are stable or negative.  OBJECTIVE: APPEARANCE:  Patient in no acute distress.The patient appeared well nourished and normally developed. Acyanotic. Waist: VITAL SIGNS:BP 143/81  Pulse 74  Temp(Src) 97.1 F (36.2 C) (Oral)  Ht 5' 1.5" (1.562 m)  Wt 141 lb 3.2 oz (64.048 kg)  BMI 26.25 kg/m2 WF  SKIN: warm and  Dry without overt rashes, tattoos and scars  HEAD and Neck: without JVD, Head and scalp: normal Eyes:No scleral icterus. Fundi normal, eye movements normal. Ears: Auricle normal, canal normal, Tympanic membranes normal, insufflation normal. Nose: normal Throat: normal Neck & thyroid: normal  CHEST & LUNGS: Chest wall: normal Lungs: Clear  CVS: Reveals the PMI to be normally located. Regular rhythm, First and Second Heart sounds are normal,  absence of murmurs, rubs or gallops. Peripheral vasculature: Radial pulses: normal Dorsal pedis pulses: normal Posterior pulses: normal  ABDOMEN:  Appearance: normal Benign, no organomegaly, no masses, no Abdominal Aortic enlargement. No Guarding , no rebound. No Bruits. Bowel sounds: normal  RECTAL: N/A GU: N/A  EXTREMETIES: nonedematous.  MUSCULOSKELETAL:  Spine: normal Joints: intact  NEUROLOGIC: oriented to time,place and person; nonfocal. Strength is normal Sensory is normal Reflexes are normal Cranial Nerves are normal.  ASSESSMENT:  FIBROSIS, POSTINFLAMMATORY PULMONARY  HYPOTHYROIDISM - Plan: levothyroxine (SYNTHROID, LEVOTHROID) 88 MCG tablet, TSH  HLD (hyperlipidemia) - Plan: CMP14+EGFR, Lipid panel  Mild persistent asthma  ALLERGIC RHINITIS  PLAN:       Dr Woodroe Mode Recommendations  For nutrition information, I recommend books:  1).Eat to Live by Dr Monico Hoar. 2).Prevent and Reverse Heart Disease by Dr Suzzette Righter. 3) Dr Katherina Right Book:  Program to Reverse Diabetes  Exercise recommendations are:  If unable to  walk, then the patient can exercise in a chair 3 times a day. By flapping arms like a bird gently and raising legs outwards to the front.  If ambulatory, the patient can go for walks for 30 minutes 3 times a week. Then increase the intensity and duration as tolerated.  Goal is to try to attain exercise frequency to 5 times a week.  If applicable: Best to perform resistance exercises (machines or weights) 2 days a week and cardio type exercises 3 days per week.  Orders Placed This Encounter  Procedures  . CMP14+EGFR  . Lipid panel  . TSH   Meds ordered this encounter  Medications  . levothyroxine (SYNTHROID, LEVOTHROID) 88 MCG tablet    Sig: Take 1 tablet (88 mcg total) by mouth daily.    Dispense:  90 tablet  Refill:  3   Medications Discontinued During This Encounter  Medication Reason  . levothyroxine (SYNTHROID, LEVOTHROID) 88 MCG tablet Reorder   Return in about 6 months (around 03/27/2014) for Recheck medical problems.  Ranvir Renovato P. [place] Charon, M.D.

## 2013-09-26 NOTE — Patient Instructions (Signed)
      Dr Katelin Kutsch's Recommendations  For nutrition information, I recommend books:  1).Eat to Live by Dr Joel Fuhrman. 2).Prevent and Reverse Heart Disease by Dr Caldwell Esselstyn. 3) Dr Neal Barnard's Book:  Program to Reverse Diabetes  Exercise recommendations are:  If unable to walk, then the patient can exercise in a chair 3 times a day. By flapping arms like a bird gently and raising legs outwards to the front.  If ambulatory, the patient can go for walks for 30 minutes 3 times a week. Then increase the intensity and duration as tolerated.  Goal is to try to attain exercise frequency to 5 times a week.  If applicable: Best to perform resistance exercises (machines or weights) 2 days a week and cardio type exercises 3 days per week.  

## 2013-09-27 ENCOUNTER — Ambulatory Visit: Payer: BC Managed Care – HMO | Admitting: Family Medicine

## 2013-09-27 LAB — LIPID PANEL
Chol/HDL Ratio: 3.9 ratio units (ref 0.0–4.4)
Cholesterol, Total: 212 mg/dL — ABNORMAL HIGH (ref 100–199)
HDL: 55 mg/dL (ref >39)
LDL Calculated: 134 mg/dL — ABNORMAL HIGH (ref 0–99)
Triglycerides: 113 mg/dL (ref 0–149)
VLDL Cholesterol Cal: 23 mg/dL (ref 5–40)

## 2013-09-27 LAB — CMP14+EGFR
ALT: 17 IU/L (ref 0–32)
AST: 18 IU/L (ref 0–40)
Albumin/Globulin Ratio: 2 (ref 1.1–2.5)
Albumin: 4.4 g/dL (ref 3.5–4.8)
Alkaline Phosphatase: 61 IU/L (ref 39–117)
BUN/Creatinine Ratio: 18 (ref 11–26)
BUN: 14 mg/dL (ref 8–27)
CO2: 24 mmol/L (ref 18–29)
Calcium: 9.7 mg/dL (ref 8.6–10.2)
Chloride: 104 mmol/L (ref 97–108)
Creatinine, Ser: 0.78 mg/dL (ref 0.57–1.00)
GFR calc Af Amer: 88 mL/min/{1.73_m2} (ref >59)
GFR calc non Af Amer: 77 mL/min/{1.73_m2} (ref >59)
Globulin, Total: 2.2 g/dL (ref 1.5–4.5)
Glucose: 98 mg/dL (ref 65–99)
Potassium: 4.3 mmol/L (ref 3.5–5.2)
Sodium: 144 mmol/L (ref 134–144)
Total Bilirubin: 0.4 mg/dL (ref 0.0–1.2)
Total Protein: 6.6 g/dL (ref 6.0–8.5)

## 2013-09-27 LAB — TSH: TSH: 1.47 u[IU]/mL (ref 0.450–4.500)

## 2013-09-29 ENCOUNTER — Other Ambulatory Visit: Payer: Self-pay | Admitting: Family Medicine

## 2013-09-29 DIAGNOSIS — E785 Hyperlipidemia, unspecified: Secondary | ICD-10-CM

## 2013-09-29 NOTE — Progress Notes (Signed)
Quick Note:  Call Patient Labs abnormal:lipids are not at goal.  Recommendations: Needs to be on a plant based diet. Will need to recheck lipids fasting in 3 to 4 months. Labs ordered in EPIC  The rest of the labs were fine.      ______

## 2013-10-16 ENCOUNTER — Telehealth: Payer: Self-pay | Admitting: *Deleted

## 2013-10-16 NOTE — Telephone Encounter (Signed)
Message copied by Baltazar Apo on Mon Oct 16, 2013  1:58 PM ------      Message from: Ileana Ladd      Created: Fri Sep 29, 2013  8:44 PM       Call Patient      Labs abnormal:lipids are not at goal.            Recommendations:      Needs to be on a plant based diet.      Will need to recheck lipids fasting in 3 to 4 months.      Labs ordered in EPIC            The rest of the labs were fine.                               ------

## 2013-10-16 NOTE — Telephone Encounter (Signed)
Pt notified and verbalized understanding.

## 2014-01-19 ENCOUNTER — Encounter (INDEPENDENT_AMBULATORY_CARE_PROVIDER_SITE_OTHER): Payer: Self-pay

## 2014-01-19 ENCOUNTER — Ambulatory Visit (INDEPENDENT_AMBULATORY_CARE_PROVIDER_SITE_OTHER): Payer: Medicare Other | Admitting: Family Medicine

## 2014-01-19 VITALS — BP 137/76 | HR 61 | Temp 96.6°F | Ht 61.5 in | Wt 143.0 lb

## 2014-01-19 DIAGNOSIS — H8110 Benign paroxysmal vertigo, unspecified ear: Secondary | ICD-10-CM

## 2014-01-19 MED ORDER — MECLIZINE HCL 25 MG PO TABS: 25.0000 mg | ORAL_TABLET | Freq: Three times a day (TID) | ORAL | Status: DC | PRN

## 2014-01-19 NOTE — Progress Notes (Signed)
   Subjective:    Patient ID: [name], female    DOB: [birth year], 73 y.o.   MRN: [id number]  HPI This 73 y.o. female presents for evaluation of dizziness for a day. She vomited this am but denies Any nausea at present.   Review of Systems C/o vertigo No chest pain, SOB, HA, dizziness, vision change, N/V, diarrhea, constipation, dysuria, urinary urgency or frequency, myalgias, arthralgias or rash.     Objective:   Physical Exam  Vital signs noted  Well developed well nourished female.  HEENT - Head atraumatic Normocephalic                Eyes - PERRLA, Conjuctiva - clear Sclera- Clear EOMI                Ears - EAC's Wnl TM's Wnl Gross Hearing WNL                Throat - oropharanx wnl Respiratory - Lungs CTA bilateral Cardiac - RRR S1 and S2 without murmur Neuro - Grossly intact.     Assessment & Plan:  Benign paroxysmal positional vertigo - Plan: meclizine (ANTIVERT) 25 MG tablet, DISCONTINUED: meclizine (ANTIVERT) 25 MG tablet Deatra CanterWilliam J Kathy Wahid FNP

## 2014-02-21 ENCOUNTER — Ambulatory Visit (INDEPENDENT_AMBULATORY_CARE_PROVIDER_SITE_OTHER): Payer: Medicare Other | Admitting: Critical Care Medicine

## 2014-02-21 ENCOUNTER — Encounter: Payer: Self-pay | Admitting: Critical Care Medicine

## 2014-02-21 VITALS — BP 142/76 | HR 66 | Temp 97.1°F | Ht 60.5 in | Wt 144.2 lb

## 2014-02-21 DIAGNOSIS — J45909 Unspecified asthma, uncomplicated: Secondary | ICD-10-CM

## 2014-02-21 DIAGNOSIS — J8410 Pulmonary fibrosis, unspecified: Secondary | ICD-10-CM

## 2014-02-21 DIAGNOSIS — J4530 Mild persistent asthma, uncomplicated: Secondary | ICD-10-CM

## 2014-02-21 MED ORDER — BECLOMETHASONE DIPROPIONATE 40 MCG/ACT IN AERS: INHALATION_SPRAY | RESPIRATORY_TRACT | Status: DC

## 2014-02-21 NOTE — Progress Notes (Signed)
  Subjective:    Patient ID: Margit HanksMaxine P Roache, female    DOB: [birth year], 73 y.o.   MRN: [id number]  HPI  73 y.o. WF with hx of moderate persistent asthma and pulm fibrosis idiopathic  02/21/2014 Chief Complaint  Patient presents with  . 6 month follow up    Breathing doing well overall.  No SOB, wheezing, chest tightness/pain, or cough noted at this time.  Now doing well with dyspnea. No cough or mucus No ABX over the winter. Stable on Qvar daily 2 puff No Saba use   PUL ASTHMA HISTORY 02/21/2014 02/21/2013 07/01/2012 12/23/2011 06/17/2011  Symptoms 0-2 days/week 0-2 days/week 0-2 days/week 0-2 days/week 0-2 days/week  Nighttime awakenings 0-2/month 0-2/month 0-2/month 0-2/month 0-2/month  Interference with activity No limitations No limitations No limitations No limitations No limitations  SABA use 0-2 days/wk 0-2 days/wk 0-2 days/wk 0-2 days/wk 0-2 days/wk  Exacerbations requiring oral steroids 0-1 / year - 0-1 / year 0-1 / year 0-1 / year      Review of Systems  11 point review of systems taken in detail and is unremarkable    Objective:   Physical Exam  Filed Vitals:   02/21/14 1103  BP: 142/76  Pulse: 66  Temp: 97.1 F (36.2 C)  TempSrc: Oral  Height: 5' 0.5" (1.537 m)  Weight: 144 lb 3.2 oz (65.409 kg)  SpO2: 98%    Gen: Pleasant, well-nourished, in no distress,  normal affect  ENT: No lesions,  mouth clear,  oropharynx clear, no postnasal drip  Neck: No JVD, no TMG, no carotid bruits  Lungs: No use of accessory muscles, no dullness to percussion, clear without rales or rhonchi  Cardiovascular: RRR, heart sounds normal, no murmur or gallops, no peripheral edema  Abdomen: soft and NT, no HSM,  BS normal  Musculoskeletal: No deformities, no cyanosis or clubbing  Neuro: alert, non focal  Skin: Warm, no lesions or rashes  No results found.        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 albuterol (PROAIR HFA) 108 (90 BASE) MCG/ACT inhaler Inhale 2 puffs into the lungs every 6 (six) hours as needed. Shortness of breath  . aspirin EC 81 MG tablet Take 81 mg by mouth daily.    . beclomethasone (QVAR) 40 MCG/ACT inhaler INHALE TWO PUFFS INTO LUNGS AT BEDTIME AS DIRECTED  . calcium-vitamin D (OSCAL) 250-125 MG-UNIT per tablet Take 1 tablet by mouth 2 (two) times daily.  . Cholecalciferol (VITAMIN D-3) 1000 UNITS CAPS Take by mouth daily.   . fish oil-omega-3 fatty acids 1000 MG capsule Take 1 g by mouth 2 (two) times daily.   Marland Kitchen. levothyroxine (SYNTHROID, LEVOTHROID) 88 MCG tablet Take 1 tablet (88 mcg total) by mouth daily.  . meclizine (ANTIVERT) 25 MG tablet Take 1 tablet (25 mg total) by mouth 3 (three) times daily as needed for dizziness.  . Multiple Vitamins-Minerals (CENTRUM SILVER PO) Take 1 tablet by mouth daily.    . [DISCONTINUED] beclomethasone (QVAR) 40 MCG/ACT inhaler INHALE TWO PUFFS INTO LUNGS AT BEDTIME AS DIRECTED

## 2014-02-21 NOTE — Patient Instructions (Signed)
No change in medications. Return in        6 months        

## 2014-02-22 NOTE — Assessment & Plan Note (Signed)
Stable mild persistent asthma Plan Maintain inhaled steroids

## 2014-02-22 NOTE — Assessment & Plan Note (Signed)
Idiopathic pulmonary fibrosis Stable this time Plan Monitor

## 2014-05-16 ENCOUNTER — Encounter: Payer: Self-pay | Admitting: Family

## 2014-05-16 ENCOUNTER — Ambulatory Visit (INDEPENDENT_AMBULATORY_CARE_PROVIDER_SITE_OTHER): Payer: Medicare Other | Admitting: Family

## 2014-05-16 VITALS — BP 127/71 | HR 79 | Temp 98.6°F | Ht 60.05 in | Wt 142.0 lb

## 2014-05-16 DIAGNOSIS — E785 Hyperlipidemia, unspecified: Secondary | ICD-10-CM

## 2014-05-16 DIAGNOSIS — R5383 Other fatigue: Secondary | ICD-10-CM

## 2014-05-16 DIAGNOSIS — E039 Hypothyroidism, unspecified: Secondary | ICD-10-CM

## 2014-05-16 DIAGNOSIS — J45909 Unspecified asthma, uncomplicated: Secondary | ICD-10-CM

## 2014-05-16 DIAGNOSIS — R5381 Other malaise: Secondary | ICD-10-CM

## 2014-05-16 DIAGNOSIS — J4530 Mild persistent asthma, uncomplicated: Secondary | ICD-10-CM

## 2014-05-16 MED ORDER — LEVOTHYROXINE SODIUM 88 MCG PO TABS: 88.0000 ug | ORAL_TABLET | Freq: Every day | ORAL | Status: DC

## 2014-05-16 NOTE — Patient Instructions (Signed)

## 2014-05-16 NOTE — Progress Notes (Signed)
   Subjective:    Patient ID: [name], female    DOB: [birth year], 73 y.o.   MRN: [id number]  Thyroid Problem Presents for follow-up visit. Patient reports no anxiety, depressed mood, diaphoresis, diarrhea, dry skin, fatigue, hair loss or palpitations. The symptoms have been stable. Past treatments include levothyroxine. The treatment provided significant relief. Her past medical history is significant for hyperlipidemia. There is no history of diabetes.  Hyperlipidemia This is a chronic problem. The current episode started more than 1 year ago. The problem is uncontrolled. Recent lipid tests were reviewed and are high. Exacerbating diseases include hypothyroidism. She has no history of diabetes. Factors aggravating her hyperlipidemia include fatty foods. Pertinent negatives include no leg pain, myalgias or shortness of breath. Current antihyperlipidemic treatment includes diet change. The current treatment provides mild improvement of lipids. Risk factors for coronary artery disease include dyslipidemia, hypertension, post-menopausal and family history.      Review of Systems  Constitutional: Negative.  Negative for diaphoresis and fatigue.  HENT: Negative.   Eyes: Negative.   Respiratory: Negative.  Negative for shortness of breath.   Cardiovascular: Negative.  Negative for palpitations.  Gastrointestinal: Negative.  Negative for diarrhea.  Endocrine: Negative.   Genitourinary: Negative.   Musculoskeletal: Negative.  Negative for myalgias.  Neurological: Negative.  Negative for headaches.  Hematological: Negative.   Psychiatric/Behavioral: Negative.   All other systems reviewed and are negative.      Objective:   Physical Exam  Vitals reviewed. Constitutional: She is oriented to person, place, and time. She appears well-developed and well-nourished. No distress.  HENT:  Head: Normocephalic and atraumatic.  Right Ear: External ear normal.  Mouth/Throat: Oropharynx is clear  and moist.  Eyes: Pupils are equal, round, and reactive to light.  Neck: Normal range of motion. Neck supple. No thyromegaly present.  Cardiovascular: Normal rate, regular rhythm, normal heart sounds and intact distal pulses.   No murmur heard. Pulmonary/Chest: Effort normal and breath sounds normal. No respiratory distress. She has no wheezes.  Abdominal: Soft. Bowel sounds are normal. She exhibits no distension. There is no tenderness.  Musculoskeletal: Normal range of motion. She exhibits no edema and no tenderness.  Neurological: She is alert and oriented to person, place, and time. She has normal reflexes. No cranial nerve deficit.  Skin: Skin is warm and dry.  Psychiatric: She has a normal mood and affect. Her behavior is normal. Judgment and thought content normal.      BP 127/71  Pulse 79  Temp(Src) 98.6 F (37 C) (Oral)  Ht 5' 0.05" (1.525 m)  Wt 142 lb (64.411 kg)  BMI 27.70 kg/m2     Assessment & Plan:  1. HLD (hyperlipidemia) - Lipid panel - CMP14+EGFR - Thyroid Panel With TSH  2. Unspecified hypothyroidism - Thyroid Panel With TSH - levothyroxine (SYNTHROID, LEVOTHROID) 88 MCG tablet; Take 1 tablet (88 mcg total) by mouth daily.  Dispense: 90 tablet; Refill: 3  3. Other malaise and fatigue - Thyroid Panel With TSH  4. Mild persistent asthma, uncomplicated  5. HYPOTHYROIDISM - Thyroid Panel With TSH - levothyroxine (SYNTHROID, LEVOTHROID) 88 MCG tablet; Take 1 tablet (88 mcg total) by mouth daily.  Dispense: 90 tablet; Refill: 3   Continue all meds Labs pending Health Maintenance reviewed Diet and exercise encouraged RTO 6 months  Evelina Dun, FNP

## 2014-05-17 LAB — THYROID PANEL WITH TSH
Free Thyroxine Index: 3.4 (ref 1.2–4.9)
T3 Uptake Ratio: 29 % (ref 24–39)
T4 TOTAL: 11.6 ug/dL (ref 4.5–12.0)
TSH: 1.62 u[IU]/mL (ref 0.450–4.500)

## 2014-05-17 LAB — LIPID PANEL
CHOL/HDL RATIO: 4.2 ratio (ref 0.0–4.4)
CHOLESTEROL TOTAL: 203 mg/dL — AB (ref 100–199)
HDL: 48 mg/dL (ref >39)
LDL CALC: 134 mg/dL — AB (ref 0–99)
Triglycerides: 103 mg/dL (ref 0–149)
VLDL Cholesterol Cal: 21 mg/dL (ref 5–40)

## 2014-05-17 LAB — CMP14+EGFR
ALBUMIN: 4.5 g/dL (ref 3.5–4.8)
ALK PHOS: 60 IU/L (ref 39–117)
ALT: 22 IU/L (ref 0–32)
AST: 19 IU/L (ref 0–40)
Albumin/Globulin Ratio: 2 (ref 1.1–2.5)
BUN / CREAT RATIO: 15 (ref 11–26)
BUN: 13 mg/dL (ref 8–27)
CALCIUM: 9.9 mg/dL (ref 8.7–10.3)
CHLORIDE: 105 mmol/L (ref 97–108)
CO2: 23 mmol/L (ref 18–29)
Creatinine, Ser: 0.85 mg/dL (ref 0.57–1.00)
GFR calc Af Amer: 79 mL/min/{1.73_m2} (ref >59)
GFR calc non Af Amer: 69 mL/min/{1.73_m2} (ref >59)
Globulin, Total: 2.3 g/dL (ref 1.5–4.5)
Glucose: 91 mg/dL (ref 65–99)
Potassium: 4.2 mmol/L (ref 3.5–5.2)
Sodium: 142 mmol/L (ref 134–144)
Total Bilirubin: 0.5 mg/dL (ref 0.0–1.2)
Total Protein: 6.8 g/dL (ref 6.0–8.5)

## 2014-05-21 ENCOUNTER — Other Ambulatory Visit: Payer: Self-pay | Admitting: Family

## 2014-05-21 MED ORDER — PRAVASTATIN SODIUM 40 MG PO TABS: 40.0000 mg | ORAL_TABLET | Freq: Every day | ORAL | Status: DC

## 2014-07-24 ENCOUNTER — Ambulatory Visit (INDEPENDENT_AMBULATORY_CARE_PROVIDER_SITE_OTHER): Payer: Medicare Other

## 2014-07-24 DIAGNOSIS — Z23 Encounter for immunization: Secondary | ICD-10-CM

## 2014-09-05 ENCOUNTER — Ambulatory Visit (INDEPENDENT_AMBULATORY_CARE_PROVIDER_SITE_OTHER): Payer: Medicare Other | Admitting: Critical Care Medicine

## 2014-09-05 ENCOUNTER — Encounter: Payer: Self-pay | Admitting: Critical Care Medicine

## 2014-09-05 VITALS — BP 118/82 | HR 68 | Temp 97.8°F | Ht 60.0 in | Wt 143.6 lb

## 2014-09-05 DIAGNOSIS — J4530 Mild persistent asthma, uncomplicated: Secondary | ICD-10-CM

## 2014-09-05 DIAGNOSIS — J8410 Pulmonary fibrosis, unspecified: Secondary | ICD-10-CM

## 2014-09-05 DIAGNOSIS — Z23 Encounter for immunization: Secondary | ICD-10-CM

## 2014-09-05 MED ORDER — BECLOMETHASONE DIPROPIONATE 40 MCG/ACT IN AERS: INHALATION_SPRAY | RESPIRATORY_TRACT | Status: DC

## 2014-09-05 MED ORDER — ALBUTEROL SULFATE HFA 108 (90 BASE) MCG/ACT IN AERS: 2.0000 | INHALATION_SPRAY | Freq: Four times a day (QID) | RESPIRATORY_TRACT | Status: DC | PRN

## 2014-09-05 NOTE — Assessment & Plan Note (Signed)
Mild intermittent asthma  Plan Cont inhaled meds, refills given prevnar 13 given

## 2014-09-05 NOTE — Assessment & Plan Note (Signed)
Stable ipf  no systemic rx needed

## 2014-09-05 NOTE — Progress Notes (Signed)
Subjective:    Patient ID: [name], female    DOB: 1941/03/15, 73 y.o.   MRN: [id number]  HPI 73 y.o. 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 Follow-up    none  No issues since summer.  No real cough. No use of SABA.  No chest pain. Pt denies any significant sore throat, nasal congestion or excess secretions, fever, chills, sweats, unintended weight loss, pleurtic or exertional chest pain, orthopnea PND, or leg swelling Pt denies any increase in rescue therapy over baseline, denies waking up needing it or having any early am or nocturnal exacerbations of coughing/wheezing/or dyspnea. Pt also denies any obvious fluctuation in symptoms with  weather or environmental change or other alleviating or aggravating factors    PUL ASTHMA HISTORY 09/05/2014 02/21/2014 02/21/2013 07/01/2012 12/23/2011 06/17/2011  Symptoms 0-2 days/week 0-2 days/week 0-2 days/week 0-2 days/week 0-2 days/week 0-2 days/week  Nighttime awakenings 0-2/month 0-2/month 0-2/month 0-2/month 0-2/month 0-2/month  Interference with activity No limitations No limitations No limitations No limitations No limitations No limitations  SABA use 0-2 days/wk 0-2 days/wk 0-2 days/wk 0-2 days/wk 0-2 days/wk 0-2 days/wk  Exacerbations requiring oral steroids 0-1 / year 0-1 / year - 0-1 / year 0-1 / year 0-1 / year      Review of Systems 11 point review of systems taken in detail and is unremarkable    Objective:   Physical Exam Filed Vitals:   09/05/14 1142 09/05/14 1144  BP: 118/82 118/82  Pulse:  68  Temp:  97.8 F (36.6 C)  Height:  5' (1.524 m)  Weight:  143 lb 9.6 oz (65.137 kg)  SpO2: 97% 97%    Gen: Pleasant, well-nourished, in no distress,  normal affect  ENT: No lesions,  mouth clear,  oropharynx clear, no postnasal drip  Neck: No JVD, no TMG, no carotid bruits  Lungs: No use of accessory muscles, no dullness to  percussion, clear without rales or rhonchi  Cardiovascular: RRR, heart sounds normal, no murmur or gallops, no peripheral edema  Abdomen: soft and NT, no HSM,  BS normal  Musculoskeletal: No deformities, no cyanosis or clubbing  Neuro: alert, non focal  Skin: Warm, no lesions or rashes  No results found.        Assessment & Plan:   FIBROSIS, POSTINFLAMMATORY PULMONARY Stable ipf  no systemic rx needed  Mild persistent asthma Mild intermittent asthma  Plan Cont inhaled meds, refills given prevnar 13 given    Updated Medication List Outpatient Encounter Prescriptions as of 09/05/2014  Medication Sig  . albuterol (PROAIR HFA) 108 (90 BASE) MCG/ACT inhaler Inhale 2 puffs into the lungs every 6 (six) hours as needed. Shortness of breath  . aspirin EC 81 MG tablet Take 81 mg by mouth daily.    . beclomethasone (QVAR) 40 MCG/ACT inhaler INHALE TWO PUFFS INTO LUNGS AT BEDTIME AS DIRECTED  . Cholecalciferol (VITAMIN D-3) 1000 UNITS CAPS Take by mouth daily.   . fish oil-omega-3 fatty acids 1000 MG capsule Take 1 g by mouth 2 (two) times daily.   Marland Kitchen. levothyroxine (SYNTHROID, LEVOTHROID) 88 MCG tablet Take 1 tablet (88 mcg total) by mouth daily.  . meclizine (ANTIVERT) 25 MG tablet Take 1 tablet (25 mg total) by mouth 3 (three) times daily as needed for dizziness.  . Multiple Vitamins-Minerals (CENTRUM SILVER PO) Take 1 tablet by mouth daily.    . [DISCONTINUED] albuterol (PROAIR HFA) 108 (90  BASE) MCG/ACT inhaler Inhale 2 puffs into the lungs every 6 (six) hours as needed. Shortness of breath  . [DISCONTINUED] beclomethasone (QVAR) 40 MCG/ACT inhaler INHALE TWO PUFFS INTO LUNGS AT BEDTIME AS DIRECTED  . calcium-vitamin D (OSCAL) 250-125 MG-UNIT per tablet Take 1 tablet by mouth 2 (two) times daily.  . nitrofurantoin, macrocrystal-monohydrate, (MACROBID) 100 MG capsule Take 1 capsule by mouth 2 (two) times daily.  . pravastatin (PRAVACHOL) 40 MG tablet Take 1 tablet (40 mg total)  by mouth daily.

## 2014-09-05 NOTE — Patient Instructions (Signed)
Prevnar 13 vaccine was given No change in medications Return 6 months Refills sent on albuterol and qvar

## 2014-11-23 ENCOUNTER — Encounter: Payer: Self-pay | Admitting: Family

## 2014-11-23 ENCOUNTER — Ambulatory Visit (INDEPENDENT_AMBULATORY_CARE_PROVIDER_SITE_OTHER): Payer: Medicare Other | Admitting: Family

## 2014-11-23 VITALS — BP 143/87 | HR 78 | Temp 97.1°F | Ht 60.0 in | Wt 142.8 lb

## 2014-11-23 DIAGNOSIS — E785 Hyperlipidemia, unspecified: Secondary | ICD-10-CM

## 2014-11-23 DIAGNOSIS — J309 Allergic rhinitis, unspecified: Secondary | ICD-10-CM

## 2014-11-23 DIAGNOSIS — J4530 Mild persistent asthma, uncomplicated: Secondary | ICD-10-CM

## 2014-11-23 DIAGNOSIS — E039 Hypothyroidism, unspecified: Secondary | ICD-10-CM

## 2014-11-23 DIAGNOSIS — Z1321 Encounter for screening for nutritional disorder: Secondary | ICD-10-CM | POA: Diagnosis not present

## 2014-11-23 MED ORDER — LEVOTHYROXINE SODIUM 88 MCG PO TABS: 88.0000 ug | ORAL_TABLET | Freq: Every day | ORAL | Status: DC

## 2014-11-23 NOTE — Progress Notes (Signed)
Subjective:    Patient ID: [name], female    DOB: 02-07-1941, 74 y.o.   MRN: [id number]  Thyroid Problem Presents for follow-up visit. Patient reports no anxiety, diaphoresis, diarrhea, dry skin, fatigue, palpitations or weight gain. The symptoms have been stable. Past treatments include levothyroxine. The treatment provided significant relief. Her past medical history is significant for hyperlipidemia. There is no history of diabetes.  Hyperlipidemia This is a chronic problem. The current episode started more than 1 year ago. The problem is uncontrolled. Recent lipid tests were reviewed and are high. Exacerbating diseases include hypothyroidism. She has no history of diabetes. Pertinent negatives include no leg pain, myalgias or shortness of breath. Current antihyperlipidemic treatment includes diet change and herbal therapy. The current treatment provides mild improvement of lipids. Risk factors for coronary artery disease include dyslipidemia, hypertension, family history and post-menopausal.  Asthma There is no cough, difficulty breathing, hemoptysis, shortness of breath or wheezing. This is a chronic problem. The current episode started more than 1 year ago. The problem occurs rarely. The problem has been resolved. Pertinent negatives include no dyspnea on exertion, ear congestion, headaches, myalgias, rhinorrhea, sneezing or trouble swallowing. Her symptoms are aggravated by change in weather. Her symptoms are alleviated by rest and steroid inhaler. She reports complete improvement on treatment. Her past medical history is significant for asthma.      Review of Systems  Constitutional: Negative.  Negative for weight gain, diaphoresis and fatigue.  HENT: Negative.  Negative for rhinorrhea, sneezing and trouble swallowing.   Eyes: Negative.   Respiratory: Negative.  Negative for cough, hemoptysis, shortness of breath and wheezing.   Cardiovascular: Negative.  Negative for dyspnea on  exertion and palpitations.  Gastrointestinal: Negative.  Negative for diarrhea.  Endocrine: Negative.   Genitourinary: Negative.   Musculoskeletal: Negative.  Negative for myalgias.  Neurological: Negative.  Negative for headaches.  Hematological: Negative.   Psychiatric/Behavioral: Negative.  The patient is not nervous/anxious.   All other systems reviewed and are negative.      Objective:   Physical Exam  Constitutional: She is oriented to person, place, and time. She appears well-developed and well-nourished. No distress.  HENT:  Head: Normocephalic and atraumatic.  Right Ear: External ear normal.  Left Ear: External ear normal.  Nose: Nose normal.  Mouth/Throat: Oropharynx is clear and moist.  Eyes: Pupils are equal, round, and reactive to light.  Neck: Normal range of motion. Neck supple. No thyromegaly present.  Cardiovascular: Normal rate, regular rhythm, normal heart sounds and intact distal pulses.   No murmur heard. Pulmonary/Chest: Effort normal and breath sounds normal. No respiratory distress. She has no wheezes.  Abdominal: Soft. Bowel sounds are normal. She exhibits no distension. There is no tenderness.  Musculoskeletal: Normal range of motion. She exhibits no edema or tenderness.  Neurological: She is alert and oriented to person, place, and time. She has normal reflexes. No cranial nerve deficit.  Skin: Skin is warm and dry.  Psychiatric: She has a normal mood and affect. Her behavior is normal. Judgment and thought content normal.  Vitals reviewed.   BP 143/87 mmHg  Pulse 78  Temp(Src) 97.1 F (36.2 C) (Oral)  Ht 5' (1.524 m)  Wt 142 lb 12.8 oz (64.774 kg)  BMI 27.89 kg/m2       Assessment & Plan:  1. HLD (hyperlipidemia) -Pt does not want to be on medications-Pt trying to use diet and exercise to reduce LDL  CMP14+EGFR - Lipid panel  2. Hypothyroidism,  unspecified hypothyroidism type - CMP14+EGFR - levothyroxine (SYNTHROID, LEVOTHROID) 88 MCG  tablet; Take 1 tablet (88 mcg total) by mouth daily.  Dispense: 90 tablet; Refill: 3 - Thyroid Panel With TSH  3. Mild persistent asthma, uncomplicated - VEZ50+ZTAE  4. Allergic rhinitis, unspecified allergic rhinitis type - CMP14+EGFR  5. Encounter for vitamin deficiency screening - Vit D  25 hydroxy (rtn osteoporosis monitoring)   Continue all meds Labs pending Health Maintenance reviewed Diet and exercise encouraged RTO 6 months  Evelina Dun, FNP

## 2014-11-23 NOTE — Patient Instructions (Signed)

## 2014-11-24 ENCOUNTER — Other Ambulatory Visit: Payer: Self-pay | Admitting: Family

## 2014-11-24 LAB — CMP14+EGFR
ALBUMIN: 4.4 g/dL (ref 3.5–4.8)
ALT: 21 IU/L (ref 0–32)
AST: 20 IU/L (ref 0–40)
Albumin/Globulin Ratio: 1.8 (ref 1.1–2.5)
Alkaline Phosphatase: 57 IU/L (ref 39–117)
BUN/Creatinine Ratio: 17 (ref 11–26)
BUN: 14 mg/dL (ref 8–27)
CALCIUM: 9.8 mg/dL (ref 8.7–10.3)
CO2: 25 mmol/L (ref 18–29)
Chloride: 104 mmol/L (ref 97–108)
Creatinine, Ser: 0.83 mg/dL (ref 0.57–1.00)
GFR calc Af Amer: 81 mL/min/{1.73_m2} (ref >59)
GFR calc non Af Amer: 71 mL/min/{1.73_m2} (ref >59)
Globulin, Total: 2.4 g/dL (ref 1.5–4.5)
Glucose: 94 mg/dL (ref 65–99)
Potassium: 4 mmol/L (ref 3.5–5.2)
Sodium: 143 mmol/L (ref 134–144)
TOTAL PROTEIN: 6.8 g/dL (ref 6.0–8.5)
Total Bilirubin: 0.5 mg/dL (ref 0.0–1.2)

## 2014-11-24 LAB — LIPID PANEL
Chol/HDL Ratio: 3.7 ratio units (ref 0.0–4.4)
Cholesterol, Total: 207 mg/dL — ABNORMAL HIGH (ref 100–199)
HDL: 56 mg/dL (ref >39)
LDL CALC: 134 mg/dL — AB (ref 0–99)
TRIGLYCERIDES: 86 mg/dL (ref 0–149)
VLDL Cholesterol Cal: 17 mg/dL (ref 5–40)

## 2014-11-24 LAB — THYROID PANEL WITH TSH
Free Thyroxine Index: 3.3 (ref 1.2–4.9)
T3 Uptake Ratio: 29 % (ref 24–39)
T4, Total: 11.5 ug/dL (ref 4.5–12.0)
TSH: 3.96 u[IU]/mL (ref 0.450–4.500)

## 2014-11-24 LAB — VITAMIN D 25 HYDROXY (VIT D DEFICIENCY, FRACTURES): Vit D, 25-Hydroxy: 62.2 ng/mL (ref 30.0–100.0)

## 2014-11-26 ENCOUNTER — Encounter: Payer: Self-pay | Admitting: Family

## 2014-12-11 DIAGNOSIS — Z1231 Encounter for screening mammogram for malignant neoplasm of breast: Secondary | ICD-10-CM | POA: Diagnosis not present

## 2014-12-18 DIAGNOSIS — R922 Inconclusive mammogram: Secondary | ICD-10-CM | POA: Diagnosis not present

## 2014-12-18 DIAGNOSIS — R928 Other abnormal and inconclusive findings on diagnostic imaging of breast: Secondary | ICD-10-CM | POA: Diagnosis not present

## 2015-01-03 ENCOUNTER — Other Ambulatory Visit: Payer: Self-pay | Admitting: *Deleted

## 2015-01-03 ENCOUNTER — Telehealth: Payer: Self-pay | Admitting: Family

## 2015-01-03 MED ORDER — MECLIZINE HCL 25 MG PO TABS: 25.0000 mg | ORAL_TABLET | Freq: Three times a day (TID) | ORAL | Status: DC | PRN

## 2015-01-14 ENCOUNTER — Telehealth: Payer: Self-pay | Admitting: Critical Care Medicine

## 2015-01-14 NOTE — Telephone Encounter (Signed)
Called and LMTCB x1 w/ spouse

## 2015-01-14 NOTE — Telephone Encounter (Signed)
Pt has returned call. She is on the road. Please leave message with husband - [phone]. If unable to do that call her cell and she will pull over on the road - [phone]

## 2015-01-14 NOTE — Telephone Encounter (Signed)
ATC pt cell and was not able to leave message Spouse reports pt still not home. Need to confirm message above. WCB

## 2015-01-15 ENCOUNTER — Telehealth: Payer: Self-pay | Admitting: Critical Care Medicine

## 2015-01-15 NOTE — Telephone Encounter (Signed)
Lorel MonacoLindsay C Lemons, CMA at 01/15/2015 9:03 AM     Status: Signed       Expand All Collapse All   Spoke with pt. States that insurance has raised the cost on Qvar. Flovent will be cheaper and would like to switch.  PW - please advise. Thanks.       Advised pt that we are still waiting on PW's response. She verbalized understanding.  PW - please advise. Thanks.

## 2015-01-15 NOTE — Telephone Encounter (Signed)
[phone], pt calling back does not have many puff left, wanting to speak to nurse.

## 2015-01-15 NOTE — Telephone Encounter (Signed)
Spoke with pt. States that insurance has raised the cost on Qvar. Flovent will be cheaper and would like to switch.  PW - please advise.  Thanks.

## 2015-01-16 MED ORDER — FLUTICASONE PROPIONATE HFA 110 MCG/ACT IN AERO: 2.0000 | INHALATION_SPRAY | Freq: Two times a day (BID) | RESPIRATORY_TRACT | Status: DC

## 2015-01-16 NOTE — Telephone Encounter (Signed)
Go to Flovent HFA 110  Two puff bid

## 2015-01-16 NOTE — Telephone Encounter (Signed)
Rx has been sent in. Pt is aware. Nothing further is needed. 

## 2015-01-25 ENCOUNTER — Telehealth: Payer: Self-pay | Admitting: Critical Care Medicine

## 2015-01-25 MED ORDER — ALBUTEROL SULFATE HFA 108 (90 BASE) MCG/ACT IN AERS: 2.0000 | INHALATION_SPRAY | Freq: Four times a day (QID) | RESPIRATORY_TRACT | Status: DC | PRN

## 2015-01-25 NOTE — Telephone Encounter (Signed)
Spoke with pt, states she started the flovent last Sunday and now is having blisters form in her mouth X3-4 days.  Pt is gargling after each use of the flovent.  Is requesting an alternative inhaler.   Dr. Delford FieldWright please advise on an alternative to flovent.  Thanks!  Allergies  Allergen Reactions  . Sulfonamide Derivatives

## 2015-01-25 NOTE — Telephone Encounter (Signed)
She will need an OV with TP to sort out  Stop flovent for now

## 2015-01-25 NOTE — Telephone Encounter (Signed)
Spoke with pt, she is aware of recs.  Scheduled for Monday at 11;15 with TP.  Nothing further needed.

## 2015-01-28 ENCOUNTER — Encounter: Payer: Self-pay | Admitting: Adult Health

## 2015-01-28 ENCOUNTER — Telehealth: Payer: Self-pay | Admitting: Critical Care Medicine

## 2015-01-28 ENCOUNTER — Ambulatory Visit (INDEPENDENT_AMBULATORY_CARE_PROVIDER_SITE_OTHER): Payer: Medicare Other | Admitting: Adult Health

## 2015-01-28 VITALS — BP 132/84 | HR 68 | Temp 97.7°F | Ht 60.0 in | Wt 146.8 lb

## 2015-01-28 DIAGNOSIS — J4530 Mild persistent asthma, uncomplicated: Secondary | ICD-10-CM

## 2015-01-28 MED ORDER — BECLOMETHASONE DIPROPIONATE 40 MCG/ACT IN AERS: 2.0000 | INHALATION_SPRAY | Freq: Every day | RESPIRATORY_TRACT | Status: DC

## 2015-01-28 MED ORDER — BECLOMETHASONE DIPROPIONATE 80 MCG/ACT IN AERS: 2.0000 | INHALATION_SPRAY | Freq: Two times a day (BID) | RESPIRATORY_TRACT | Status: DC

## 2015-01-28 MED ORDER — CLOTRIMAZOLE 10 MG MT TROC: 10.0000 mg | Freq: Every day | OROMUCOSAL | Status: DC

## 2015-01-28 NOTE — Patient Instructions (Signed)
May restart QVAR 2 puffs At bedtime  , rinse after use .  Stop Flovent .  Mycelex troches five times daily for 1 week.  Follow up Dr. Delford FieldWright  As planned and As needed   Please contact office for sooner follow up if symptoms do not improve or worsen or seek emergency care

## 2015-01-28 NOTE — Telephone Encounter (Signed)
Pt call back about inhaler.Caren GriffinsStanley A Dalton

## 2015-01-28 NOTE — Progress Notes (Signed)
   Subjective:    Patient ID: [name], female    DOB: [birth year], 74 y.o.   MRN: [id number]  HPI 74 y.o. 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  Reports symptoms began approx 3 days after starting; does report she is rinsing her mouth thoroughly after each use.  stopped the Flovent as of 4/1 .  Was changed from QVAR to Flovent per request due to insurance formulary changes  Wants to go back to QVAR . Will pay higher copay-feels better on QVAR .  No wheezing or cough .     Review of Systems Constitutional:   No  weight loss, night sweats,  Fevers, chills,  fatigue, or  lassitude.  HEENT:   No headaches,  Difficulty swallowing,  Tooth/dental problems, or   +Sore throat,                No sneezing, itching, ear ache, nasal congestion, post nasal drip,   CV:  No chest pain,  Orthopnea, PND, swelling in lower extremities, anasarca, dizziness, palpitations, syncope.   GI  No heartburn, indigestion, abdominal pain, nausea, vomiting, diarrhea, change in bowel habits, loss of appetite, bloody stools.   Resp: No shortness of breath with exertion or at rest.  No excess mucus, no productive cough,  No non-productive cough,  No coughing up of blood.  No change in color of mucus.  No wheezing.  No chest wall deformity  Skin: no rash or lesions.  GU: no dysuria, change in color of urine, no urgency or frequency.  No flank pain, no hematuria   MS:  No joint pain or swelling.  No decreased range of motion.  No back pain.  Psych:  No change in mood or affect. No depression or anxiety.  No memory loss.         Objective:   Physical Exam GEN: A/Ox3; pleasant , NAD, elderly   HEENT:  [place]/AT,  EACs-clear, TMs-wnl, NOSE-clear, THROAT-clear, mild excoriation along right buccal mucosa.    NECK:  Supple w/ fair ROM; no JVD; normal carotid impulses w/o bruits; no  thyromegaly or nodules palpated; no lymphadenopathy.  RESP  Clear  P & A; w/o, wheezes/ rales/ or rhonchi.no accessory muscle use, no dullness to percussion  CARD:  RRR, no m/r/g  , no peripheral edema, pulses intact, no cyanosis or clubbing.  GI:   Soft & nt; nml bowel sounds; no organomegaly or masses detected.  Musco: Warm bil, no deformities or joint swelling noted.   Neuro: alert, no focal deficits noted.    Skin: Warm, no lesions or rashes         Assessment & Plan:

## 2015-01-28 NOTE — Telephone Encounter (Signed)
Pt returned call. Informed pt of the status. Qvar sample left up front for pt to pick up. (Qvar 80-ok per TP for pt to use 1 puff once a day as we do not have any Qvar 40 samples).Pt verbalized understanding.   Will await Qvar 40 PA.

## 2015-01-28 NOTE — Assessment & Plan Note (Signed)
Intolerant to eBayFlovent   Plan  May restart QVAR 2 puffs At bedtime  , rinse after use .  Stop Flovent .  Mycelex troches five times daily for 1 week.  Follow up Dr. Delford FieldWright  As planned and As needed   Please contact office for sooner follow up if symptoms do not improve or worsen or seek emergency care

## 2015-01-28 NOTE — Addendum Note (Signed)
Addended by: Boone MasterJONES, JESSICA E on: 01/28/2015 12:22 PM   Modules accepted: Orders, Medications

## 2015-01-28 NOTE — Telephone Encounter (Signed)
Spoke with pt, advised her again that a qvar sample is up front ready for pickup. Also advised pt that we are working on the PA but it can take several days before insurance approves or denies it.   Will hold in triage d/t the PA.

## 2015-01-28 NOTE — Telephone Encounter (Signed)
Called and spoke to pt. Pt stated she thinks the QVAR is needing a PA. [facility] and Qvar does need PA. PA form already faxed to our office. LMTCB for pt to inform her.   Will hold in triage till form is received.

## 2015-01-30 NOTE — Telephone Encounter (Signed)
Ok for flovent, 110 two puff bid hfa

## 2015-01-30 NOTE — Telephone Encounter (Signed)
Pt is aware of medication change. States that she is going to take the sample of Qvar we gave her then switch. She will call when she needs Flovent.

## 2015-01-30 NOTE — Telephone Encounter (Signed)
Qvar is not going to be covered by [employer] insurance. Alternative is Flovent.  PW - please advise. Thanks.

## 2015-02-04 ENCOUNTER — Telehealth: Payer: Self-pay | Admitting: *Deleted

## 2015-02-04 NOTE — Telephone Encounter (Signed)
[facility] mcg [facility] and spoke to [place]. Medication approved  # [id number] Called the pharmacy with approval and medication went through.

## 2015-02-11 ENCOUNTER — Telehealth: Payer: Self-pay | Admitting: *Deleted

## 2015-02-11 NOTE — Telephone Encounter (Signed)
Called Ms. [place] and informed that PA was approved and she can contact [facility] when she is ready to fill. She says she has a sample right now and would like to complete it first. Nothing further needed.

## 2015-02-19 DIAGNOSIS — Z01419 Encounter for gynecological examination (general) (routine) without abnormal findings: Secondary | ICD-10-CM | POA: Diagnosis not present

## 2015-02-19 DIAGNOSIS — Z124 Encounter for screening for malignant neoplasm of cervix: Secondary | ICD-10-CM | POA: Diagnosis not present

## 2015-02-19 DIAGNOSIS — N909 Noninflammatory disorder of vulva and perineum, unspecified: Secondary | ICD-10-CM | POA: Diagnosis not present

## 2015-03-11 ENCOUNTER — Encounter: Payer: Self-pay | Admitting: Critical Care Medicine

## 2015-03-11 ENCOUNTER — Ambulatory Visit (INDEPENDENT_AMBULATORY_CARE_PROVIDER_SITE_OTHER): Payer: Medicare Other | Admitting: Critical Care Medicine

## 2015-03-11 VITALS — BP 144/78 | HR 62 | Temp 97.8°F | Ht 60.0 in | Wt 144.0 lb

## 2015-03-11 DIAGNOSIS — J4530 Mild persistent asthma, uncomplicated: Secondary | ICD-10-CM | POA: Diagnosis not present

## 2015-03-11 MED ORDER — MOMETASONE FUROATE 100 MCG/ACT IN AERO: 2.0000 | INHALATION_SPRAY | Freq: Every day | RESPIRATORY_TRACT | Status: DC

## 2015-03-11 NOTE — Assessment & Plan Note (Signed)
Mild persistent asthma stable on inhaled steroid Plan Maintain inhaled steroid either Qvar Asmanex 2 puffs daily Return 6 months

## 2015-03-11 NOTE — Patient Instructions (Signed)
You can use asmanex two puff daily in place of Qvar two puff daily (use samples) No other changes Return 4 months

## 2015-03-11 NOTE — Progress Notes (Signed)
Subjective:    Patient ID: [name], female    DOB: Dec 08, 1940, 74 y.o.   MRN: [id number]  HPI 03/11/2015 Chief Complaint  Patient presents with  . Follow-up    Pt states qvar has helped control symptoms. She states she has no new complaints.    Qvar has helped.  copay is expensive.  Tried flovent but had too many blisters.  Now no real cough. No dyspnea.    Pt denies any significant sore throat, nasal congestion or excess secretions, fever, chills, sweats, unintended weight loss, pleurtic or exertional chest pain, orthopnea PND, or leg swelling Pt denies any increase in rescue therapy over baseline, denies waking up needing it or having any early am or nocturnal exacerbations of coughing/wheezing/or dyspnea. Pt also denies any obvious fluctuation in symptoms with  weather or environmental change or other alleviating or aggravating factors   Current Medications, Allergies, Complete Past Medical History, Past Surgical History, Family History, and Social History were reviewed in [employer] record.  Past Medical History  Diagnosis Date  . Postinflammatory pulmonary fibrosis   . Hypothyroidism   . Asthma   . Gallstone pancreatitis   . Vertigo      Family History  Problem Relation Age of Onset  . Heart failure Mother   . Emphysema Mother   . Other Father     blood clot in brain  . Breast cancer Sister   . Breast cancer Sister   . Asthma Sister   . Anesthesia problems Neg Hx   . Hypotension Neg Hx   . Malignant hyperthermia Neg Hx   . Pseudochol deficiency Neg Hx      History   Social History  . Marital Status: Married    Spouse Name: N/A  . Number of Children: 3  . Years of Education: N/A   Occupational History  . retired Scientist, product/process development    Social History Main Topics  . Smoking status: Never Smoker   . Smokeless tobacco: Never Used  . Alcohol Use: No  . Drug Use: No  . Sexual Activity: Not on file   Other Topics Concern  . Not  on file   Social History Narrative     Allergies  Allergen Reactions  . Sulfonamide Derivatives      Outpatient Prescriptions Prior to Visit  Medication Sig Dispense Refill  . albuterol (PROAIR HFA) 108 (90 BASE) MCG/ACT inhaler Inhale 2 puffs into the lungs every 6 (six) hours as needed. Shortness of breath 1 Inhaler 6  . aspirin EC 81 MG tablet Take 81 mg by mouth daily.      . beclomethasone (QVAR) 40 MCG/ACT inhaler Inhale 2 puffs into the lungs at bedtime. 1 Inhaler 5  . Cholecalciferol (VITAMIN D-3) 1000 UNITS CAPS Take by mouth daily.     . fish oil-omega-3 fatty acids 1000 MG capsule Take 1 g by mouth 2 (two) times daily.     Marland Kitchen levothyroxine (SYNTHROID, LEVOTHROID) 88 MCG tablet Take 1 tablet (88 mcg total) by mouth daily. 90 tablet 3  . meclizine (ANTIVERT) 25 MG tablet Take 1 tablet (25 mg total) by mouth 3 (three) times daily as needed for dizziness. 90 tablet 3  . Multiple Vitamins-Minerals (CENTRUM SILVER PO) Take 1 tablet by mouth daily.      . clotrimazole (MYCELEX) 10 MG troche Take 1 tablet (10 mg total) by mouth 5 (five) times daily. For 1 week (Patient not taking: Reported on 03/11/2015) 35 tablet 0  No facility-administered medications prior to visit.         Review of Systems  Constitutional: Negative for fever, chills, diaphoresis, activity change, appetite change, fatigue and unexpected weight change.  HENT: Negative for congestion, dental problem, ear discharge, ear pain, facial swelling, hearing loss, mouth sores, nosebleeds, postnasal drip, rhinorrhea, sinus pressure, sneezing, sore throat, tinnitus, trouble swallowing and voice change.   Eyes: Negative for photophobia, discharge, itching and visual disturbance.  Respiratory: Positive for shortness of breath. Negative for apnea, cough, choking, chest tightness, wheezing and stridor.   Cardiovascular: Negative for chest pain, palpitations and leg swelling.  Gastrointestinal: Negative for nausea, vomiting,  abdominal pain, constipation, blood in stool and abdominal distention.  Genitourinary: Negative for dysuria, urgency, frequency, hematuria, flank pain, decreased urine volume and difficulty urinating.  Musculoskeletal: Negative for myalgias, back pain, joint swelling, arthralgias, gait problem, neck pain and neck stiffness.  Skin: Negative for color change, pallor and rash.  Neurological: Negative for dizziness, tremors, seizures, syncope, speech difficulty, weakness, light-headedness, numbness and headaches.  Hematological: Negative for adenopathy. Does not bruise/bleed easily.  Psychiatric/Behavioral: Negative for confusion, sleep disturbance and agitation. The patient is not nervous/anxious.        Objective:   Physical Exam  Constitutional: She appears well-developed and well-nourished. She is active.  Non-toxic appearance. She does not appear ill. No distress.  HENT:  Head: Normocephalic and atraumatic.  Nose: No mucosal edema, rhinorrhea, sinus tenderness, nasal deformity or septal deviation. No epistaxis. Right sinus exhibits no maxillary sinus tenderness and no frontal sinus tenderness. Left sinus exhibits no maxillary sinus tenderness and no frontal sinus tenderness.  Mouth/Throat: Oropharynx is clear and moist. No oropharyngeal exudate.  Eyes: Conjunctivae and EOM are normal. Pupils are equal, round, and reactive to light. Right eye exhibits no discharge. Left eye exhibits no discharge. No scleral icterus.  Neck: Normal range of motion. Neck supple. Normal carotid pulses, no hepatojugular reflux and no JVD present. No tracheal tenderness and no muscular tenderness present. Carotid bruit is not present. No rigidity. No tracheal deviation, no edema, no erythema and normal range of motion present. No thyroid mass and no thyromegaly present.  Cardiovascular: Normal rate, regular rhythm, S1 normal, S2 normal, normal heart sounds, intact distal pulses and normal pulses.  PMI is not displaced.   Exam reveals no gallop, no S3, no S4, no distant heart sounds and no friction rub.   No murmur heard.  No systolic murmur is present   No diastolic murmur is present  Pulmonary/Chest: No accessory muscle usage or stridor. No apnea and no tachypnea. No respiratory distress. She has decreased breath sounds in the right lower field, the left middle field and the left lower field. She has no wheezes. She has no rhonchi. She has no rales. Chest wall is not dull to percussion. She exhibits no mass, no tenderness, no bony tenderness and no deformity.  Abdominal: Soft. Normal appearance and bowel sounds are normal. She exhibits no distension, no ascites and no mass. There is no hepatosplenomegaly. There is no tenderness. There is no rigidity, no rebound, no guarding and no CVA tenderness.  Musculoskeletal: Normal range of motion.  Lymphadenopathy:       Head (right side): No submental and no submandibular adenopathy present.       Head (left side): No submental and no submandibular adenopathy present.    She has no cervical adenopathy.    She has no axillary adenopathy.  Neurological: She is alert. She has normal strength and  normal reflexes. No cranial nerve deficit or sensory deficit.  Skin: Skin is warm and dry. No bruising, no ecchymosis, no lesion and no rash noted. She is not diaphoretic. No cyanosis or erythema. No pallor. Nails show no clubbing.  Psychiatric: She has a normal mood and affect. Her speech is normal and behavior is normal.  Vitals reviewed.         Assessment & Plan:  I personally reviewed all images and lab data in the [facility] system as well as any outside material available during this office visit and agree with the  radiology impressions.  I also have reviewed any data /notes/records if available in care everywhere.  Mild persistent asthma Mild persistent asthma stable on inhaled steroid Plan Maintain inhaled steroid either Qvar Asmanex 2 puffs daily Return 6  months    Diannie was seen today for follow-up.  Diagnoses and all orders for this visit:  Mild persistent asthma, uncomplicated  Other orders -     Discontinue: Mometasone Furoate (ASMANEX HFA) 100 MCG/ACT AERO; Inhale 2 puffs into the lungs daily. -     Mometasone Furoate (ASMANEX HFA) 100 MCG/ACT AERO; Inhale 2 puffs into the lungs daily. Use in place of Qvar, use samples    I h

## 2015-03-11 NOTE — Progress Notes (Signed)
   Subjective:    Patient ID: [name], female    DOB: 05/29/1941, 74 y.o.   MRN: [id number]  HPI    Review of Systems     Objective:   Physical Exam        Assessment & Plan:

## 2015-05-10 DIAGNOSIS — H43812 Vitreous degeneration, left eye: Secondary | ICD-10-CM | POA: Diagnosis not present

## 2015-05-10 DIAGNOSIS — H43811 Vitreous degeneration, right eye: Secondary | ICD-10-CM | POA: Diagnosis not present

## 2015-05-10 DIAGNOSIS — Z961 Presence of intraocular lens: Secondary | ICD-10-CM | POA: Diagnosis not present

## 2015-05-10 DIAGNOSIS — H59093 Other disorders of the eye following cataract surgery, bilateral: Secondary | ICD-10-CM | POA: Diagnosis not present

## 2015-05-13 DIAGNOSIS — Z08 Encounter for follow-up examination after completed treatment for malignant neoplasm: Secondary | ICD-10-CM | POA: Diagnosis not present

## 2015-05-13 DIAGNOSIS — Z85820 Personal history of malignant melanoma of skin: Secondary | ICD-10-CM | POA: Diagnosis not present

## 2015-05-13 DIAGNOSIS — B070 Plantar wart: Secondary | ICD-10-CM | POA: Diagnosis not present

## 2015-05-13 DIAGNOSIS — B355 Tinea imbricata: Secondary | ICD-10-CM | POA: Diagnosis not present

## 2015-05-13 DIAGNOSIS — Z1283 Encounter for screening for malignant neoplasm of skin: Secondary | ICD-10-CM | POA: Diagnosis not present

## 2015-05-23 ENCOUNTER — Other Ambulatory Visit (INDEPENDENT_AMBULATORY_CARE_PROVIDER_SITE_OTHER): Payer: Medicare Other

## 2015-05-23 DIAGNOSIS — E785 Hyperlipidemia, unspecified: Secondary | ICD-10-CM

## 2015-05-23 DIAGNOSIS — E039 Hypothyroidism, unspecified: Secondary | ICD-10-CM

## 2015-05-23 NOTE — Progress Notes (Signed)
Lab only 

## 2015-05-24 ENCOUNTER — Other Ambulatory Visit: Payer: Self-pay | Admitting: Family

## 2015-05-24 LAB — CMP14+EGFR
A/G RATIO: 1.9 (ref 1.1–2.5)
ALBUMIN: 4.4 g/dL (ref 3.5–4.8)
ALT: 17 IU/L (ref 0–32)
AST: 18 IU/L (ref 0–40)
Alkaline Phosphatase: 53 IU/L (ref 39–117)
BILIRUBIN TOTAL: 0.4 mg/dL (ref 0.0–1.2)
BUN/Creatinine Ratio: 26 (ref 11–26)
BUN: 20 mg/dL (ref 8–27)
CHLORIDE: 102 mmol/L (ref 97–108)
CO2: 24 mmol/L (ref 18–29)
CREATININE: 0.77 mg/dL (ref 0.57–1.00)
Calcium: 9.9 mg/dL (ref 8.7–10.3)
GFR, EST AFRICAN AMERICAN: 89 mL/min/{1.73_m2} (ref >59)
GFR, EST NON AFRICAN AMERICAN: 77 mL/min/{1.73_m2} (ref >59)
GLUCOSE: 93 mg/dL (ref 65–99)
Globulin, Total: 2.3 g/dL (ref 1.5–4.5)
Potassium: 4.2 mmol/L (ref 3.5–5.2)
Sodium: 142 mmol/L (ref 134–144)
Total Protein: 6.7 g/dL (ref 6.0–8.5)

## 2015-05-24 LAB — THYROID PANEL WITH TSH
Free Thyroxine Index: 2.7 (ref 1.2–4.9)
T3 Uptake Ratio: 26 % (ref 24–39)
T4, Total: 10.3 ug/dL (ref 4.5–12.0)
TSH: 1.56 u[IU]/mL (ref 0.450–4.500)

## 2015-05-24 LAB — LIPID PANEL
Chol/HDL Ratio: 4.3 ratio units (ref 0.0–4.4)
Cholesterol, Total: 215 mg/dL — ABNORMAL HIGH (ref 100–199)
HDL: 50 mg/dL (ref >39)
LDL Calculated: 143 mg/dL — ABNORMAL HIGH (ref 0–99)
Triglycerides: 109 mg/dL (ref 0–149)
VLDL Cholesterol Cal: 22 mg/dL (ref 5–40)

## 2015-05-29 DIAGNOSIS — Z961 Presence of intraocular lens: Secondary | ICD-10-CM | POA: Diagnosis not present

## 2015-05-29 DIAGNOSIS — H35342 Macular cyst, hole, or pseudohole, left eye: Secondary | ICD-10-CM | POA: Diagnosis not present

## 2015-05-29 DIAGNOSIS — H43811 Vitreous degeneration, right eye: Secondary | ICD-10-CM | POA: Diagnosis not present

## 2015-05-29 DIAGNOSIS — H43393 Other vitreous opacities, bilateral: Secondary | ICD-10-CM | POA: Diagnosis not present

## 2015-05-29 DIAGNOSIS — H35372 Puckering of macula, left eye: Secondary | ICD-10-CM | POA: Diagnosis not present

## 2015-05-30 ENCOUNTER — Encounter: Payer: Self-pay | Admitting: Family

## 2015-05-30 ENCOUNTER — Ambulatory Visit (INDEPENDENT_AMBULATORY_CARE_PROVIDER_SITE_OTHER): Payer: Medicare Other | Admitting: Family

## 2015-05-30 VITALS — BP 124/81 | HR 69 | Temp 97.1°F | Ht 60.0 in | Wt 144.8 lb

## 2015-05-30 DIAGNOSIS — J309 Allergic rhinitis, unspecified: Secondary | ICD-10-CM | POA: Diagnosis not present

## 2015-05-30 DIAGNOSIS — E039 Hypothyroidism, unspecified: Secondary | ICD-10-CM | POA: Diagnosis not present

## 2015-05-30 DIAGNOSIS — E785 Hyperlipidemia, unspecified: Secondary | ICD-10-CM | POA: Diagnosis not present

## 2015-05-30 DIAGNOSIS — J4530 Mild persistent asthma, uncomplicated: Secondary | ICD-10-CM

## 2015-05-30 MED ORDER — LEVOTHYROXINE SODIUM 88 MCG PO TABS: 88.0000 ug | ORAL_TABLET | Freq: Every day | ORAL | Status: DC

## 2015-05-30 NOTE — Progress Notes (Signed)
Subjective:    Patient ID: [name], female    DOB: 06-Mar-1941, 74 y.o.   MRN: [id number]  Pt presents to the office today for chronic follow up. Hyperlipidemia This is a chronic problem. The current episode started more than 1 year ago. The problem is uncontrolled. Recent lipid tests were reviewed and are high. Exacerbating diseases include hypothyroidism. She has no history of diabetes. Pertinent negatives include no leg pain, myalgias or shortness of breath. Current antihyperlipidemic treatment includes diet change and herbal therapy. The current treatment provides mild improvement of lipids. Risk factors for coronary artery disease include dyslipidemia, hypertension, family history and post-menopausal.  Thyroid Problem Presents for follow-up visit. Patient reports no anxiety, diaphoresis, diarrhea, dry skin, fatigue, palpitations or weight gain. The symptoms have been stable. Past treatments include levothyroxine. The treatment provided significant relief. Her past medical history is significant for hyperlipidemia. There is no history of diabetes.  Asthma She complains of frequent throat clearing. There is no cough, difficulty breathing, hemoptysis, shortness of breath or wheezing. This is a chronic problem. The current episode started more than 1 year ago. The problem occurs rarely. The problem has been resolved. Pertinent negatives include no dyspnea on exertion, ear congestion, headaches, myalgias, rhinorrhea, sneezing or trouble swallowing. Her symptoms are aggravated by change in weather. Her symptoms are alleviated by rest and steroid inhaler. She reports complete improvement on treatment. Her past medical history is significant for asthma.      Review of Systems  Constitutional: Negative.  Negative for weight gain, diaphoresis and fatigue.  HENT: Negative.  Negative for rhinorrhea, sneezing and trouble swallowing.   Eyes: Negative.   Respiratory: Negative.  Negative for cough,  hemoptysis, shortness of breath and wheezing.   Cardiovascular: Negative.  Negative for dyspnea on exertion and palpitations.  Gastrointestinal: Negative.  Negative for diarrhea.  Endocrine: Negative.   Genitourinary: Negative.   Musculoskeletal: Negative.  Negative for myalgias.  Neurological: Negative.  Negative for headaches.  Hematological: Negative.   Psychiatric/Behavioral: Negative.  The patient is not nervous/anxious.   All other systems reviewed and are negative.      Objective:   Physical Exam  Constitutional: She is oriented to person, place, and time. She appears well-developed and well-nourished. No distress.  HENT:  Head: Normocephalic and atraumatic.  Right Ear: External ear normal.  Left Ear: External ear normal.  Nose: Nose normal.  Mouth/Throat: Oropharynx is clear and moist.  Eyes: Pupils are equal, round, and reactive to light.  Neck: Normal range of motion. Neck supple. No thyromegaly present.  Cardiovascular: Normal rate, regular rhythm, normal heart sounds and intact distal pulses.   No murmur heard. Pulmonary/Chest: Effort normal and breath sounds normal. No respiratory distress. She has no wheezes.  Abdominal: Soft. Bowel sounds are normal. She exhibits no distension. There is no tenderness.  Musculoskeletal: Normal range of motion. She exhibits no edema or tenderness.  Neurological: She is alert and oriented to person, place, and time. She has normal reflexes. No cranial nerve deficit.  Skin: Skin is warm and dry.  Psychiatric: She has a normal mood and affect. Her behavior is normal. Judgment and thought content normal.  Vitals reviewed.   BP 124/81 mmHg  Pulse 69  Temp(Src) 97.1 F (36.2 C) (Oral)  Ht 5' (1.524 m)  Wt 144 lb 12.8 oz (65.681 kg)  BMI 28.28 kg/m2       Assessment & Plan:  1. Allergic rhinitis, unspecified allergic rhinitis type  2. Mild persistent asthma,  uncomplicated  3. Hypothyroidism, unspecified hypothyroidism type -  levothyroxine (SYNTHROID, LEVOTHROID) 88 MCG tablet; Take 1 tablet (88 mcg total) by mouth daily.  Dispense: 90 tablet; Refill: 3  4. HLD (hyperlipidemia)    Continue all meds Labs discussed Health Maintenance reviewed Diet and exercise encouraged RTO 6 months  Jannifer Rodney, FNP

## 2015-05-30 NOTE — Patient Instructions (Signed)

## 2015-06-18 DIAGNOSIS — R921 Mammographic calcification found on diagnostic imaging of breast: Secondary | ICD-10-CM | POA: Diagnosis not present

## 2015-06-27 ENCOUNTER — Encounter: Payer: Self-pay | Admitting: Family Medicine

## 2015-07-02 ENCOUNTER — Other Ambulatory Visit: Payer: Self-pay | Admitting: Family Medicine

## 2015-07-02 DIAGNOSIS — E039 Hypothyroidism, unspecified: Secondary | ICD-10-CM

## 2015-07-02 MED ORDER — LEVOTHYROXINE SODIUM 88 MCG PO TABS: 88.0000 ug | ORAL_TABLET | Freq: Every day | ORAL | Status: DC

## 2015-07-02 NOTE — Telephone Encounter (Signed)
done

## 2015-07-18 ENCOUNTER — Ambulatory Visit (INDEPENDENT_AMBULATORY_CARE_PROVIDER_SITE_OTHER): Payer: Medicare Other

## 2015-07-18 ENCOUNTER — Ambulatory Visit (INDEPENDENT_AMBULATORY_CARE_PROVIDER_SITE_OTHER): Payer: Medicare Other | Admitting: Orthopedic Surgery

## 2015-07-18 VITALS — BP 156/84 | Ht 60.0 in | Wt 144.8 lb

## 2015-07-18 DIAGNOSIS — M79672 Pain in left foot: Secondary | ICD-10-CM | POA: Diagnosis not present

## 2015-07-18 DIAGNOSIS — R2242 Localized swelling, mass and lump, left lower limb: Secondary | ICD-10-CM

## 2015-07-18 DIAGNOSIS — M21612 Bunion of left foot: Secondary | ICD-10-CM

## 2015-07-18 DIAGNOSIS — M2012 Hallux valgus (acquired), left foot: Secondary | ICD-10-CM | POA: Diagnosis not present

## 2015-07-18 DIAGNOSIS — M2041 Other hammer toe(s) (acquired), right foot: Secondary | ICD-10-CM

## 2015-07-18 NOTE — Progress Notes (Signed)
Patient ID: [name], female   DOB: 05/03/1941, 74 y.o.   MRN: [id number]  New patient   Chief Complaint  Patient presents with  . Foot Problem    pain in ball of foot, patient feels a knot     [name] is a 74 y.o. female.   HPI 74 year old female complains of foot deformity and plantar pain under the second and third metatarsals with occasional burning but primarily pain and throbbing which he rates 5 out of 10 no previous treatment worse with walking. Had a history of requiring bunion surgery when she was 74 years old and declined at that time.  She has modified her shoes and wears wide shoes and round toes in her shoe choices  She has a review of systems reported as disturbance breathing issues dizziness and vertigo but otherwise all systems were normal. Review of Systems See hpi  Past Medical History  Diagnosis Date  . Postinflammatory pulmonary fibrosis   . Hypothyroidism   . Asthma   . Gallstone pancreatitis   . Vertigo     Past Surgical History  Procedure Laterality Date  . Cholecystectomy  2010  . Tubal ligation    . Cataract extraction w/phaco  10/01/2011    Procedure: CATARACT EXTRACTION PHACO AND INTRAOCULAR LENS PLACEMENT (IOC);  Surgeon: Gemma Payor;  Location: [facility];  Service: Ophthalmology;  Laterality: Left;  CDE:13.74  . Cataract extraction w/phaco  10/22/2011    Procedure: CATARACT EXTRACTION PHACO AND INTRAOCULAR LENS PLACEMENT (IOC);  Surgeon: Gemma Payor;  Location: [facility];  Service: Ophthalmology;  Laterality: Right;  CDE:9.98    Family History  Problem Relation Age of Onset  . Heart failure Mother   . Emphysema Mother   . Other Father     blood clot in brain  . Breast cancer Sister   . Breast cancer Sister   . Asthma Sister   . Anesthesia problems Neg Hx   . Hypotension Neg Hx   . Malignant hyperthermia Neg Hx   . Pseudochol deficiency Neg Hx     Social History Social History  Substance Use Topics  . Smoking status: Never  Smoker   . Smokeless tobacco: Never Used  . Alcohol Use: No    Allergies  Allergen Reactions  . Sulfonamide Derivatives     Current Outpatient Prescriptions  Medication Sig Dispense Refill  . albuterol (PROAIR HFA) 108 (90 BASE) MCG/ACT inhaler Inhale 2 puffs into the lungs every 6 (six) hours as needed. Shortness of breath 1 Inhaler 6  . aspirin EC 81 MG tablet Take 81 mg by mouth daily.      . Cholecalciferol (VITAMIN D-3) 1000 UNITS CAPS Take by mouth daily.     . fish oil-omega-3 fatty acids 1000 MG capsule Take 1 g by mouth 2 (two) times daily.     Marland Kitchen levothyroxine (SYNTHROID, LEVOTHROID) 88 MCG tablet Take 1 tablet (88 mcg total) by mouth daily. 90 tablet 2  . meclizine (ANTIVERT) 25 MG tablet Take 1 tablet (25 mg total) by mouth 3 (three) times daily as needed for dizziness. 90 tablet 3  . Mometasone Furoate (ASMANEX HFA) 100 MCG/ACT AERO Inhale 2 puffs into the lungs daily. Use in place of Qvar, use samples 13 g 1  . Multiple Vitamins-Minerals (CENTRUM SILVER PO) Take 1 tablet by mouth daily.       No current facility-administered medications for this visit.       Physical Exam Blood pressure 156/84, height 5' (1.524  m), weight 144 lb 12.8 oz (65.681 kg). Physical Exam The patient is well developed well nourished and well groomed. Orientation to person place and time is normal  Mood is pleasant. Ambulatory status is normal without a limp Skin remains intact without laceration ulceration or erythema Gross motor exam is intact without atrophy. Muscle tone normal grade 5 motor strength Neurovascular exam remains intact Inspection inspection of the right and left foot shows that she has bilateral bunion deformities she's has a second toe with plantar subluxation of the metatarsal head and callus formation. The bunion or hallux is not fully correctable passively. No crepitance on joint range of motion and there is no instability   Data Reviewed  X-rays show hallux deformity  no significant midfoot arthritis  Assessment   Bunion Hammertoe Metatarsal head subluxation Plan   Referral to podiatrist for further definitive care

## 2015-07-19 ENCOUNTER — Telehealth: Payer: Self-pay | Admitting: *Deleted

## 2015-07-19 ENCOUNTER — Other Ambulatory Visit: Payer: Self-pay | Admitting: *Deleted

## 2015-07-19 DIAGNOSIS — M2040 Other hammer toe(s) (acquired), unspecified foot: Secondary | ICD-10-CM

## 2015-07-19 NOTE — Telephone Encounter (Signed)
REFERRAL FAXED TO DR MCKINNEY 

## 2015-07-22 ENCOUNTER — Encounter: Payer: Self-pay | Admitting: Critical Care Medicine

## 2015-07-22 ENCOUNTER — Ambulatory Visit (INDEPENDENT_AMBULATORY_CARE_PROVIDER_SITE_OTHER): Payer: Medicare Other | Admitting: Critical Care Medicine

## 2015-07-22 VITALS — BP 150/82 | HR 66 | Temp 97.7°F | Ht 60.0 in | Wt 142.6 lb

## 2015-07-22 DIAGNOSIS — Z23 Encounter for immunization: Secondary | ICD-10-CM

## 2015-07-22 DIAGNOSIS — J4540 Moderate persistent asthma, uncomplicated: Secondary | ICD-10-CM

## 2015-07-22 DIAGNOSIS — J45909 Unspecified asthma, uncomplicated: Secondary | ICD-10-CM | POA: Diagnosis not present

## 2015-07-22 DIAGNOSIS — R0982 Postnasal drip: Secondary | ICD-10-CM | POA: Diagnosis not present

## 2015-07-22 DIAGNOSIS — J309 Allergic rhinitis, unspecified: Secondary | ICD-10-CM

## 2015-07-22 MED ORDER — MOMETASONE FUROATE 100 MCG/ACT IN AERO: 2.0000 | INHALATION_SPRAY | Freq: Every day | RESPIRATORY_TRACT | Status: DC

## 2015-07-22 NOTE — Patient Instructions (Signed)
Flu vaccine was given Samples of asmanex for two months supply given No other changes Return 6 months Praveen Mannam

## 2015-07-22 NOTE — Progress Notes (Signed)
Subjective:    Patient ID: [name], female    DOB: 1941-04-05, 74 y.o.   MRN: [id number]  HPI  07/22/2015 Chief Complaint  Patient presents with  . 4 month follow up    Breathing has improved with Asmanex.  No SOB, wheezing, chest tightness, CP, or cough.    Doing well PUL ASTHMA HISTORY 07/22/2015 09/05/2014 02/21/2014 02/21/2013 07/01/2012 12/23/2011 06/17/2011  Symptoms 0-2 days/week 0-2 days/week 0-2 days/week 0-2 days/week 0-2 days/week 0-2 days/week 0-2 days/week  Nighttime awakenings 0-2/month 0-2/month 0-2/month 0-2/month 0-2/month 0-2/month 0-2/month  Interference with activity No limitations No limitations No limitations No limitations No limitations No limitations No limitations  SABA use 0-2 days/wk 0-2 days/wk 0-2 days/wk 0-2 days/wk 0-2 days/wk 0-2 days/wk 0-2 days/wk  Exacerbations requiring oral steroids 0-1 / year 0-1 / year 0-1 / year - 0-1 / year 0-1 / year 0-1 / year    Pt denies any significant sore throat, nasal congestion or excess secretions, fever, chills, sweats, unintended weight loss, pleurtic or exertional chest pain, orthopnea PND, or leg swelling Pt denies any increase in rescue therapy over baseline, denies waking up needing it or having any early am or nocturnal exacerbations of coughing/wheezing/or dyspnea. Pt also denies any obvious fluctuation in symptoms with  weather or environmental change or other alleviating or aggravating factors   Current Medications, Allergies, Complete Past Medical History, Past Surgical History, Family History, and Social History were reviewed in [employer] electronic medical record per todays encounter:  07/22/2015    Review of Systems  Constitutional: Negative.   HENT: Negative.  Negative for ear pain, postnasal drip, rhinorrhea, sinus pressure, sore throat, trouble swallowing and voice change.   Eyes: Negative.   Respiratory: Negative.  Negative for apnea, cough, choking, chest tightness, shortness of breath,  wheezing and stridor.   Cardiovascular: Negative.  Negative for chest pain, palpitations and leg swelling.  Gastrointestinal: Negative.  Negative for nausea, vomiting, abdominal pain and abdominal distention.  Genitourinary: Negative.   Musculoskeletal: Negative.  Negative for myalgias and arthralgias.  Skin: Negative.  Negative for rash.  Allergic/Immunologic: Negative.  Negative for environmental allergies and food allergies.  Neurological: Negative.  Negative for dizziness, syncope, weakness and headaches.  Hematological: Negative.  Negative for adenopathy. Does not bruise/bleed easily.  Psychiatric/Behavioral: Negative.  Negative for sleep disturbance and agitation. The patient is not nervous/anxious.        Objective:   Physical Exam  Filed Vitals:   07/22/15 1019  BP: 150/82  Pulse: 66  Temp: 97.7 F (36.5 C)  TempSrc: Oral  Height: 5' (1.524 m)  Weight: 142 lb 9.6 oz (64.683 kg)  SpO2: 98%    Gen: Pleasant, well-nourished, in no distress,  normal affect  ENT: No lesions,  mouth clear,  oropharynx clear, no postnasal drip  Neck: No JVD, no TMG, no carotid bruits  Lungs: No use of accessory muscles, no dullness to percussion, clear without rales or rhonchi  Cardiovascular: RRR, heart sounds normal, no murmur or gallops, no peripheral edema  Abdomen: soft and NT, no HSM,  BS normal  Musculoskeletal: No deformities, no cyanosis or clubbing  Neuro: alert, non focal  Skin: Warm, no lesions or rashes  No results found.       Assessment & Plan:  I personally reviewed all images and lab data in the [facility] system as well as any outside material available during this office visit and agree with the  radiology impressions.   Moderate persistent asthma in  adult without complication Moderate persistent asthma without complication stable at this time Plan Maintain on Asmanex 2 puffs daily Maintain proair daily as needed Flu vaccine was administered    [name] was  seen today for 4 month follow up.  Diagnoses and all orders for this visit:  Asthma, moderate persistent, uncomplicated  Allergic rhinitis with postnasal drip  Encounter for immunization  Moderate persistent asthma in adult without complication  Other orders -     Mometasone Furoate (ASMANEX HFA) 100 MCG/ACT AERO; Inhale 2 puffs into the lungs daily. -     Flu Vaccine QUAD 36+ mos IM

## 2015-07-22 NOTE — Assessment & Plan Note (Signed)
Moderate persistent asthma without complication stable at this time Plan Maintain on Asmanex 2 puffs daily Maintain proair daily as needed Flu vaccine was administered

## 2015-07-26 ENCOUNTER — Telehealth: Payer: Self-pay | Admitting: *Deleted

## 2015-07-26 NOTE — Telephone Encounter (Signed)
Initiated PA for Asmanex 100 thru Cover My Meds. Key: [id number]. Pt ID: [id number]  Sent for review. Will await response.

## 2015-07-29 NOTE — Telephone Encounter (Signed)
Received approval for Asmanex HFA. Valid thru 10/25/2016. Pharmacy informed.  Ref# [id number].

## 2015-08-01 DIAGNOSIS — M2011 Hallux valgus (acquired), right foot: Secondary | ICD-10-CM | POA: Diagnosis not present

## 2015-08-01 DIAGNOSIS — M2012 Hallux valgus (acquired), left foot: Secondary | ICD-10-CM | POA: Diagnosis not present

## 2015-08-07 ENCOUNTER — Telehealth: Payer: Self-pay | Admitting: Pulmonary Disease

## 2015-08-07 NOTE — Telephone Encounter (Signed)
Left message for pt to call back  °

## 2015-08-08 NOTE — Telephone Encounter (Signed)
Patient says that Qvar is now approved through her insurance and she would like to go back on the Qvar instead of the Asmanex.     PM - please advise.

## 2015-08-08 NOTE — Telephone Encounter (Signed)
Pt cb, [phone]

## 2015-08-08 NOTE — Telephone Encounter (Signed)
lomtcb x1 

## 2015-08-08 NOTE — Telephone Encounter (Signed)
Pt cb, please cb at previous number listed °

## 2015-08-09 LAB — FECAL OCCULT BLOOD, GUAIAC: FECAL OCCULT BLD: NEGATIVE

## 2015-08-12 MED ORDER — BECLOMETHASONE DIPROPIONATE 80 MCG/ACT IN AERS: 2.0000 | INHALATION_SPRAY | Freq: Two times a day (BID) | RESPIRATORY_TRACT | Status: DC

## 2015-08-12 NOTE — Telephone Encounter (Signed)
Called spoke with patient to inform her that [id number] [place] Nothing further needed; will sign off

## 2015-08-12 NOTE — Telephone Encounter (Signed)
Yes. That is fine. Please order.

## 2015-08-13 DIAGNOSIS — M2011 Hallux valgus (acquired), right foot: Secondary | ICD-10-CM | POA: Diagnosis not present

## 2015-08-13 DIAGNOSIS — M2012 Hallux valgus (acquired), left foot: Secondary | ICD-10-CM | POA: Diagnosis not present

## 2015-08-21 ENCOUNTER — Encounter: Payer: Self-pay | Admitting: *Deleted

## 2015-10-03 DIAGNOSIS — H26493 Other secondary cataract, bilateral: Secondary | ICD-10-CM | POA: Diagnosis not present

## 2015-10-03 DIAGNOSIS — H43813 Vitreous degeneration, bilateral: Secondary | ICD-10-CM | POA: Diagnosis not present

## 2015-10-03 DIAGNOSIS — H35342 Macular cyst, hole, or pseudohole, left eye: Secondary | ICD-10-CM | POA: Diagnosis not present

## 2015-10-03 DIAGNOSIS — Z961 Presence of intraocular lens: Secondary | ICD-10-CM | POA: Diagnosis not present

## 2015-10-15 DIAGNOSIS — H903 Sensorineural hearing loss, bilateral: Secondary | ICD-10-CM | POA: Diagnosis not present

## 2015-10-15 DIAGNOSIS — H73822 Atrophic nonflaccid tympanic membrane, left ear: Secondary | ICD-10-CM | POA: Diagnosis not present

## 2015-10-15 DIAGNOSIS — H6121 Impacted cerumen, right ear: Secondary | ICD-10-CM | POA: Diagnosis not present

## 2015-11-21 ENCOUNTER — Other Ambulatory Visit: Payer: Medicare HMO

## 2015-11-21 ENCOUNTER — Other Ambulatory Visit: Payer: Self-pay | Admitting: Family

## 2015-11-21 DIAGNOSIS — E785 Hyperlipidemia, unspecified: Secondary | ICD-10-CM

## 2015-11-21 DIAGNOSIS — E559 Vitamin D deficiency, unspecified: Secondary | ICD-10-CM

## 2015-11-21 DIAGNOSIS — E039 Hypothyroidism, unspecified: Secondary | ICD-10-CM

## 2015-11-21 DIAGNOSIS — J309 Allergic rhinitis, unspecified: Secondary | ICD-10-CM

## 2015-11-21 HISTORY — DX: Vitamin D deficiency, unspecified: E55.9

## 2015-11-22 LAB — CMP14+EGFR
A/G RATIO: 1.8 (ref 1.1–2.5)
ALK PHOS: 65 IU/L (ref 39–117)
ALT: 15 IU/L (ref 0–32)
AST: 20 IU/L (ref 0–40)
Albumin: 4.3 g/dL (ref 3.5–4.8)
BUN/Creatinine Ratio: 18 (ref 11–26)
BUN: 14 mg/dL (ref 8–27)
Bilirubin Total: 0.3 mg/dL (ref 0.0–1.2)
CHLORIDE: 104 mmol/L (ref 96–106)
CO2: 24 mmol/L (ref 18–29)
Calcium: 9.7 mg/dL (ref 8.7–10.3)
Creatinine, Ser: 0.79 mg/dL (ref 0.57–1.00)
GFR calc Af Amer: 86 mL/min/{1.73_m2} (ref >59)
GFR, EST NON AFRICAN AMERICAN: 74 mL/min/{1.73_m2} (ref >59)
GLOBULIN, TOTAL: 2.4 g/dL (ref 1.5–4.5)
Glucose: 92 mg/dL (ref 65–99)
POTASSIUM: 4.2 mmol/L (ref 3.5–5.2)
SODIUM: 144 mmol/L (ref 134–144)
Total Protein: 6.7 g/dL (ref 6.0–8.5)

## 2015-11-22 LAB — LIPID PANEL
CHOL/HDL RATIO: 4.1 ratio (ref 0.0–4.4)
Cholesterol, Total: 209 mg/dL — ABNORMAL HIGH (ref 100–199)
HDL: 51 mg/dL (ref >39)
LDL Calculated: 139 mg/dL — ABNORMAL HIGH (ref 0–99)
TRIGLYCERIDES: 94 mg/dL (ref 0–149)
VLDL Cholesterol Cal: 19 mg/dL (ref 5–40)

## 2015-11-22 LAB — THYROID PANEL WITH TSH
FREE THYROXINE INDEX: 3 (ref 1.2–4.9)
T3 Uptake Ratio: 27 % (ref 24–39)
T4, Total: 11.1 ug/dL (ref 4.5–12.0)
TSH: 2.95 u[IU]/mL (ref 0.450–4.500)

## 2015-11-22 LAB — VITAMIN D 25 HYDROXY (VIT D DEFICIENCY, FRACTURES): VIT D 25 HYDROXY: 54.1 ng/mL (ref 30.0–100.0)

## 2015-12-02 ENCOUNTER — Encounter: Payer: Self-pay | Admitting: Family

## 2015-12-02 ENCOUNTER — Ambulatory Visit (INDEPENDENT_AMBULATORY_CARE_PROVIDER_SITE_OTHER): Payer: Medicare HMO

## 2015-12-02 ENCOUNTER — Ambulatory Visit (INDEPENDENT_AMBULATORY_CARE_PROVIDER_SITE_OTHER): Payer: Medicare HMO | Admitting: Family

## 2015-12-02 VITALS — BP 137/83 | HR 75 | Temp 97.0°F | Ht 60.0 in | Wt 141.2 lb

## 2015-12-02 DIAGNOSIS — J4540 Moderate persistent asthma, uncomplicated: Secondary | ICD-10-CM | POA: Diagnosis not present

## 2015-12-02 DIAGNOSIS — Y92009 Unspecified place in unspecified non-institutional (private) residence as the place of occurrence of the external cause: Principal | ICD-10-CM

## 2015-12-02 DIAGNOSIS — M25561 Pain in right knee: Secondary | ICD-10-CM

## 2015-12-02 DIAGNOSIS — E039 Hypothyroidism, unspecified: Secondary | ICD-10-CM

## 2015-12-02 DIAGNOSIS — E785 Hyperlipidemia, unspecified: Secondary | ICD-10-CM | POA: Diagnosis not present

## 2015-12-02 DIAGNOSIS — B009 Herpesviral infection, unspecified: Secondary | ICD-10-CM

## 2015-12-02 DIAGNOSIS — M810 Age-related osteoporosis without current pathological fracture: Secondary | ICD-10-CM

## 2015-12-02 DIAGNOSIS — E559 Vitamin D deficiency, unspecified: Secondary | ICD-10-CM

## 2015-12-02 DIAGNOSIS — J309 Allergic rhinitis, unspecified: Secondary | ICD-10-CM | POA: Diagnosis not present

## 2015-12-02 DIAGNOSIS — Z780 Asymptomatic menopausal state: Secondary | ICD-10-CM

## 2015-12-02 DIAGNOSIS — S8001XA Contusion of right knee, initial encounter: Secondary | ICD-10-CM

## 2015-12-02 DIAGNOSIS — W19XXXA Unspecified fall, initial encounter: Secondary | ICD-10-CM

## 2015-12-02 MED ORDER — FAMCICLOVIR 500 MG PO TABS: 1500.0000 mg | ORAL_TABLET | Freq: Once | ORAL | Status: DC

## 2015-12-02 MED ORDER — NAPROXEN 500 MG PO TABS: 500.0000 mg | ORAL_TABLET | Freq: Two times a day (BID) | ORAL | Status: DC

## 2015-12-02 NOTE — Progress Notes (Signed)
Subjective:    Patient ID: [name], female    DOB: 09-27-1941, 75 y.o.   MRN: [id number]  Pt presents to the office today for chronic follow up. Hyperlipidemia This is a chronic problem. The current episode started more than 1 year ago. The problem is uncontrolled. Recent lipid tests were reviewed and are high. Exacerbating diseases include hypothyroidism. She has no history of diabetes. Pertinent negatives include no leg pain, myalgias or shortness of breath. Current antihyperlipidemic treatment includes diet change and herbal therapy. The current treatment provides mild improvement of lipids. Risk factors for coronary artery disease include dyslipidemia, hypertension, family history and post-menopausal.  Knee Pain  The incident occurred 6 to 12 hours ago. The injury mechanism was a fall. The pain is present in the right knee. The quality of the pain is described as aching. The pain is at a severity of 10/10. The pain is moderate. The pain has been constant since onset. Associated symptoms include an inability to bear weight. Pertinent negatives include no loss of motion, numbness or tingling. She reports no foreign bodies present. The symptoms are aggravated by movement and weight bearing. She has tried acetaminophen and ice for the symptoms. The treatment provided mild relief.  Thyroid Problem Presents for follow-up visit. Patient reports no anxiety, diaphoresis, diarrhea, dry skin, fatigue, palpitations or weight gain. The symptoms have been stable. Past treatments include levothyroxine. The treatment provided significant relief. Her past medical history is significant for hyperlipidemia. There is no history of diabetes.  Asthma She complains of frequent throat clearing. There is no cough, difficulty breathing, hemoptysis, shortness of breath or wheezing. This is a chronic problem. The current episode started more than 1 year ago. The problem occurs rarely. The problem has been resolved.  Pertinent negatives include no dyspnea on exertion, ear congestion, headaches, myalgias, rhinorrhea, sneezing or trouble swallowing. Her symptoms are aggravated by change in weather. Her symptoms are alleviated by rest and steroid inhaler. She reports complete improvement on treatment. Her past medical history is significant for asthma.      Review of Systems  Constitutional: Negative.  Negative for weight gain, diaphoresis and fatigue.  HENT: Negative.  Negative for rhinorrhea, sneezing and trouble swallowing.   Eyes: Negative.   Respiratory: Negative.  Negative for cough, hemoptysis, shortness of breath and wheezing.   Cardiovascular: Negative.  Negative for dyspnea on exertion and palpitations.  Gastrointestinal: Negative.  Negative for diarrhea.  Endocrine: Negative.   Genitourinary: Negative.   Musculoskeletal: Negative.  Negative for myalgias.  Neurological: Negative.  Negative for tingling, numbness and headaches.  Hematological: Negative.   Psychiatric/Behavioral: Negative.  The patient is not nervous/anxious.   All other systems reviewed and are negative.      Objective:   Physical Exam  Constitutional: She is oriented to person, place, and time. She appears well-developed and well-nourished. No distress.  HENT:  Head: Normocephalic and atraumatic.  Right Ear: External ear normal.  Left Ear: External ear normal.  Nose: Nose normal.  Mouth/Throat: Oropharynx is clear and moist.  Eyes: Pupils are equal, round, and reactive to light.  Neck: Normal range of motion. Neck supple. No thyromegaly present.  Cardiovascular: Normal rate, regular rhythm, normal heart sounds and intact distal pulses.   No murmur heard. Pulmonary/Chest: Effort normal and breath sounds normal. No respiratory distress. She has no wheezes.  Abdominal: Soft. Bowel sounds are normal. She exhibits no distension. There is no tenderness.  Musculoskeletal: Normal range of motion. She exhibits edema (2+  in  right knee) and tenderness.  Neurological: She is alert and oriented to person, place, and time. She has normal reflexes. No cranial nerve deficit.  Skin: Skin is warm and dry.  Psychiatric: She has a normal mood and affect. Her behavior is normal. Judgment and thought content normal.  Vitals reviewed.   BP 137/83 mmHg  Pulse 75  Temp(Src) 97 F (36.1 C) (Oral)  Ht 5' (1.524 m)  Wt 141 lb 3.2 oz (64.048 kg)  BMI 27.58 kg/m2  Right knee x-ray- No fracture noted Preliminary reading by Evelina Dun, FNP [facility]      Assessment & Plan:  1. Allergic rhinitis, unspecified allergic rhinitis type - CMP14+EGFR  2. Moderate persistent asthma in adult without complication - HIP54+SYSD  3. Hypothyroidism, unspecified hypothyroidism type - CMP14+EGFR - Thyroid Panel With TSH  4. HLD (hyperlipidemia) - CMP14+EGFR - Lipid panel  5. Vitamin D deficiency - CMP14+EGFR - VITAMIN D 25 Hydroxy (Vit-D Deficiency, Fractures)  6. Fall at home, initial encounter -Falls precaution discussed - CMP14+EGFR - DG Knee 1-2 Views Right; Future  7. Right knee pain - CMP14+EGFR - DG Knee 1-2 Views Right; Future  8. Post-menopausal - CMP14+EGFR - DG Bone Density; Future - VITAMIN D 25 Hydroxy (Vit-D Deficiency, Fractures)  9. Contusion of right knee, initial encounter -Rest -Ice -Keep elevated when possible - naproxen (NAPROSYN) 500 MG tablet; Take 1 tablet (500 mg total) by mouth 2 (two) times daily with a meal.  Dispense: 30 tablet; Refill: 0  10. Osteoporosis -PT to set up appt with Tammy - VITAMIN D 25 Hydroxy (Vit-D Deficiency, Fractures)   Continue all meds Labs pending Health Maintenance reviewed Diet and exercise encouraged RTO 6 months  Evelina Dun, FNP

## 2015-12-02 NOTE — Patient Instructions (Signed)

## 2015-12-02 NOTE — Addendum Note (Signed)
Addended by: Jannifer Rodney A on: 12/02/2015 04:55 PM   Modules accepted: Orders

## 2015-12-12 ENCOUNTER — Encounter: Payer: Self-pay | Admitting: Pharmacist

## 2015-12-12 ENCOUNTER — Ambulatory Visit (INDEPENDENT_AMBULATORY_CARE_PROVIDER_SITE_OTHER): Payer: Medicare HMO | Admitting: Pharmacist

## 2015-12-12 VITALS — BP 134/80 | HR 74 | Ht 61.0 in | Wt 144.8 lb

## 2015-12-12 DIAGNOSIS — M810 Age-related osteoporosis without current pathological fracture: Secondary | ICD-10-CM | POA: Diagnosis not present

## 2015-12-12 NOTE — Patient Instructions (Signed)
Exercise for Strong Bones  Exercise is important to build and maintain strong bones / bone density.  There are 2 types of exercises that are important to building and maintaining strong bones:  Weight- bearing and muscle-stregthening.  Weight-bearing Exercises  These exercises include activities that make you move against gravity while staying upright. Weight-bearing exercises can be high-impact or low-impact.  High-impact weight-bearing exercises help build bones and keep them strong. If you have broken a bone due to osteoporosis or are at risk of breaking a bone, you may need to avoid high-impact exercises. If you're not sure, you should check with your healthcare provider.  Examples of high-impact weight-bearing exercises are: Dancing  Doing high-impact aerobics  Hiking  Jogging/running  Jumping Rope  Stair climbing  Tennis  Low-impact weight-bearing exercises can also help keep bones strong and are a safe alternative if you cannot do high-impact exercises.   Examples of low-impact weight-bearing exercises are: Using elliptical training machines  Doing low-impact aerobics  Using stair-step machines  Fast walking on a treadmill or outside   Muscle-Strengthening Exercises These exercises include activities where you move your body, a weight or some other resistance against gravity. They are also known as resistance exercises and include: Lifting weights  Using elastic exercise bands  Using weight machines  Lifting your own body weight  Functional movements, such as standing and rising up on your toes  Yoga and Pilates can also improve strength, balance and flexibility. However, certain positions may not be safe for people with osteoporosis or those at increased risk of broken bones. For example, exercises that have you bend forward may increase the chance of breaking a bone in the spine.   Non-Impact Exercises There are other types of exercises that can help prevent falls.   Non-impact exercises can help you to improve balance, posture and how well you move in everyday activities. Some of these exercises include: Balance exercises that strengthen your legs and test your balance, such as Tai Chi, can decrease your risk of falls.  Posture exercises that improve your posture and reduce rounded or "sloping" shoulders can help you decrease the chance of breaking a bone, especially in the spine.  Functional exercises that improve how well you move can help you with everyday activities and decrease your chance of falling and breaking a bone. For example, if you have trouble getting up from a chair or climbing stairs, you should do these activities as exercises.   **A physical therapist can teach you balance, posture and functional exercises. He/she can also help you learn which exercises are safe and appropriate for you.  [facility] has a physical therapy office in [place] in front of our office and referrals can be made for assessments and treatment as needed and strength and balance training.  If you would like to have an assessment with Chad and our physical therapy team please let a nurse or provider know.    

## 2015-12-12 NOTE — Progress Notes (Signed)
Osteoporosis Clinic Current Height: Height:  (154.9 cm)      Max Lifetime Height:   Current Weight: Weight: 144 lb 12 oz (65.658 kg)       Ethnicity:Caucasian  BP: BP: 134/80 mmHg     HR:  Pulse Rate: 74      HPI: Previously diagnosed with osteopenia in 2001.  She was started on Fosamax but did not continue due to side effects - though she cannot recall type of SE experienced. Her last DEXA prior to the one done 12/02/15 was 12/29/2001 or 14 years ago. Now DEXA / T-Scores indicate osteoporosis.  Back Pain?  No       Kyphosis?  No Prior fracture?  No Med(s) for Osteoporosis/Osteopenia:  none Med(s) previously tried for Osteoporosis/Osteopenia:  Fosamax                                                             PMH: Age at menopause:  75yo Hysterectomy?  No Oophorectomy?  No HRT? No Steroid Use?  Yes - Current.  Type/duration: inhaled corticosteroid Thyroid med?  Yes History of cancer?  No History of digestive disorders (ie Crohn's)?  No Current or previous eating disorders?  No Last Vitamin D Result:  54.1 (11/22/2015) Last GFR Result:  74 (11/22/2015)   FH/SH: Family history of osteoporosis?  No Parent with history of hip fracture?  No Family history of breast cancer?  Yes - 2 sisters with breast cancer Exercise?  No -  Due to pain in left foot - has appt to see ortho regarding possible removal of bunions Smoking?  No Alcohol?  No    Calcium Assessment Calcium Intake  # of servings/day  Calcium mg  Milk (8 oz) 1  x  300  = 300  Yogurt (4 oz) 0 x  200 = 0  Cheese (1 oz) 0.5 x  200 =   Other Calcium sources     Ca supplement MVI =    Estimated calcium intake per day     DEXA Results Date of Test T-Score for AP Spine L1-L4 T-Score for Neck of Left Hip T-Score for Total Left Hip T-Score for Total Right Hip  12/02/2015 -0.9 -2.6 -1.6 -1.3  01/02/2002 -1.9 -1.8 -1.0 --  12/29/1999 -2.2 -1.8 -1.1 --         Assessment: Osteoporosis with  low calcium intake and adequate vitamin D  Recommendations: 1.   Discussed BMD  / DEXA results and discussed high fracture risk(especially hip fracture).  Recommended retry bisphosphonate therapy or other options but patient refused any prescription medication therapy for osteoporosis.   2.  recommend calcium  daily through supplementation or diet.  Patient to add either 1 serving of milk or yogurt daily to achieve  daily 3.  recommend weight bearing exercise - 30 minutes at least 4 days per week.   4.  Counseled and educated about fall risk and prevention.  Recheck DEXA:  2 years - stressed that recheck in 2 years if very important.  Time spent counseling patient:  40 minutes   Henrene Pastor, PharmD, CPP

## 2016-01-14 LAB — HM MAMMOGRAPHY

## 2016-01-21 ENCOUNTER — Encounter: Payer: Self-pay | Admitting: Pulmonary Disease

## 2016-01-21 ENCOUNTER — Ambulatory Visit (INDEPENDENT_AMBULATORY_CARE_PROVIDER_SITE_OTHER): Payer: Medicare HMO | Admitting: Pulmonary Disease

## 2016-01-21 VITALS — BP 106/64 | HR 85 | Temp 98.5°F | Ht 60.0 in | Wt 140.6 lb

## 2016-01-21 DIAGNOSIS — J111 Influenza due to unidentified influenza virus with other respiratory manifestations: Secondary | ICD-10-CM | POA: Diagnosis not present

## 2016-01-21 DIAGNOSIS — J4540 Moderate persistent asthma, uncomplicated: Secondary | ICD-10-CM | POA: Diagnosis not present

## 2016-01-21 LAB — POCT INFLUENZA A/B
INFLUENZA B, POC: NEGATIVE
Influenza A, POC: POSITIVE — AB

## 2016-01-21 MED ORDER — OSELTAMIVIR PHOSPHATE 75 MG PO CAPS: 75.0000 mg | ORAL_CAPSULE | Freq: Two times a day (BID) | ORAL | Status: DC

## 2016-01-21 NOTE — Progress Notes (Signed)
   Subjective:    Patient ID: [name], female    DOB: 08-Oct-1941, 75 y.o.   MRN: [id number]  HPI Follow-up for management of asthma.  [name] is a 75 year old with moderate persistent asthma. She is a former patient of Dr. Delford FieldWright. Her symptoms are well controlled on steroid inhaler. She does not need to use her albuterol rescue inhaler.  Husband has been diagnosed with the flu this week. She complains of runny nose and nonproductive cough since this a.m. Denies any dyspnea, wheezing, mucus production, hemoptysis.  Social History: She is a never smoker, no alcohol, drug use.  Family History: Sister-asthma, breast cancer. Brother-lung cancer. Mother-emphysema, heart failure.  Past Medical History  Diagnosis Date  . Postinflammatory pulmonary fibrosis (HCC)   . Hypothyroidism   . Asthma   . Gallstone pancreatitis   . Vertigo   . Osteoporosis     Current outpatient prescriptions:  .  albuterol (PROAIR HFA) 108 (90 BASE) MCG/ACT inhaler, Inhale 2 puffs into the lungs every 6 (six) hours as needed. Shortness of breath, Disp: 1 Inhaler, Rfl: 6 .  aspirin EC 81 MG tablet, Take 81 mg by mouth daily.  , Disp: , Rfl:  .  beclomethasone (QVAR) 80 MCG/ACT inhaler, Inhale 2 puffs into the lungs 2 (two) times daily., Disp: 8.7 g, Rfl: 5 .  Cholecalciferol (VITAMIN D-3) 1000 UNITS CAPS, Take by mouth daily. , Disp: , Rfl:  .  levothyroxine (SYNTHROID, LEVOTHROID) 88 MCG tablet, Take 1 tablet (88 mcg total) by mouth daily., Disp: 90 tablet, Rfl: 2 .  meclizine (ANTIVERT) 25 MG tablet, Take 1 tablet (25 mg total) by mouth 3 (three) times daily as needed for dizziness., Disp: 90 tablet, Rfl: 3 .  Multiple Vitamins-Minerals (CENTRUM SILVER PO), Take 1 tablet by mouth daily. Reported on 12/12/2015, Disp: , Rfl:  .  naproxen (NAPROSYN) 500 MG tablet, Take 1 tablet (500 mg total) by mouth 2 (two) times daily with a meal., Disp: 30 tablet, Rfl: 0  Review of Systems Cough, runny nose. No  sputum, fevers, chills, body aches. No chest pain, palpitation. No nausea, vomiting, diarrhea, constipation. All other review of systems negative    Objective:   Physical Exam Blood pressure 106/64, pulse 85, temperature 98.5 F (36.9 C), temperature source Oral, height 5' (1.524 m), weight 140 lb 9.6 oz (63.776 kg), SpO2 98 %. Gen: No apparent distress Neuro: No gross focal deficits. Neck: No JVD, lymphadenopathy, thyromegaly. RS: Clear, No wheeze or crackles CVS: S1-S2 heard, no murmurs rubs gallops. Abdomen: Soft, positive bowel sounds. Extremities: No edema.     Assessment & Plan:  Moderate persistent asthma without exacerbation Flu A infection.  We will start her on Tamiflu for 5 days. She does not have any symptoms of increasing dyspnea, wheezing. She'll continue her inhalers as before. She will call us back if she has increasing respiratory symptoms.  Plan: - Start tamiflu - continue qvar and albuterol rescue inhaler.  Chilton GreathousePraveen Danel Studzinski MD [facility] Pager [phone] If no answer or after 3pm call: [phone] 01/21/2016, 11:41 AM

## 2016-01-21 NOTE — Patient Instructions (Signed)
We started you on Tamiflu for 5 days.  Continue using the Qvar and albuterol inhaler. Please call our office back if you develop any symptoms of fevers, chills, increasing dyspnea, wheezing.  Return to clinic in 3 months.

## 2016-01-30 ENCOUNTER — Ambulatory Visit (INDEPENDENT_AMBULATORY_CARE_PROVIDER_SITE_OTHER): Payer: Medicare HMO | Admitting: Nurse Practitioner

## 2016-01-30 ENCOUNTER — Encounter: Payer: Self-pay | Admitting: Nurse Practitioner

## 2016-01-30 VITALS — BP 141/86 | HR 87 | Temp 97.0°F | Ht 60.0 in | Wt 140.0 lb

## 2016-01-30 DIAGNOSIS — S01111A Laceration without foreign body of right eyelid and periocular area, initial encounter: Secondary | ICD-10-CM | POA: Diagnosis not present

## 2016-01-30 DIAGNOSIS — W101XXS Fall (on)(from) sidewalk curb, sequela: Secondary | ICD-10-CM | POA: Diagnosis not present

## 2016-01-30 NOTE — Patient Instructions (Signed)
Fall Prevention in the Home  Falls can cause injuries and can affect people from all age groups. There are many simple things that you can do to make your home safe and to help prevent falls. WHAT CAN I DO ON THE OUTSIDE OF MY HOME?  Regularly repair the edges of walkways and driveways and fix any cracks.  Remove high doorway thresholds.  Trim any shrubbery on the main path into your home.  Use bright outdoor lighting.  Clear walkways of debris and clutter, including tools and rocks.  Regularly check that handrails are securely fastened and in good repair. Both sides of any steps should have handrails.  Install guardrails along the edges of any raised decks or porches.  Have leaves, snow, and ice cleared regularly.  Use sand or salt on walkways during winter months.  In the garage, clean up any spills right away, including grease or oil spills. WHAT CAN I DO IN THE BATHROOM?  Use night lights.  Install grab bars by the toilet and in the tub and shower. Do not use towel bars as grab bars.  Use non-skid mats or decals on the floor of the tub or shower.  If you need to sit down while you are in the shower, use a plastic, non-slip stool..  Keep the floor dry. Immediately clean up any water that spills on the floor.  Remove soap buildup in the tub or shower on a regular basis.  Attach bath mats securely with double-sided non-slip rug tape.  Remove throw rugs and other tripping hazards from the floor. WHAT CAN I DO IN THE BEDROOM?  Use night lights.  Make sure that a bedside light is easy to reach.  Do not use oversized bedding that drapes onto the floor.  Have a firm chair that has side arms to use for getting dressed.  Remove throw rugs and other tripping hazards from the floor. WHAT CAN I DO IN THE KITCHEN?   Clean up any spills right away.  Avoid walking on wet floors.  Place frequently used items in easy-to-reach places.  If you need to reach for something  above you, use a sturdy step stool that has a grab bar.  Keep electrical cables out of the way.  Do not use floor polish or wax that makes floors slippery. If you have to use wax, make sure that it is non-skid floor wax.  Remove throw rugs and other tripping hazards from the floor. WHAT CAN I DO IN THE STAIRWAYS?  Do not leave any items on the stairs.  Make sure that there are handrails on both sides of the stairs. Fix handrails that are broken or loose. Make sure that handrails are as long as the stairways.  Check any carpeting to make sure that it is firmly attached to the stairs. Fix any carpet that is loose or worn.  Avoid having throw rugs at the top or bottom of stairways, or secure the rugs with carpet tape to prevent them from moving.  Make sure that you have a light switch at the top of the stairs and the bottom of the stairs. If you do not have them, have them installed. WHAT ARE SOME OTHER FALL PREVENTION TIPS?  Wear closed-toe shoes that fit well and support your feet. Wear shoes that have rubber soles or low heels.  When you use a stepladder, make sure that it is completely opened and that the sides are firmly locked. Have someone hold the ladder while you   are using it. Do not climb a closed stepladder.  Add color or contrast paint or tape to grab bars and handrails in your home. Place contrasting color strips on the first and last steps.  Use mobility aids as needed, such as canes, walkers, scooters, and crutches.  Turn on lights if it is dark. Replace any light bulbs that burn out.  Set up furniture so that there are clear paths. Keep the furniture in the same spot.  Fix any uneven floor surfaces.  Choose a carpet design that does not hide the edge of steps of a stairway.  Be aware of any and all pets.  Review your medicines with your healthcare provider. Some medicines can cause dizziness or changes in blood pressure, which increase your risk of falling. Talk  with your health care provider about other ways that you can decrease your risk of falls. This may include working with a physical therapist or trainer to improve your strength, balance, and endurance.   This information is not intended to replace advice given to you by your health care provider. Make sure you discuss any questions you have with your health care provider.   Document Released: 10/02/2002 Document Revised: 02/26/2015 Document Reviewed: 11/16/2014 Elsevier Interactive Patient Education 2016 [employer]  

## 2016-01-30 NOTE — Progress Notes (Signed)
   Subjective:    Patient ID: [name], female    DOB: 28-Dec-1940, 75 y.o.   MRN: [id number]  HPI Pt in because of a fall. She was crossing the street and fell on the concrete on her right side. Laceration on right side of head at eyebrow and on right knee. Pain in the right wrist. Has steri-strips on right side of head that hairdresser put on there. Pt denies any blurred vision or headache. She rates her pain 5/10 right now worst being the wrist and thumb where she tried to break the fall.      Review of Systems  Eyes:       Negative visual changes  Musculoskeletal:       Pain in right wrist   Skin:       Laceration to the right of eyebrow. Scraped skin in right knee  Neurological:       Denies feeling dizzy or lightheaded.        Objective:   Physical Exam  Constitutional: She is oriented to person, place, and time. She appears well-developed and well-nourished.  HENT:  Laceration at right eyebrow less than 1 cm.   Eyes: EOM are normal.    Musculoskeletal:  FROM of right wrist without pain- able to fully supinate and pronate without discomfort. Opposition of thumb to all fingers without pain  Neurological: She is alert and oriented to person, place, and time. She has normal reflexes. No cranial nerve deficit.  Skin: Skin is warm.  Psychiatric: She has a normal mood and affect. Her behavior is normal. Judgment and thought content normal.   BP 141/86 mmHg  Pulse 87  Temp(Src) 97 F (36.1 C) (Oral)  Ht 5' (1.524 m)  Wt 140 lb (63.504 kg)  BMI 27.34 kg/m2        Assessment & Plan:  1. Fall (on)(from) sidewalk curb, sequela Fall precautions discussed  2. Laceration of eyebrow, right, initial encounter Wound cleaned- no need to do anything- antibiotic ointment applied Watch for signs of infection Do not pick or scratch at area  Bennie PieriniMary-Margaret Luis Nickles, FNP

## 2016-02-05 ENCOUNTER — Encounter: Payer: Self-pay | Admitting: *Deleted

## 2016-03-05 ENCOUNTER — Other Ambulatory Visit: Payer: Self-pay | Admitting: Critical Care Medicine

## 2016-03-24 ENCOUNTER — Ambulatory Visit: Payer: Medicare HMO | Attending: Sports Medicine | Admitting: Physical Therapy

## 2016-03-24 DIAGNOSIS — M25511 Pain in right shoulder: Secondary | ICD-10-CM | POA: Diagnosis not present

## 2016-03-24 NOTE — Therapy (Signed)
[facility] [address] [place], [place], [address] Phone: [phone]   Fax:  [phone]  Physical Therapy Evaluation  Patient Details  Name: [name] MRN: [id number] Date of Birth: Jan 25, 1941 Referring Provider: Delton SeeKenneth Barnes MD  Encounter Date: 03/24/2016      PT End of Session - 03/24/16 1732    Visit Number 1   Number of Visits 3   Date for PT Re-Evaluation 04/14/16   PT Start Time 0900   PT Stop Time 0925   PT Time Calculation (min) 25 min   Activity Tolerance Patient tolerated treatment well   Behavior [facility] for tasks assessed/performed      Past Medical History  Diagnosis Date  . Postinflammatory pulmonary fibrosis (HCC)   . Hypothyroidism   . Asthma   . Gallstone pancreatitis   . Vertigo   . Osteoporosis     Past Surgical History  Procedure Laterality Date  . Cholecystectomy  2010  . Tubal ligation    . Cataract extraction w/phaco  10/01/2011    Procedure: CATARACT EXTRACTION PHACO AND INTRAOCULAR LENS PLACEMENT (IOC);  Surgeon: Gemma PayorKerry Hunt;  Location: [facility];  Service: Ophthalmology;  Laterality: Left;  CDE:13.74  . Cataract extraction w/phaco  10/22/2011    Procedure: CATARACT EXTRACTION PHACO AND INTRAOCULAR LENS PLACEMENT (IOC);  Surgeon: Gemma PayorKerry Hunt;  Location: [facility];  Service: Ophthalmology;  Laterality: Right;  CDE:9.98  . Abdominal hysterectomy      There were no vitals filed for this visit.       Subjective Assessment - 03/24/16 1734    Subjective I got an injection in my shoulder and I have no pain.   Patient Stated Goals Get a home program.   Currently in Pain? No/denies            [facility] - 03/24/16 0001    Assessment   Medical Diagnosis Rotator cuff impingement.   Referring Provider Delton SeeKenneth Barnes MD   Onset Date/Surgical Date --  One month.   Precautions   Precautions None   Restrictions   Weight Bearing Restrictions No   Balance Screen   Has  the patient fallen in the past 6 months Yes   How many times? 2   Has the patient had a decrease in activity level because of a fear of falling?  Yes   Is the patient reluctant to leave their home because of a fear of falling?  No   Home Environment   Living Environment Private residence   Prior Function   Level of Independence Independent   Posture/Postural Control   Posture/Postural Control Postural limitations   Postural Limitations Rounded Shoulders;Forward head   ROM / Strength   AROM / PROM / Strength AROM;Strength   AROM   Overall AROM Comments Full active right knee range of motion.  Active cervical range of motion is [facility].   Strength   Overall Strength Comments Right shoulder ER= 4+/5.   Palpation   Palpation comment --  No palpable pain.   Special Tests    Special Tests --  Normal UE DTR's.                                PT Long Term Goals - 03/24/16 1743    PT LONG TERM GOAL #1   Title Ind with a HEP.   Time 3   Period Weeks   Status New  Plan - 04-06-16 1735    Clinical Impression Statement The patient reports she began to experience right shoulder pain approximately one month ago.  She received an injection recently and states it was highly effective.  She reports no pain today.  She would like to return to one visit to establish a HEP and then plans to discharge after that.   Rehab Potential Excellent   PT Duration --  2-3 visits.   PT Treatment/Interventions Therapeutic exercise   PT Next Visit Plan Right RW 4; SDLY ER; Full can; scapular strengthening   Consulted and Agree with Plan of Care Patient      Patient will benefit from skilled therapeutic intervention in order to improve the following deficits and impairments:  Decreased activity tolerance  Visit Diagnosis: Pain in right shoulder - Plan: PT plan of care cert/re-cert      G-Codes - Apr 06, 2016 1744    Functional Assessment Tool Used Clinical  judgement.   Functional Limitation Other PT primary   Other PT Primary Current Status (Z6109) At least 1 percent but less than 20 percent impaired, limited or restricted   Other PT Primary Goal Status (U0454) At least 1 percent but less than 20 percent impaired, limited or restricted       Problem List Patient Active Problem List   Diagnosis Date Noted  . Osteoporosis 12/02/2015  . Vitamin D deficiency 11/21/2015  . HLD (hyperlipidemia) 03/28/2013  . Allergic rhinitis 01/08/2010  . Hypothyroidism 07/03/2007  . Moderate persistent asthma in adult without complication 07/03/2007  . FIBROSIS, POSTINFLAMMATORY PULMONARY 07/03/2007    [name], [place] MPT 04/06/16, 5:56 PM  [facility] [address] [place], [place], [address] Phone: [phone]   Fax:  [phone]  Name: [name] MRN: [id number] Date of Birth: 1941-06-03

## 2016-03-31 ENCOUNTER — Ambulatory Visit (INDEPENDENT_AMBULATORY_CARE_PROVIDER_SITE_OTHER): Payer: Medicare HMO

## 2016-03-31 ENCOUNTER — Encounter: Payer: Self-pay | Admitting: Family Medicine

## 2016-03-31 ENCOUNTER — Ambulatory Visit (INDEPENDENT_AMBULATORY_CARE_PROVIDER_SITE_OTHER): Payer: Medicare HMO | Admitting: Family Medicine

## 2016-03-31 VITALS — BP 142/79 | HR 64 | Temp 97.4°F | Ht 60.0 in | Wt 139.0 lb

## 2016-03-31 DIAGNOSIS — J209 Acute bronchitis, unspecified: Secondary | ICD-10-CM

## 2016-03-31 DIAGNOSIS — J301 Allergic rhinitis due to pollen: Secondary | ICD-10-CM

## 2016-03-31 DIAGNOSIS — R05 Cough: Secondary | ICD-10-CM

## 2016-03-31 DIAGNOSIS — R059 Cough, unspecified: Secondary | ICD-10-CM

## 2016-03-31 DIAGNOSIS — J40 Bronchitis, not specified as acute or chronic: Secondary | ICD-10-CM | POA: Diagnosis not present

## 2016-03-31 DIAGNOSIS — J4540 Moderate persistent asthma, uncomplicated: Secondary | ICD-10-CM

## 2016-03-31 DIAGNOSIS — J8410 Pulmonary fibrosis, unspecified: Secondary | ICD-10-CM | POA: Diagnosis not present

## 2016-03-31 MED ORDER — METHYLPREDNISOLONE ACETATE 80 MG/ML IJ SUSP
60.0000 mg | Freq: Once | INTRAMUSCULAR | Status: AC
  Administered 2016-03-31: 60 mg via INTRAMUSCULAR

## 2016-03-31 MED ORDER — AZITHROMYCIN 250 MG PO TABS: ORAL_TABLET | ORAL | Status: DC

## 2016-03-31 MED ORDER — PREDNISONE 10 MG PO TABS: ORAL_TABLET | ORAL | Status: DC

## 2016-03-31 NOTE — Patient Instructions (Signed)
Drink plenty of fluids and stay well hydrated Take Tylenol as needed for aches pains and fever Use inhalers regularly and pro-air if needed. Take antibiotic as directed Take prednisone as directed Return to the office in 10-14 days for recheck or sooner if worse We will call with results of the CBC and a chest x-ray as soon as they become available Take Mucinex as directed one twice daily with a large glass of water as this will help loosen cough and congestion

## 2016-03-31 NOTE — Progress Notes (Signed)
Subjective:    Patient ID: [name], female    DOB: 1941/05/14, 75 y.o.   MRN: [id number]  HPI Patient here today for cough and congestion that started about 1 week ago. The patient is coughing up yellow sputum. This is been going on for about a week. She will get a chest x-ray and we will make sure we get a CBC. She has a history of asthma and pulmonary fibrosis. She sees the pulmonologist every 6 months on a regular basis. She has her regular inhaler that she uses nightly and uses the pro-air only if needed. She has not used the pro-air so far the past 2 weeks. She has not been running a fever and she has not had a sore throat or any increased sinus congestion.     Patient Active Problem List   Diagnosis Date Noted  . Osteoporosis 12/02/2015  . Vitamin D deficiency 11/21/2015  . HLD (hyperlipidemia) 03/28/2013  . Allergic rhinitis 01/08/2010  . Hypothyroidism 07/03/2007  . Moderate persistent asthma in adult without complication 07/03/2007  . FIBROSIS, POSTINFLAMMATORY PULMONARY 07/03/2007   Outpatient Encounter Prescriptions as of 03/31/2016  Medication Sig  . aspirin EC 81 MG tablet Take 81 mg by mouth daily.    . beclomethasone (QVAR) 80 MCG/ACT inhaler Inhale 2 puffs into the lungs 2 (two) times daily.  . Cholecalciferol (VITAMIN D-3) 1000 UNITS CAPS Take by mouth daily.   Marland Kitchen. levothyroxine (SYNTHROID, LEVOTHROID) 88 MCG tablet Take 1 tablet (88 mcg total) by mouth daily.  . meclizine (ANTIVERT) 25 MG tablet Take 1 tablet (25 mg total) by mouth 3 (three) times daily as needed for dizziness.  . Multiple Vitamins-Minerals (CENTRUM SILVER PO) Take 1 tablet by mouth daily. Reported on 12/12/2015  . PROAIR HFA 108 (90 Base) MCG/ACT inhaler INHALE TWO PUFFS BY MOUTH EVERY 6 HOURS AS NEEDED FOR SHORTNESS OF BREATH   No facility-administered encounter medications on file as of 03/31/2016.      Review of Systems  Constitutional: Negative.   HENT: Positive for congestion (yellow).     Eyes: Negative.   Respiratory: Positive for cough.   Cardiovascular: Negative.   Gastrointestinal: Negative.   Endocrine: Negative.   Genitourinary: Negative.   Musculoskeletal: Negative.   Skin: Negative.   Allergic/Immunologic: Negative.   Neurological: Negative.   Hematological: Negative.   Psychiatric/Behavioral: Negative.        Objective:   Physical Exam  Constitutional: She is oriented to person, place, and time. She appears well-developed and well-nourished. No distress.  Pleasant and alert  HENT:  Head: Normocephalic and atraumatic.  Right Ear: External ear normal.  Left Ear: External ear normal.  Nose: Nose normal.  Mouth/Throat: Oropharynx is clear and moist. No oropharyngeal exudate.  Eyes: Conjunctivae and EOM are normal. Pupils are equal, round, and reactive to light. Right eye exhibits no discharge. Left eye exhibits no discharge. No scleral icterus.  Neck: Normal range of motion. Neck supple. No thyromegaly present.  No anterior cervical adenopathy  Cardiovascular: Normal rate, regular rhythm and normal heart sounds.   No murmur heard. Pulmonary/Chest: Effort normal. No respiratory distress. She has wheezes. She has no rales. She exhibits no tenderness.  There is congestion with coughing and a few rhonchi with taking a deep breath. There are scattered wheezes both on inspiration and expiration. These are sparse in nature.  Musculoskeletal: Normal range of motion. She exhibits no edema.  Lymphadenopathy:    She has no cervical adenopathy.  Neurological: She is  alert and oriented to person, place, and time.  Skin: Skin is warm and dry. No rash noted.  Psychiatric: She has a normal mood and affect. Her behavior is normal. Judgment and thought content normal.  Nursing note and vitals reviewed.  BP 142/79 mmHg  Pulse 64  Temp(Src) 97.4 F (36.3 C) (Oral)  Ht 5' (1.524 m)  Wt 139 lb (63.05 kg)  BMI 27.15 kg/m2        Assessment & Plan:  1. Cough -Take  Mucinex for cough and congestion 1 twice daily with a large glass of water - CBC with Differential/Platelet - DG Chest 2 View; Future - methylPREDNISolone acetate (DEPO-MEDROL) injection 60 mg; Inject 0.75 mLs (60 mg total) into the muscle once.  2. Moderate persistent asthma in adult without complication -Continue inhalers as doing and use pro-air if needed for increased wheezing - methylPREDNISolone acetate (DEPO-MEDROL) injection 60 mg; Inject 0.75 mLs (60 mg total) into the muscle once.  3. FIBROSIS, POSTINFLAMMATORY PULMONARY -Continue follow-up with pulmonology  4. Allergic rhinitis due to pollen -Use nasal saline  5. Bronchitis with bronchospasm -Take antibiotic as directed and use Mucinex, nasal saline and inhalers as has been. -Take prednisone also as directed. -Return to clinic in 10-14 days for follow-up or sooner if needed  Meds ordered this encounter  Medications  . azithromycin (ZITHROMAX) 250 MG tablet    Sig: As directed    Dispense:  6 tablet    Refill:  0  . predniSONE (DELTASONE) 10 MG tablet    Sig: Take 1 tab QID x 2 days, then 1 tab TID x 2 days, then 1 tab BID x 2 days, then 1 tab QD x 2 days.    Dispense:  20 tablet    Refill:  0  . methylPREDNISolone acetate (DEPO-MEDROL) injection 60 mg    Sig:    Patient Instructions  Drink plenty of fluids and stay well hydrated Take Tylenol as needed for aches pains and fever Use inhalers regularly and pro-air if needed. Take antibiotic as directed Take prednisone as directed Return to the office in 10-14 days for recheck or sooner if worse We will call with results of the CBC and a chest x-ray as soon as they become available Take Mucinex as directed one twice daily with a large glass of water as this will help loosen cough and congestion   Nyra Capes MD

## 2016-04-01 ENCOUNTER — Ambulatory Visit: Payer: Medicare HMO | Attending: Sports Medicine | Admitting: Physical Therapy

## 2016-04-01 ENCOUNTER — Encounter: Payer: Self-pay | Admitting: Physical Therapy

## 2016-04-01 DIAGNOSIS — M25511 Pain in right shoulder: Secondary | ICD-10-CM | POA: Diagnosis not present

## 2016-04-01 NOTE — Therapy (Signed)
[facility] [place], [place], [address] Phone: [phone]   Fax:  [phone]  Physical Therapy Treatment  Patient Details  Name: [name] MRN: [id number] Date of Birth: 1941/08/25 Referring Provider: Verda Cumins MD  Encounter Date: 04/01/2016      PT End of Session - 04/01/16 0934    Visit Number 2   Number of Visits 3   Date for PT Re-Evaluation 04/14/16   PT Start Time 0945   PT Stop Time 1023   PT Time Calculation (min) 38 min   Activity Tolerance Patient tolerated treatment well   Behavior During Therapy [facility] for tasks assessed/performed      Past Medical History  Diagnosis Date  . Postinflammatory pulmonary fibrosis ([place])   . Hypothyroidism   . Asthma   . Gallstone pancreatitis   . Vertigo   . Osteoporosis     Past Surgical History  Procedure Laterality Date  . Cholecystectomy  2010  . Tubal ligation    . Cataract extraction w/phaco  10/01/2011    Procedure: CATARACT EXTRACTION PHACO AND INTRAOCULAR LENS PLACEMENT (IOC);  Surgeon: Tonny Branch;  Location: [facility];  Service: Ophthalmology;  Laterality: Left;  CDE:13.74  . Cataract extraction w/phaco  10/22/2011    Procedure: CATARACT EXTRACTION PHACO AND INTRAOCULAR LENS PLACEMENT (IOC);  Surgeon: Tonny Branch;  Location: [facility];  Service: Ophthalmology;  Laterality: Right;  CDE:9.98  . Abdominal hysterectomy      There were no vitals filed for this visit.      Subjective Assessment - 04/01/16 0934    Subjective Reports that her shoulder is doing good.   Patient Stated Goals Get a home program.   Currently in Pain? No/denies            [facility] PT Assessment - 04/01/16 0001    Assessment   Medical Diagnosis Rotator cuff impingement.   Precautions   Precautions None   Restrictions   Weight Bearing Restrictions No                     [facility] Adult PT Treatment/Exercise - 04/01/16 0001    Exercises   Exercises Shoulder   Shoulder Exercises: Standing   Horizontal ABduction Strengthening;Both;Theraband  3x10 reps   Theraband Level (Shoulder Horizontal ABduction) Level 1 (Yellow)   External Rotation Strengthening;Right;Both;Theraband  x30 reps for both exercises   Theraband Level (Shoulder External Rotation) Level 1 (Yellow)   Flexion Strengthening;Both;Weights  3x10 reps   Shoulder Flexion Weight (lbs) 2   ABduction Strengthening;Both;Weights  3x10 reps   Shoulder ABduction Weight (lbs) 2   Extension Strengthening;Both;Theraband  3x10 reps   Theraband Level (Shoulder Extension) Level 1 (Yellow)   Row Strengthening;Both;Theraband  x30 reps   Theraband Level (Shoulder Row) Level 1 (Yellow)   Other Standing Exercises R shoulder D2 yellow theraband x30 reps   Other Standing Exercises B shoulder scaption 2# x30 reps   Shoulder Exercises: ROM/Strengthening   UBE (Upper Arm Bike) 120 RPM x5 min for warm-up   "W" Arms B shoulders 2# x30 reps   X to V Arms x30 reps 2#                PT Education - 04/01/16 1026    Education provided Yes   Education Details HEP- row, ext, horizontal abduction, D2 PNF strengthening with yellow theraband   Person(s) Educated Patient   Methods Explanation;Demonstration;Tactile cues;Verbal cues;Handout   Comprehension Verbalized understanding;Returned demonstration;Verbal cues required;Tactile cues required  PT Long Term Goals - 04/01/16 1027    PT LONG TERM GOAL #1   Title Ind with a HEP.   Time 3   Period Weeks   Status Achieved               Plan - 04/01/16 1024    Clinical Impression Statement Patient tolerated today's treatment well without reports of shoulder pain with any of the exercises. Patient required maximum multimodal cueing for exercise technique and repeated corrections today in clinic. Patient required maximum multimodal cueing regarding education for HEP today and was educated that if she encountered any discomfort with  HEP to not complete as many sets of exercises and if pain persisted to contact MD. Patient denied shoulder pain following today's treatment.   Rehab Potential Excellent   PT Treatment/Interventions Therapeutic exercise   PT Next Visit Plan D/C summary required secondary to D/C to HEP.   Consulted and Agree with Plan of Care Patient      Patient will benefit from skilled therapeutic intervention in order to improve the following deficits and impairments:  Decreased activity tolerance  Visit Diagnosis: Pain in right shoulder     Problem List Patient Active Problem List   Diagnosis Date Noted  . Osteoporosis 12/02/2015  . Vitamin D deficiency 11/21/2015  . HLD (hyperlipidemia) 03/28/2013  . Allergic rhinitis 01/08/2010  . Hypothyroidism 07/03/2007  . Moderate persistent asthma in adult without complication 07/03/2007  . FIBROSIS, POSTINFLAMMATORY PULMONARY 07/03/2007   Florence CannerKelsey Parsons, PTA 04/01/2016 10:28 AM  [facility] [address] [place], [place], [address] Phone: [phone]   Fax:  [phone]  Name: [name] MRN: [id number] Date of Birth: 28-Jul-1941

## 2016-04-01 NOTE — Patient Instructions (Signed)
Scapular Retraction: Bilateral    Facing anchor, pull arms back, bringing shoulder blades together. Repeat __10__ times per set. Do _3___ sets per session. Do _2-3___ sessions per day.  http://orth.exer.us/176   Copyright  VHI. All rights reserved.  Strengthening: Resisted Extension    Hold tubing in both hand, arm forward. Pull arm back, elbow straight. Repeat __10__ times per set. Do __3__ sets per session. Do _2-3___ sessions per day.  http://orth.exer.us/832   Copyright  VHI. All rights reserved.  PNF Strengthening: Resisted    Standing with resistive band around each hand, bring right arm up and away, thumb back. Repeat _10___ times per set. Do _3___ sets per session. Do _2-3___ sessions per day.  http://orth.exer.us/918   Copyright  VHI. All rights reserved.  Resisted Horizontal Abduction: Bilateral    Sit or stand, tubing in both hands, arms out in front. Keeping arms straight, pinch shoulder blades together and stretch arms out. Repeat _10___ times per set. Do _3___ sets per session. Do __2-3__ sessions per day.  http://orth.exer.us/968   Copyright  VHI. All rights reserved.

## 2016-04-01 NOTE — Therapy (Signed)
[facility] [place], [place], [address] Phone: [phone]   Fax:  [phone]  Physical Therapy Treatment  Patient Details  Name: [name] MRN: [id number] Date of Birth: 1941/08/25 Referring Provider: Verda Cumins MD  Encounter Date: 04/01/2016      PT End of Session - 04/01/16 0934    Visit Number 2   Number of Visits 3   Date for PT Re-Evaluation 04/14/16   PT Start Time 0945   PT Stop Time 1023   PT Time Calculation (min) 38 min   Activity Tolerance Patient tolerated treatment well   Behavior During Therapy [facility] for tasks assessed/performed      Past Medical History  Diagnosis Date  . Postinflammatory pulmonary fibrosis ([place])   . Hypothyroidism   . Asthma   . Gallstone pancreatitis   . Vertigo   . Osteoporosis     Past Surgical History  Procedure Laterality Date  . Cholecystectomy  2010  . Tubal ligation    . Cataract extraction w/phaco  10/01/2011    Procedure: CATARACT EXTRACTION PHACO AND INTRAOCULAR LENS PLACEMENT (IOC);  Surgeon: Tonny Branch;  Location: [facility];  Service: Ophthalmology;  Laterality: Left;  CDE:13.74  . Cataract extraction w/phaco  10/22/2011    Procedure: CATARACT EXTRACTION PHACO AND INTRAOCULAR LENS PLACEMENT (IOC);  Surgeon: Tonny Branch;  Location: [facility];  Service: Ophthalmology;  Laterality: Right;  CDE:9.98  . Abdominal hysterectomy      There were no vitals filed for this visit.      Subjective Assessment - 04/01/16 0934    Subjective Reports that her shoulder is doing good.   Patient Stated Goals Get a home program.   Currently in Pain? No/denies            [facility] PT Assessment - 04/01/16 0001    Assessment   Medical Diagnosis Rotator cuff impingement.   Precautions   Precautions None   Restrictions   Weight Bearing Restrictions No                     [facility] Adult PT Treatment/Exercise - 04/01/16 0001    Exercises   Exercises Shoulder   Shoulder Exercises: Standing   Horizontal ABduction Strengthening;Both;Theraband  3x10 reps   Theraband Level (Shoulder Horizontal ABduction) Level 1 (Yellow)   External Rotation Strengthening;Right;Both;Theraband  x30 reps for both exercises   Theraband Level (Shoulder External Rotation) Level 1 (Yellow)   Flexion Strengthening;Both;Weights  3x10 reps   Shoulder Flexion Weight (lbs) 2   ABduction Strengthening;Both;Weights  3x10 reps   Shoulder ABduction Weight (lbs) 2   Extension Strengthening;Both;Theraband  3x10 reps   Theraband Level (Shoulder Extension) Level 1 (Yellow)   Row Strengthening;Both;Theraband  x30 reps   Theraband Level (Shoulder Row) Level 1 (Yellow)   Other Standing Exercises R shoulder D2 yellow theraband x30 reps   Other Standing Exercises B shoulder scaption 2# x30 reps   Shoulder Exercises: ROM/Strengthening   UBE (Upper Arm Bike) 120 RPM x5 min for warm-up   "W" Arms B shoulders 2# x30 reps   X to V Arms x30 reps 2#                PT Education - 04/01/16 1026    Education provided Yes   Education Details HEP- row, ext, horizontal abduction, D2 PNF strengthening with yellow theraband   Person(s) Educated Patient   Methods Explanation;Demonstration;Tactile cues;Verbal cues;Handout   Comprehension Verbalized understanding;Returned demonstration;Verbal cues required;Tactile cues required  PT Long Term Goals - 04/01/16 1027    PT LONG TERM GOAL #1   Title Ind with a HEP.   Time 3   Period Weeks   Status Achieved               Plan - 04/01/16 1024    Clinical Impression Statement Patient tolerated today's treatment well without reports of shoulder pain with any of the exercises. Patient required maximum multimodal cueing for exercise technique and repeated corrections today in clinic. Patient required maximum multimodal cueing regarding education for HEP today and was educated that if she encountered any discomfort with  HEP to not complete as many sets of exercises and if pain persisted to contact MD. Patient denied shoulder pain following today's treatment.   Rehab Potential Excellent   PT Treatment/Interventions Therapeutic exercise   PT Next Visit Plan D/C summary required secondary to D/C to HEP.   Consulted and Agree with Plan of Care Patient      Patient will benefit from skilled therapeutic intervention in order to improve the following deficits and impairments:  Decreased activity tolerance  Visit Diagnosis: Pain in right shoulder     Problem List Patient Active Problem List   Diagnosis Date Noted  . Osteoporosis 12/02/2015  . Vitamin D deficiency 11/21/2015  . HLD (hyperlipidemia) 03/28/2013  . Allergic rhinitis 01/08/2010  . Hypothyroidism 07/03/2007  . Moderate persistent asthma in adult without complication 47/39/5844  . FIBROSIS, POSTINFLAMMATORY PULMONARY 07/03/2007   PHYSICAL THERAPY DISCHARGE SUMMARY  Visits from Start of Care: 2  Current functional level related to goals / functional outcomes: Please see above.   Remaining deficits: None reported.   Education / Equipment: HEP.  Plan: Patient agrees to discharge.  Patient goals were met. Patient is being discharged due to meeting the stated rehab goals.  ?????      Aliyah Abeyta, [place] MPT 04/01/2016, 3:16 PM  [facility] [address] [place], [place], [address] Phone: [phone]   Fax:  [phone]  Name: [name] MRN: [id number] Date of Birth: 12/23/1940

## 2016-04-10 ENCOUNTER — Encounter: Payer: Self-pay | Admitting: Family Medicine

## 2016-04-10 ENCOUNTER — Ambulatory Visit (INDEPENDENT_AMBULATORY_CARE_PROVIDER_SITE_OTHER): Payer: Medicare HMO | Admitting: Family Medicine

## 2016-04-10 VITALS — BP 116/71 | HR 72 | Temp 97.4°F | Ht 60.0 in | Wt 136.0 lb

## 2016-04-10 DIAGNOSIS — J209 Acute bronchitis, unspecified: Secondary | ICD-10-CM | POA: Insufficient documentation

## 2016-04-10 DIAGNOSIS — J40 Bronchitis, not specified as acute or chronic: Secondary | ICD-10-CM

## 2016-04-10 DIAGNOSIS — J8410 Pulmonary fibrosis, unspecified: Secondary | ICD-10-CM | POA: Diagnosis not present

## 2016-04-10 NOTE — Patient Instructions (Signed)
Continue to avoid irritating environments Continue to drink plenty of fluids and stay well hydrated Continue with the Mucinex one twice daily with a large glass of water for cough and congestion twice daily for the next 4 weeks Continue to use inhalers Follow-up with pulmonology as planned

## 2016-04-10 NOTE — Progress Notes (Signed)
Subjective:    Patient ID: [name], female    DOB: 1940-11-03, 75 y.o.   MRN: [id number]  HPI Patient here today for 10 day follow up on cough. She has finished all medications and she is feeling much better. The patient looks better and feels better. She has finished all her medicines. She continues to use Qvar at night and the pro-air as needed.     Patient Active Problem List   Diagnosis Date Noted  . Osteoporosis 12/02/2015  . Vitamin D deficiency 11/21/2015  . HLD (hyperlipidemia) 03/28/2013  . Allergic rhinitis 01/08/2010  . Hypothyroidism 07/03/2007  . Moderate persistent asthma in adult without complication 07/03/2007  . 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 aspirin EC 81 MG tablet Take 81 mg by mouth daily.    . beclomethasone (QVAR) 80 MCG/ACT inhaler Inhale 2 puffs into the lungs 2 (two) times daily.  . Cholecalciferol (VITAMIN D-3) 1000 UNITS CAPS Take by mouth daily.   Marland Kitchen levothyroxine (SYNTHROID, LEVOTHROID) 88 MCG tablet Take 1 tablet (88 mcg total) by mouth daily.  . meclizine (ANTIVERT) 25 MG tablet Take 1 tablet (25 mg total) by mouth 3 (three) times daily as needed for dizziness.  . Multiple Vitamins-Minerals (CENTRUM SILVER PO) Take 1 tablet by mouth daily. Reported on 12/12/2015  . PROAIR HFA 108 (90 Base) MCG/ACT inhaler INHALE TWO PUFFS BY MOUTH EVERY 6 HOURS AS NEEDED FOR SHORTNESS OF BREATH  . [DISCONTINUED] azithromycin (ZITHROMAX) 250 MG tablet As directed  . [DISCONTINUED] predniSONE (DELTASONE) 10 MG tablet Take 1 tab QID x 2 days, then 1 tab TID x 2 days, then 1 tab BID x 2 days, then 1 tab QD x 2 days.   No facility-administered encounter medications on file as of 04/10/2016.     Review of Systems  Constitutional: Negative.   HENT: Negative.   Eyes: Negative.   Respiratory: Negative.   Cardiovascular: Negative.   Gastrointestinal: Negative.   Endocrine:  Negative.   Genitourinary: Negative.   Musculoskeletal: Negative.   Skin: Negative.   Allergic/Immunologic: Negative.   Neurological: Negative.   Hematological: Negative.   Psychiatric/Behavioral: Negative.        Objective:   Physical Exam  Constitutional: She is oriented to person, place, and time. She appears well-developed and well-nourished. No distress.  HENT:  Head: Normocephalic and atraumatic.  Right Ear: External ear normal.  Left Ear: External ear normal.  Nose: Nose normal.  Mouth/Throat: Oropharynx is clear and moist. No oropharyngeal exudate.  Cerumen right ear canal  Eyes: Conjunctivae and EOM are normal. Pupils are equal, round, and reactive to light. Right eye exhibits no discharge. Left eye exhibits no discharge. No scleral icterus.  Neck: Neck supple.  Cardiovascular: Normal rate, regular rhythm and normal heart sounds.   No murmur heard. Pulmonary/Chest: Effort normal and breath sounds normal. No respiratory distress. She has no wheezes. She has no rales.  Musculoskeletal: Normal range of motion. She exhibits no edema.  Neurological: She is alert and oriented to person, place, and time.  Skin: Skin is warm and dry. No rash noted.  Psychiatric: She has a normal mood and affect. Her behavior is normal. Thought content normal.  Nursing note and vitals reviewed.  BP 116/71 mmHg  Pulse 72  Temp(Src) 97.4 F (36.3 C) (Oral)  Ht 5' (1.524 m)  Wt 136 lb (61.689 kg)  BMI 26.56 kg/m2  Assessment & Plan:  1. FIBROSIS, POSTINFLAMMATORY PULMONARY -Continue to follow-up with pulmonology  2. Bronchitis with bronchospasm -Continue to drink plenty of fluids and stay well hydrated -Take Mucinex twice daily with a large glass of water for at least 3-4 more weeks. -Avoid irritating environments in crowds of people  Patient Instructions  Continue to avoid irritating environments Continue to drink plenty of fluids and stay well hydrated Continue with the  Mucinex one twice daily with a large glass of water for cough and congestion twice daily for the next 4 weeks Continue to use inhalers Follow-up with pulmonology as planned   Nyra Capeson W. Keyshla Tunison MD

## 2016-05-11 ENCOUNTER — Encounter: Payer: Self-pay | Admitting: Pulmonary Disease

## 2016-05-11 ENCOUNTER — Ambulatory Visit (INDEPENDENT_AMBULATORY_CARE_PROVIDER_SITE_OTHER): Payer: Medicare HMO | Admitting: Pulmonary Disease

## 2016-05-11 VITALS — BP 118/74 | HR 63 | Ht 60.0 in | Wt 138.8 lb

## 2016-05-11 DIAGNOSIS — J4540 Moderate persistent asthma, uncomplicated: Secondary | ICD-10-CM | POA: Diagnosis not present

## 2016-05-11 NOTE — Patient Instructions (Signed)
Continue using the qvar as prescribed. We will order some refills. Return to clinic in 6 months

## 2016-05-11 NOTE — Progress Notes (Signed)
   Subjective:    Patient ID: [name], female    DOB: Dec 12, 1940, 75 y.o.   MRN: [id number]  HPI Follow-up for management of asthma.  [name] is a [age] year old with moderate persistent asthma. She is a former patient of Dr. Delford FieldWright. Her symptoms are well controlled on steroid inhaler. She does not need to use her albuterol rescue inhaler.  She was treated for flu A infection with tamiflu in march 2017. She had a minor exacerbation with cough one month ago which was treated with a short pred taper. She well well and back to baseline at present. Denies any dyspnea, wheezing, mucus production, hemoptysis.  Social History: She is a never smoker, no alcohol, drug use.  Family History: Sister-asthma, breast cancer. Brother-lung cancer. Mother-emphysema, heart failure.  Past Medical History  Diagnosis Date  . Postinflammatory pulmonary fibrosis (HCC)   . Hypothyroidism   . Asthma   . Gallstone pancreatitis   . Vertigo   . Osteoporosis     Current outpatient prescriptions:  .  aspirin EC 81 MG tablet, Take 81 mg by mouth daily.  , Disp: , Rfl:  .  beclomethasone (QVAR) 80 MCG/ACT inhaler, Inhale 2 puffs into the lungs 2 (two) times daily., Disp: 8.7 g, Rfl: 5 .  Cholecalciferol (VITAMIN D-3) 1000 UNITS CAPS, Take by mouth daily. , Disp: , Rfl:  .  levothyroxine (SYNTHROID, LEVOTHROID) 88 MCG tablet, Take 1 tablet (88 mcg total) by mouth daily., Disp: 90 tablet, Rfl: 2 .  meclizine (ANTIVERT) 25 MG tablet, Take 1 tablet (25 mg total) by mouth 3 (three) times daily as needed for dizziness., Disp: 90 tablet, Rfl: 3 .  Multiple Vitamins-Minerals (CENTRUM SILVER PO), Take 1 tablet by mouth daily. Reported on 12/12/2015, Disp: , Rfl:  .  PROAIR HFA 108 (90 Base) MCG/ACT inhaler, INHALE TWO PUFFS BY MOUTH EVERY 6 HOURS AS NEEDED FOR SHORTNESS OF BREATH, Disp: 9 each, Rfl: 1  Review of Systems No dyspnea, wheezing, sputum, fevers, chills, body aches. No chest pain, palpitation. No  nausea, vomiting, diarrhea, constipation. All other review of systems negative    Objective:   Physical Exam Blood pressure 118/74, pulse 63, height 5' (1.524 m), weight 138 lb 12.8 oz (62.959 kg), SpO2 98 %. Gen: No apparent distress Neuro: No gross focal deficits. Neck: No JVD, lymphadenopathy, thyromegaly. RS: Clear, No wheeze or crackles CVS: S1-S2 heard, no murmurs rubs gallops. Abdomen: Soft, positive bowel sounds. Extremities: No edema.    Assessment & Plan:  Moderate persistent asthma without exacerbation She'll continue her inhalers as before..  Plan: - Continue qvar and albuterol rescue inhaler.  Chilton GreathousePraveen Jahnaya Branscome MD [facility] Pager [phone] If no answer or after 3pm call: [phone] 05/11/2016, 11:52 AM

## 2016-05-28 ENCOUNTER — Encounter: Payer: Self-pay | Admitting: Pediatrics

## 2016-05-28 ENCOUNTER — Ambulatory Visit (INDEPENDENT_AMBULATORY_CARE_PROVIDER_SITE_OTHER): Payer: Medicare HMO | Admitting: Pediatrics

## 2016-05-28 VITALS — BP 125/86 | HR 73 | Temp 97.1°F | Ht 60.0 in | Wt 140.4 lb

## 2016-05-28 DIAGNOSIS — L089 Local infection of the skin and subcutaneous tissue, unspecified: Secondary | ICD-10-CM | POA: Diagnosis not present

## 2016-05-28 DIAGNOSIS — L309 Dermatitis, unspecified: Secondary | ICD-10-CM

## 2016-05-28 MED ORDER — TRIAMCINOLONE ACETONIDE 0.025 % EX CREA: 1.0000 "application " | TOPICAL_CREAM | Freq: Two times a day (BID) | CUTANEOUS | 0 refills | Status: DC

## 2016-05-28 MED ORDER — MUPIROCIN 2 % EX OINT: TOPICAL_OINTMENT | CUTANEOUS | 0 refills | Status: DC

## 2016-05-28 NOTE — Patient Instructions (Signed)
Use triamcinolone on small red bumps twice a day Use the mupirocin on the infected area twice a day Let me know if getting any worse or not improving

## 2016-05-28 NOTE — Progress Notes (Signed)
    Subjective:    Patient ID: [name], female    DOB: Jun 08, 1941, 75 y.o.   MRN: [id number]  CC: rash   HPI: [name] is a 75 y.o. female presenting for Dressing Change (right ankle)  About a week ago noticed small red bumps on R ankle Then developed larger crusted area nearby, had small amount of purulent drainage a few days ago Now doesn't hurt Doesn't remember injuring self No pain near site Not putting anything on it Otherwise feeling well   Relevant past medical, surgical, family and social history reviewed and updated. Interim medical history since our last visit reviewed. Allergies and medications reviewed and updated.  History  Smoking Status  . Never Smoker  Smokeless Tobacco  . Never Used    ROS: Per HPI      Objective:    BP 125/86   Pulse 73   Temp 97.1 F (36.2 C) (Oral)   Ht 5' (1.524 m)   Wt 140 lb 6.4 oz (63.7 kg)   BMI 27.42 kg/m   Wt Readings from Last 3 Encounters:  05/28/16 140 lb 6.4 oz (63.7 kg)  05/11/16 138 lb 12.8 oz (63 kg)  04/10/16 136 lb (61.7 kg)     Gen: NAD, alert, cooperative with exam, NCAT EYES: EOMI, no scleral injection or icterus CV: WWP Resp:  normal WOB Neuro: Alert and oriented, strength equal b/l UE and LE, coordination grossly normal Skin: R lateral and posterior ankle with a few scattered red papules with central white comedone arranged roughly linearly, lateral ankle with apprx 49mm slightly crusted plaque, no tenderness, no surrounding erythema.       Assessment & Plan:    [name] was seen today for rash. Possible started as bites, now with small infected area, try below cream, if no improvement in 2 weeks will biopsy plaque.  Diagnoses and all orders for this visit:  Dermatitis -     triamcinolone (KENALOG) 0.025 % cream; Apply 1 application topically 2 (two) times daily.  Skin infection -     mupirocin ointment (BACTROBAN) 2 %; Use twice a day on affected areas   Follow up  plan: Return in about 2 weeks (around 06/11/2016).  Rex Kras, MD [facility] 05/28/2016, 11:50 AM

## 2016-06-10 ENCOUNTER — Ambulatory Visit: Payer: Medicare HMO | Admitting: Pediatrics

## 2016-06-11 ENCOUNTER — Encounter: Payer: Self-pay | Admitting: Pediatrics

## 2016-06-11 ENCOUNTER — Ambulatory Visit (INDEPENDENT_AMBULATORY_CARE_PROVIDER_SITE_OTHER): Payer: Medicare HMO | Admitting: Pediatrics

## 2016-06-11 VITALS — BP 130/85 | HR 73 | Temp 97.4°F | Ht 60.0 in | Wt 141.4 lb

## 2016-06-11 DIAGNOSIS — L309 Dermatitis, unspecified: Secondary | ICD-10-CM | POA: Diagnosis not present

## 2016-06-11 NOTE — Patient Instructions (Addendum)
Eucerin or Cerave in a jar for moisturizer

## 2016-06-11 NOTE — Progress Notes (Signed)
    Subjective:    Patient ID: [name], female    DOB: 1941/01/28, 75 y.o.   MRN: [id number]  CC: Follow-up leg rash  HPI: [name] is a 75 y.o. female presenting for Follow-up  Using mupirocin and steroid cream on leg No itching, no pain Skin smoother Pt thinks improving Feeling well No other rash No new areas appeared   Relevant past medical, surgical, family and social history reviewed. Interim medical history since our last visit reviewed. Allergies and medications reviewed and updated.  History  Smoking Status  . Never Smoker  Smokeless Tobacco  . Never Used    ROS: Per HPI      Objective:    BP 130/85   Pulse 73   Temp 97.4 F (36.3 C) (Oral)   Ht 5' (1.524 m)   Wt 141 lb 6.4 oz (64.1 kg)   BMI 27.62 kg/m   Wt Readings from Last 3 Encounters:  06/11/16 141 lb 6.4 oz (64.1 kg)  05/28/16 140 lb 6.4 oz (63.7 kg)  05/11/16 138 lb 12.8 oz (63 kg)    Gen: NAD, alert, cooperative with exam, NCAT EYES: EOMI, no conjunctival injection, or no icterus Resp: normal WOB Abd: +BS, soft, NTND. no guarding or organomegaly Ext: No edema, warm Neuro: Alert and oriented Skin: R lateral lower leg with previously open ulcer now covered with skin, apprx 1 cm, slighlty purple-red in appearance. No surrounding induration. Smaller red papules now red macules, several scattered in curved line     Assessment & Plan:  [name] was seen today for follow-up dermatitis. Improving. Continue steroid cream prn. No need for biopsy now.  Diagnoses and all orders for this visit:  Dermatitis  Follow up plan: As needed  Gabrielle Krasarol Nefertari Rebman, MD [facility] 06/11/2016

## 2016-06-30 ENCOUNTER — Other Ambulatory Visit: Payer: Self-pay | Admitting: Family

## 2016-06-30 DIAGNOSIS — E039 Hypothyroidism, unspecified: Secondary | ICD-10-CM

## 2016-07-27 ENCOUNTER — Telehealth: Payer: Self-pay | Admitting: Family

## 2016-07-27 DIAGNOSIS — M810 Age-related osteoporosis without current pathological fracture: Secondary | ICD-10-CM

## 2016-07-27 DIAGNOSIS — J4540 Moderate persistent asthma, uncomplicated: Secondary | ICD-10-CM

## 2016-07-27 DIAGNOSIS — E785 Hyperlipidemia, unspecified: Secondary | ICD-10-CM

## 2016-07-27 DIAGNOSIS — E559 Vitamin D deficiency, unspecified: Secondary | ICD-10-CM

## 2016-07-27 DIAGNOSIS — E039 Hypothyroidism, unspecified: Secondary | ICD-10-CM

## 2016-07-27 NOTE — Telephone Encounter (Signed)
Labs ordered.

## 2016-07-30 ENCOUNTER — Ambulatory Visit (INDEPENDENT_AMBULATORY_CARE_PROVIDER_SITE_OTHER): Payer: Medicare HMO

## 2016-07-30 ENCOUNTER — Telehealth: Payer: Self-pay | Admitting: Pulmonary Disease

## 2016-07-30 DIAGNOSIS — Z23 Encounter for immunization: Secondary | ICD-10-CM | POA: Diagnosis not present

## 2016-07-30 NOTE — Telephone Encounter (Signed)
Pt states she has been researching her 2018 formulary, and noticed that qvar is not on there. Pt asked if Dr. Isaiah SergeMannam could get this added her her formulary before the first of the year. I have explained to pt that Dr. Isaiah SergeMannam can not do this, but what we can do is when it gets denied we can submit a PA in hopes to get this medication approved. I informed pt that we would give her samples to get her by once qvar is no long covered. Pt asked if we could go ahead and submit this PA, I explained to her that we can submit a PA until the RX is not approved. Pt voiced understanding. Nothing further needed.

## 2016-08-10 ENCOUNTER — Other Ambulatory Visit: Payer: Self-pay | Admitting: *Deleted

## 2016-08-10 ENCOUNTER — Other Ambulatory Visit (INDEPENDENT_AMBULATORY_CARE_PROVIDER_SITE_OTHER): Payer: Medicare HMO

## 2016-08-10 DIAGNOSIS — E559 Vitamin D deficiency, unspecified: Secondary | ICD-10-CM

## 2016-08-10 DIAGNOSIS — Z7982 Long term (current) use of aspirin: Secondary | ICD-10-CM

## 2016-08-10 DIAGNOSIS — E039 Hypothyroidism, unspecified: Secondary | ICD-10-CM

## 2016-08-10 DIAGNOSIS — E785 Hyperlipidemia, unspecified: Secondary | ICD-10-CM

## 2016-08-10 NOTE — Addendum Note (Signed)
Addended by: Margorie JohnJOHNSON, Mouna Yager M on: 08/10/2016 04:56 PM   Modules accepted: Orders

## 2016-08-11 LAB — CMP14+EGFR
A/G RATIO: 1.6 (ref 1.2–2.2)
ALT: 16 IU/L (ref 0–32)
AST: 17 IU/L (ref 0–40)
Albumin: 4.1 g/dL (ref 3.5–4.8)
Alkaline Phosphatase: 58 IU/L (ref 39–117)
BUN/Creatinine Ratio: 20 (ref 12–28)
BUN: 13 mg/dL (ref 8–27)
Bilirubin Total: <0.2 mg/dL (ref 0.0–1.2)
CALCIUM: 9.8 mg/dL (ref 8.7–10.3)
CO2: 26 mmol/L (ref 18–29)
Chloride: 106 mmol/L (ref 96–106)
Creatinine, Ser: 0.66 mg/dL (ref 0.57–1.00)
GFR calc Af Amer: 101 mL/min/{1.73_m2} (ref >59)
GFR, EST NON AFRICAN AMERICAN: 87 mL/min/{1.73_m2} (ref >59)
GLOBULIN, TOTAL: 2.6 g/dL (ref 1.5–4.5)
Glucose: 90 mg/dL (ref 65–99)
POTASSIUM: 4.3 mmol/L (ref 3.5–5.2)
SODIUM: 145 mmol/L — AB (ref 134–144)
Total Protein: 6.7 g/dL (ref 6.0–8.5)

## 2016-08-11 LAB — LIPID PANEL
CHOLESTEROL TOTAL: 193 mg/dL (ref 100–199)
Chol/HDL Ratio: 3.7 ratio units (ref 0.0–4.4)
HDL: 52 mg/dL (ref >39)
LDL CALC: 124 mg/dL — AB (ref 0–99)
TRIGLYCERIDES: 87 mg/dL (ref 0–149)
VLDL CHOLESTEROL CAL: 17 mg/dL (ref 5–40)

## 2016-08-11 LAB — THYROID PANEL WITH TSH
Free Thyroxine Index: 2.6 (ref 1.2–4.9)
T3 Uptake Ratio: 27 % (ref 24–39)
T4 TOTAL: 9.6 ug/dL (ref 4.5–12.0)
TSH: 1.69 u[IU]/mL (ref 0.450–4.500)

## 2016-08-11 LAB — VITAMIN D 25 HYDROXY (VIT D DEFICIENCY, FRACTURES): Vit D, 25-Hydroxy: 54.8 ng/mL (ref 30.0–100.0)

## 2016-08-14 ENCOUNTER — Encounter: Payer: Self-pay | Admitting: Family

## 2016-08-14 ENCOUNTER — Ambulatory Visit (INDEPENDENT_AMBULATORY_CARE_PROVIDER_SITE_OTHER): Payer: Medicare HMO | Admitting: Family

## 2016-08-14 VITALS — BP 116/75 | HR 81 | Temp 96.9°F | Ht 60.0 in | Wt 140.6 lb

## 2016-08-14 DIAGNOSIS — E785 Hyperlipidemia, unspecified: Secondary | ICD-10-CM

## 2016-08-14 DIAGNOSIS — E559 Vitamin D deficiency, unspecified: Secondary | ICD-10-CM | POA: Diagnosis not present

## 2016-08-14 DIAGNOSIS — Z1211 Encounter for screening for malignant neoplasm of colon: Secondary | ICD-10-CM

## 2016-08-14 DIAGNOSIS — J301 Allergic rhinitis due to pollen: Secondary | ICD-10-CM | POA: Diagnosis not present

## 2016-08-14 DIAGNOSIS — M810 Age-related osteoporosis without current pathological fracture: Secondary | ICD-10-CM | POA: Diagnosis not present

## 2016-08-14 DIAGNOSIS — E039 Hypothyroidism, unspecified: Secondary | ICD-10-CM | POA: Diagnosis not present

## 2016-08-14 DIAGNOSIS — J4540 Moderate persistent asthma, uncomplicated: Secondary | ICD-10-CM

## 2016-08-14 DIAGNOSIS — E663 Overweight: Secondary | ICD-10-CM | POA: Insufficient documentation

## 2016-08-14 NOTE — Patient Instructions (Signed)
Health Maintenance, Female Adopting a healthy lifestyle and getting preventive care can go a long way to promote health and wellness. Talk with your health care provider about what schedule of regular examinations is right for you. This is a good chance for you to check in with your provider about disease prevention and staying healthy. In between checkups, there are plenty of things you can do on your own. Experts have done a lot of research about which lifestyle changes and preventive measures are most likely to keep you healthy. Ask your health care provider for more information. WEIGHT AND DIET  Eat a healthy diet  Be sure to include plenty of vegetables, fruits, low-fat dairy products, and lean protein.  Do not eat a lot of foods high in solid fats, added sugars, or salt.  Get regular exercise. This is one of the most important things you can do for your health.  Most adults should exercise for at least 150 minutes each week. The exercise should increase your heart rate and make you sweat (moderate-intensity exercise).  Most adults should also do strengthening exercises at least twice a week. This is in addition to the moderate-intensity exercise.  Maintain a healthy weight  Body mass index (BMI) is a measurement that can be used to identify possible weight problems. It estimates body fat based on height and weight. Your health care provider can help determine your BMI and help you achieve or maintain a healthy weight.  For females 20 years of age and older:   A BMI below 18.5 is considered underweight.  A BMI of 18.5 to 24.9 is normal.  A BMI of 25 to 29.9 is considered overweight.  A BMI of 30 and above is considered obese.  Watch levels of cholesterol and blood lipids  You should start having your blood tested for lipids and cholesterol at 74 years of age, then have this test every 5 years.  You may need to have your cholesterol levels checked more often if:  Your lipid  or cholesterol levels are high.  You are older than 74 years of age.  You are at high risk for heart disease.  CANCER SCREENING   Lung Cancer  Lung cancer screening is recommended for adults 55-80 years old who are at high risk for lung cancer because of a history of smoking.  A yearly low-dose CT scan of the lungs is recommended for people who:  Currently smoke.  Have quit within the past 15 years.  Have at least a 30-pack-year history of smoking. A pack year is smoking an average of one pack of cigarettes a day for 1 year.  Yearly screening should continue until it has been 15 years since you quit.  Yearly screening should stop if you develop a health problem that would prevent you from having lung cancer treatment.  Breast Cancer  Practice breast self-awareness. This means understanding how your breasts normally appear and feel.  It also means doing regular breast self-exams. Let your health care provider know about any changes, no matter how small.  If you are in your 20s or 30s, you should have a clinical breast exam (CBE) by a health care provider every 1-3 years as part of a regular health exam.  If you are 40 or older, have a CBE every year. Also consider having a breast X-ray (mammogram) every year.  If you have a family history of breast cancer, talk to your health care provider about genetic screening.  If you   are at high risk for breast cancer, talk to your health care provider about having an MRI and a mammogram every year.  Breast cancer gene (BRCA) assessment is recommended for women who have family members with BRCA-related cancers. BRCA-related cancers include:  Breast.  Ovarian.  Tubal.  Peritoneal cancers.  Results of the assessment will determine the need for genetic counseling and BRCA1 and BRCA2 testing. Cervical Cancer Your health care provider may recommend that you be screened regularly for cancer of the pelvic organs (ovaries, uterus, and  vagina). This screening involves a pelvic examination, including checking for microscopic changes to the surface of your cervix (Pap test). You may be encouraged to have this screening done every 3 years, beginning at age 19.  For women ages 57-65, health care providers may recommend pelvic exams and Pap testing every 3 years, or they may recommend the Pap and pelvic exam, combined with testing for human papilloma virus (HPV), every 5 years. Some types of HPV increase your risk of cervical cancer. Testing for HPV may also be done on women of any age with unclear Pap test results.  Other health care providers may not recommend any screening for nonpregnant women who are considered low risk for pelvic cancer and who do not have symptoms. Ask your health care provider if a screening pelvic exam is right for you.  If you have had past treatment for cervical cancer or a condition that could lead to cancer, you need Pap tests and screening for cancer for at least 20 years after your treatment. If Pap tests have been discontinued, your risk factors (such as having a new sexual partner) need to be reassessed to determine if screening should resume. Some women have medical problems that increase the chance of getting cervical cancer. In these cases, your health care provider may recommend more frequent screening and Pap tests. Colorectal Cancer  This type of cancer can be detected and often prevented.  Routine colorectal cancer screening usually begins at 74 years of age and continues through 74 years of age.  Your health care provider may recommend screening at an earlier age if you have risk factors for colon cancer.  Your health care provider may also recommend using home test kits to check for hidden blood in the stool.  A small camera at the end of a tube can be used to examine your colon directly (sigmoidoscopy or colonoscopy). This is done to check for the earliest forms of colorectal  cancer.  Routine screening usually begins at age 38.  Direct examination of the colon should be repeated every 5-10 years through 74 years of age. However, you may need to be screened more often if early forms of precancerous polyps or small growths are found. Skin Cancer  Check your skin from head to toe regularly.  Tell your health care provider about any new moles or changes in moles, especially if there is a change in a mole's shape or color.  Also tell your health care provider if you have a mole that is larger than the size of a pencil eraser.  Always use sunscreen. Apply sunscreen liberally and repeatedly throughout the day.  Protect yourself by wearing long sleeves, pants, a wide-brimmed hat, and sunglasses whenever you are outside. HEART DISEASE, DIABETES, AND HIGH BLOOD PRESSURE   High blood pressure causes heart disease and increases the risk of stroke. High blood pressure is more likely to develop in:  People who have blood pressure in the high end  of the normal range (130-139/85-89 mm Hg).  People who are overweight or obese.  People who are African American.  If you are 38-23 years of age, have your blood pressure checked every 3-5 years. If you are 61 years of age or older, have your blood pressure checked every year. You should have your blood pressure measured twice--once when you are at a hospital or clinic, and once when you are not at a hospital or clinic. Record the average of the two measurements. To check your blood pressure when you are not at a hospital or clinic, you can use:  An automated blood pressure machine at a pharmacy.  A home blood pressure monitor.  If you are between 45 years and 39 years old, ask your health care provider if you should take aspirin to prevent strokes.  Have regular diabetes screenings. This involves taking a blood sample to check your fasting blood sugar level.  If you are at a normal weight and have a low risk for diabetes,  have this test once every three years after 74 years of age.  If you are overweight and have a high risk for diabetes, consider being tested at a younger age or more often. PREVENTING INFECTION  Hepatitis B  If you have a higher risk for hepatitis B, you should be screened for this virus. You are considered at high risk for hepatitis B if:  You were born in a country where hepatitis B is common. Ask your health care provider which countries are considered high risk.  Your parents were born in a high-risk country, and you have not been immunized against hepatitis B (hepatitis B vaccine).  You have HIV or AIDS.  You use needles to inject street drugs.  You live with someone who has hepatitis B.  You have had sex with someone who has hepatitis B.  You get hemodialysis treatment.  You take certain medicines for conditions, including cancer, organ transplantation, and autoimmune conditions. Hepatitis C  Blood testing is recommended for:  Everyone born from [birth year] through 1964.  Anyone with known risk factors for hepatitis C. Sexually transmitted infections (STIs)  You should be screened for sexually transmitted infections (STIs) including gonorrhea and chlamydia if:  You are sexually active and are younger than 74 years of age.  You are older than 74 years of age and your health care provider tells you that you are at risk for this type of infection.  Your sexual activity has changed since you were last screened and you are at an increased risk for chlamydia or gonorrhea. Ask your health care provider if you are at risk.  If you do not have HIV, but are at risk, it may be recommended that you take a prescription medicine daily to prevent HIV infection. This is called pre-exposure prophylaxis (PrEP). You are considered at risk if:  You are sexually active and do not regularly use condoms or know the HIV status of your partner(s).  You take drugs by injection.  You are sexually  active with a partner who has HIV. Talk with your health care provider about whether you are at high risk of being infected with HIV. If you choose to begin PrEP, you should first be tested for HIV. You should then be tested every 3 months for as long as you are taking PrEP.  PREGNANCY   If you are premenopausal and you may become pregnant, ask your health care provider about preconception counseling.  If you may  become pregnant, take 400 to 800 micrograms (mcg) of folic acid every day.  If you want to prevent pregnancy, talk to your health care provider about birth control (contraception). OSTEOPOROSIS AND MENOPAUSE   Osteoporosis is a disease in which the bones lose minerals and strength with aging. This can result in serious bone fractures. Your risk for osteoporosis can be identified using a bone density scan.  If you are 61 years of age or older, or if you are at risk for osteoporosis and fractures, ask your health care provider if you should be screened.  Ask your health care provider whether you should take a calcium or vitamin D supplement to lower your risk for osteoporosis.  Menopause may have certain physical symptoms and risks.  Hormone replacement therapy may reduce some of these symptoms and risks. Talk to your health care provider about whether hormone replacement therapy is right for you.  HOME CARE INSTRUCTIONS   Schedule regular health, dental, and eye exams.  Stay current with your immunizations.   Do not use any tobacco products including cigarettes, chewing tobacco, or electronic cigarettes.  If you are pregnant, do not drink alcohol.  If you are breastfeeding, limit how much and how often you drink alcohol.  Limit alcohol intake to no more than 1 drink per day for nonpregnant women. One drink equals 12 ounces of beer, 5 ounces of wine, or 1 ounces of hard liquor.  Do not use street drugs.  Do not share needles.  Ask your health care provider for help if  you need support or information about quitting drugs.  Tell your health care provider if you often feel depressed.  Tell your health care provider if you have ever been abused or do not feel safe at home.   This information is not intended to replace advice given to you by your health care provider. Make sure you discuss any questions you have with your health care provider.   Document Released: 04/27/2011 Document Revised: 11/02/2014 Document Reviewed: 09/13/2013 Elsevier Interactive Patient Education [employer].

## 2016-08-14 NOTE — Progress Notes (Signed)
Subjective:    Patient ID: [name], female    DOB: 1941-05-30, 75 y.o.   MRN: [id number]  Pt presents to the office today for chronic follow up. Hyperlipidemia  This is a chronic problem. The current episode started more than 1 year ago. The problem is uncontrolled. Recent lipid tests were reviewed and are high. Exacerbating diseases include hypothyroidism and obesity. She has no history of diabetes. Pertinent negatives include no leg pain, myalgias or shortness of breath. Current antihyperlipidemic treatment includes diet change and herbal therapy. The current treatment provides mild improvement of lipids. Risk factors for coronary artery disease include dyslipidemia, hypertension, family history and post-menopausal.  Thyroid Problem  Presents for follow-up visit. Patient reports no anxiety, diaphoresis, diarrhea, dry skin, fatigue, palpitations or weight gain. The symptoms have been stable. Past treatments include levothyroxine. The treatment provided significant relief. Her past medical history is significant for hyperlipidemia. There is no history of diabetes.  Asthma  There is no cough, difficulty breathing, frequent throat clearing, hemoptysis, shortness of breath or wheezing. This is a chronic problem. The current episode started more than 1 year ago. The problem occurs rarely. The problem has been resolved. Pertinent negatives include no dyspnea on exertion, ear congestion, headaches, myalgias, rhinorrhea, sneezing or trouble swallowing. Her symptoms are aggravated by change in weather. Her symptoms are alleviated by rest and steroid inhaler. She reports complete improvement on treatment. Her past medical history is significant for asthma.      Review of Systems  Constitutional: Negative.  Negative for diaphoresis, fatigue and weight gain.  HENT: Negative.  Negative for rhinorrhea, sneezing and trouble swallowing.   Eyes: Negative.   Respiratory: Negative.  Negative for cough,  hemoptysis, shortness of breath and wheezing.   Cardiovascular: Negative.  Negative for dyspnea on exertion and palpitations.  Gastrointestinal: Negative.  Negative for diarrhea.  Endocrine: Negative.   Genitourinary: Negative.   Musculoskeletal: Negative.  Negative for myalgias.  Neurological: Negative.  Negative for headaches.  Hematological: Negative.   Psychiatric/Behavioral: Negative.  The patient is not nervous/anxious.   All other systems reviewed and are negative.      Objective:   Physical Exam  Constitutional: She is oriented to person, place, and time. She appears well-developed and well-nourished. No distress.  HENT:  Head: Normocephalic and atraumatic.  Right Ear: External ear normal.  Left Ear: External ear normal.  Nose: Nose normal.  Mouth/Throat: Oropharynx is clear and moist.  Eyes: Pupils are equal, round, and reactive to light.  Neck: Normal range of motion. Neck supple. No thyromegaly present.  Cardiovascular: Normal rate, regular rhythm, normal heart sounds and intact distal pulses.   No murmur heard. Pulmonary/Chest: Effort normal and breath sounds normal. No respiratory distress. She has no wheezes.  Abdominal: Soft. Bowel sounds are normal. She exhibits no distension. There is no tenderness.  Musculoskeletal: Normal range of motion. She exhibits no edema (2+ in right knee) or tenderness.  Neurological: She is alert and oriented to person, place, and time. She has normal reflexes. No cranial nerve deficit.  Skin: Skin is warm and dry.  Psychiatric: She has a normal mood and affect. Her behavior is normal. Judgment and thought content normal.  Vitals reviewed.   BP 116/75   Pulse 81   Temp (!) 96.9 F (36.1 C) (Oral)   Ht 5' (1.524 m)   Wt 140 lb 9.6 oz (63.8 kg)   BMI 27.46 kg/m       Assessment & Plan:  1.  Hypothyroidism, unspecified type - CMP14+EGFR - Thyroid Panel With TSH  2. Osteoporosis, unspecified osteoporosis type, unspecified  pathological fracture presence - CMP14+EGFR - VITAMIN D 25 Hydroxy (Vit-D Deficiency, Fractures)  3. Hyperlipidemia, unspecified hyperlipidemia type - CMP14+EGFR - Lipid panel  4. Vitamin D deficiency - CMP14+EGFR - VITAMIN D 25 Hydroxy (Vit-D Deficiency, Fractures)  5. Moderate persistent asthma in adult without complication  - WIO97+DZHG  6. Allergic rhinitis due to pollen, unspecified chronicity, unspecified seasonality - CMP14+EGFR  7. Overweight (BMI 25.0-29.9) - CMP14+EGFR  8. Colon cancer screening - CMP14+EGFR - Fecal occult blood, imunochemical; Future   Continue all meds Labs pending Health Maintenance reviewed Diet and exercise encouraged RTO 6 month  Evelina Dun, FNP

## 2016-08-15 LAB — CMP14+EGFR
A/G RATIO: 1.7 (ref 1.2–2.2)
ALBUMIN: 4.3 g/dL (ref 3.5–4.8)
ALK PHOS: 59 IU/L (ref 39–117)
ALT: 22 IU/L (ref 0–32)
AST: 20 IU/L (ref 0–40)
BILIRUBIN TOTAL: 0.3 mg/dL (ref 0.0–1.2)
BUN / CREAT RATIO: 18 (ref 12–28)
BUN: 13 mg/dL (ref 8–27)
CHLORIDE: 103 mmol/L (ref 96–106)
CO2: 26 mmol/L (ref 18–29)
Calcium: 10.2 mg/dL (ref 8.7–10.3)
Creatinine, Ser: 0.73 mg/dL (ref 0.57–1.00)
GFR calc non Af Amer: 81 mL/min/{1.73_m2} (ref >59)
GFR, EST AFRICAN AMERICAN: 94 mL/min/{1.73_m2} (ref >59)
GLUCOSE: 103 mg/dL — AB (ref 65–99)
Globulin, Total: 2.5 g/dL (ref 1.5–4.5)
POTASSIUM: 3.9 mmol/L (ref 3.5–5.2)
Sodium: 142 mmol/L (ref 134–144)
TOTAL PROTEIN: 6.8 g/dL (ref 6.0–8.5)

## 2016-08-15 LAB — THYROID PANEL WITH TSH
FREE THYROXINE INDEX: 2.6 (ref 1.2–4.9)
T3 UPTAKE RATIO: 26 % (ref 24–39)
T4 TOTAL: 9.9 ug/dL (ref 4.5–12.0)
TSH: 1.64 u[IU]/mL (ref 0.450–4.500)

## 2016-08-15 LAB — LIPID PANEL
CHOLESTEROL TOTAL: 196 mg/dL (ref 100–199)
Chol/HDL Ratio: 4 ratio units (ref 0.0–4.4)
HDL: 49 mg/dL (ref >39)
LDL Calculated: 110 mg/dL — ABNORMAL HIGH (ref 0–99)
Triglycerides: 183 mg/dL — ABNORMAL HIGH (ref 0–149)
VLDL Cholesterol Cal: 37 mg/dL (ref 5–40)

## 2016-08-15 LAB — VITAMIN D 25 HYDROXY (VIT D DEFICIENCY, FRACTURES): VIT D 25 HYDROXY: 56.5 ng/mL (ref 30.0–100.0)

## 2016-08-17 ENCOUNTER — Telehealth: Payer: Self-pay | Admitting: Pulmonary Disease

## 2016-08-17 NOTE — Telephone Encounter (Signed)
Called and spoke to pt's husband, Hinton DyerClifford. Hinton DyerClifford states [employer] Corporationpt's insurance will not cover Qvar but will cover Arnuity and Flovent.   Dr. Isaiah SergeMannam, please advise if you would like to change her Qvar to a covered medication. Thanks.

## 2016-08-19 ENCOUNTER — Telehealth: Payer: Self-pay | Admitting: Pulmonary Disease

## 2016-08-19 MED ORDER — FLUTICASONE FUROATE 100 MCG/ACT IN AEPB: 1.0000 | INHALATION_SPRAY | Freq: Every day | RESPIRATORY_TRACT | 6 refills | Status: DC

## 2016-08-19 NOTE — Telephone Encounter (Signed)
Disregard this message.Charm Rings.Erica R Taylor

## 2016-08-19 NOTE — Telephone Encounter (Signed)
Called and spoke with pt and she is aware of change in meds per PM.  arnuity has been sent and the med list has been updated.

## 2016-08-19 NOTE — Telephone Encounter (Signed)
Can prescribe arnuity ellipta once daily

## 2016-08-20 ENCOUNTER — Other Ambulatory Visit: Payer: Self-pay | Admitting: Pulmonary Disease

## 2016-08-21 MED ORDER — BECLOMETHASONE DIPROPIONATE 40 MCG/ACT IN AERS: 2.0000 | INHALATION_SPRAY | Freq: Every day | RESPIRATORY_TRACT | 1 refills | Status: DC

## 2016-08-21 NOTE — Telephone Encounter (Signed)
Called and spoke with pt and she stated that she needed the qvar called in to her pharmacy to last her until the end of the year.  Her insurance will not cover the qvar starting the first of the year and this is why she called to get the other medication sent in .  She stated that the pharmacy is holding the arnuity for the new year. Nothing further is needed.

## 2016-08-24 ENCOUNTER — Telehealth: Payer: Self-pay | Admitting: Pulmonary Disease

## 2016-08-24 MED ORDER — BECLOMETHASONE DIPROPIONATE 80 MCG/ACT IN AERS: 2.0000 | INHALATION_SPRAY | Freq: Two times a day (BID) | RESPIRATORY_TRACT | 2 refills | Status: DC

## 2016-08-24 NOTE — Telephone Encounter (Signed)
Pt aware that a new RX of qvar 80 has been sent it. Pharmacy aware to d/c qvar 40. Pt voiced understanding and had no further questions. Nothing further needed.

## 2016-08-26 ENCOUNTER — Telehealth: Payer: Self-pay | Admitting: Family

## 2016-09-15 ENCOUNTER — Other Ambulatory Visit: Payer: Self-pay | Admitting: Family

## 2016-09-15 DIAGNOSIS — E039 Hypothyroidism, unspecified: Secondary | ICD-10-CM

## 2016-09-22 ENCOUNTER — Telehealth: Payer: Self-pay | Admitting: Family

## 2016-09-22 NOTE — Telephone Encounter (Signed)
[facility] ok to [facility]

## 2016-10-06 ENCOUNTER — Other Ambulatory Visit: Payer: Medicare HMO

## 2016-10-06 DIAGNOSIS — Z1211 Encounter for screening for malignant neoplasm of colon: Secondary | ICD-10-CM

## 2016-10-08 LAB — FECAL OCCULT BLOOD, IMMUNOCHEMICAL: FECAL OCCULT BLD: NEGATIVE

## 2016-10-28 DIAGNOSIS — H524 Presbyopia: Secondary | ICD-10-CM | POA: Diagnosis not present

## 2016-10-28 DIAGNOSIS — H26493 Other secondary cataract, bilateral: Secondary | ICD-10-CM | POA: Diagnosis not present

## 2016-10-28 DIAGNOSIS — Z961 Presence of intraocular lens: Secondary | ICD-10-CM | POA: Diagnosis not present

## 2016-10-28 DIAGNOSIS — H35372 Puckering of macula, left eye: Secondary | ICD-10-CM | POA: Diagnosis not present

## 2016-11-11 ENCOUNTER — Ambulatory Visit: Payer: Medicare HMO | Admitting: Pulmonary Disease

## 2016-11-27 ENCOUNTER — Ambulatory Visit (INDEPENDENT_AMBULATORY_CARE_PROVIDER_SITE_OTHER): Payer: Medicare Other | Admitting: Pulmonary Disease

## 2016-11-27 ENCOUNTER — Encounter: Payer: Self-pay | Admitting: Pulmonary Disease

## 2016-11-27 VITALS — BP 156/80 | HR 56 | Ht 60.0 in | Wt 141.4 lb

## 2016-11-27 DIAGNOSIS — J4530 Mild persistent asthma, uncomplicated: Secondary | ICD-10-CM | POA: Diagnosis not present

## 2016-11-27 DIAGNOSIS — R0602 Shortness of breath: Secondary | ICD-10-CM

## 2016-11-27 NOTE — Patient Instructions (Signed)
It's okay to stop the Qvar. Will send in a prescription for arnutiy  Return to clinic in 6 months.

## 2016-11-27 NOTE — Progress Notes (Signed)
[name]    [id number]    January 15, 1941  Primary Care Physician:Christy Hawks, FNP  Referring Physician: Junie Spencer, FNP [address] [place], [place] [address]  Chief complaint:   Follow-up from mild persistent asthma.  HPI: [name] is a 76 year old with mild persistent asthma. She is a former patient of Dr. Delford Field. Her symptoms are well controlled on steroid inhaler. She does not need to use her albuterol rescue inhaler. She was treated for flu A infection with tamiflu in march 2017.   Interim History: She continues to feel well. She feels the qvar is helping. However it had to be changed to arnuity this year due to insurance reasons. She denies any dyspnea, wheezing, mucus production, hemoptysis.  Outpatient Encounter Prescriptions as of 11/27/2016  Medication Sig  . aspirin EC 81 MG tablet Take 81 mg by mouth daily.    . beclomethasone (QVAR) 80 MCG/ACT inhaler Inhale 2 puffs into the lungs 2 (two) times daily.  . Cholecalciferol (VITAMIN D-3) 1000 UNITS CAPS Take by mouth daily.   . Fluticasone Furoate (ARNUITY ELLIPTA) 100 MCG/ACT AEPB Inhale 1 puff into the lungs daily.  Marland Kitchen levothyroxine (SYNTHROID, LEVOTHROID) 88 MCG tablet Take 1 tablet (88 mcg total) by mouth daily.  Marland Kitchen levothyroxine (SYNTHROID, LEVOTHROID) 88 MCG tablet TAKE ONE TABLET BY MOUTH ONCE DAILY  . meclizine (ANTIVERT) 25 MG tablet Take 1 tablet (25 mg total) by mouth 3 (three) times daily as needed for dizziness.  . Multiple Vitamins-Minerals (CENTRUM SILVER PO) Take 1 tablet by mouth daily. Reported on 12/12/2015  . PROAIR HFA 108 (90 Base) MCG/ACT inhaler INHALE TWO PUFFS BY MOUTH EVERY 6 HOURS AS NEEDED FOR SHORTNESS OF BREATH   No facility-administered encounter medications on file as of 11/27/2016.     Allergies as of 11/27/2016 - Review Complete 11/27/2016  Allergen Reaction Noted  . Sulfonamide derivatives  12/15/2007    Past Medical History:  Diagnosis Date  . Asthma   .  Gallstone pancreatitis   . Hypothyroidism   . Osteoporosis   . Postinflammatory Adkins fibrosis (HCC)   . Vertigo     Past Surgical History:  Procedure Laterality Date  . ABDOMINAL HYSTERECTOMY    . CATARACT EXTRACTION W/PHACO  10/01/2011   Procedure: CATARACT EXTRACTION PHACO AND INTRAOCULAR LENS PLACEMENT (IOC);  Surgeon: Gemma Payor;  Location: [facility];  Service: Ophthalmology;  Laterality: Left;  CDE:13.74  . CATARACT EXTRACTION W/PHACO  10/22/2011   Procedure: CATARACT EXTRACTION PHACO AND INTRAOCULAR LENS PLACEMENT (IOC);  Surgeon: Gemma Payor;  Location: [facility];  Service: Ophthalmology;  Laterality: Right;  CDE:9.98  . CHOLECYSTECTOMY  2010  . TUBAL LIGATION      Family History  Problem Relation Age of Onset  . Heart failure Mother   . Emphysema Mother   . Other Father     blood clot in brain  . Breast cancer Sister   . Cancer Sister     breast  . Breast cancer Sister   . Cancer Sister     breast  . Asthma Sister   . Cancer Brother     lung  . Anesthesia problems Neg Hx   . Hypotension Neg Hx   . Malignant hyperthermia Neg Hx   . Pseudochol deficiency Neg Hx     Social History   Social History  . Marital status: Married    Spouse name: N/A  . Number of children: 3  . Years of  education: N/A   Occupational History  . retired Scientist, product/process developmenttextile worker    Social History Main Topics  . Smoking status: Never Smoker  . Smokeless tobacco: Never Used  . Alcohol use No  . Drug use: No  . Sexual activity: Not on file   Other Topics Concern  . Not on file   Social History Narrative  . No narrative on file    Review of systems: Review of Systems  Constitutional: Negative for fever and chills.  HENT: Negative.   Eyes: Negative for blurred vision.  Respiratory: as per HPI  Cardiovascular: Negative for chest pain and palpitations.  Gastrointestinal: Negative for vomiting, diarrhea, blood per rectum. Genitourinary: Negative for dysuria, urgency, frequency and  hematuria.  Musculoskeletal: Negative for myalgias, back pain and joint pain.  Skin: Negative for itching and rash.  Neurological: Negative for dizziness, tremors, focal weakness, seizures and loss of consciousness.  Endo/Heme/Allergies: Negative for environmental allergies.  Psychiatric/Behavioral: Negative for depression, suicidal ideas and hallucinations.  All other systems reviewed and are negative.  Physical Exam: Blood pressure (!) 156/80, pulse (!) 56, height 5' (1.524 m), weight 141 lb 6.4 oz (64.1 kg), SpO2 98 %. Gen:      No acute distress HEENT:  EOMI, sclera anicteric Neck:     No masses; no thyromegaly Lungs:    Clear to auscultation bilaterally; normal respiratory effort CV:         Regular rate and rhythm; no murmurs Abd:      + bowel sounds; soft, non-tender; no palpable masses, no distension Ext:    No edema; adequate peripheral perfusion Skin:      Warm and dry; no rash Neuro: alert and oriented x 3 Psych: normal mood and affect  Data Reviewed: Chest x-ray 03/31/16-no acute cardiopulmonary abnormality. Images reviewed  CBC    Component Value Date/Time   WBC 7.3 03/28/2013 1525   WBC 7.0 09/22/2011 1020   RBC 4.4 03/28/2013 1525   RBC 4.20 09/22/2011 1020   HGB 13.5 03/28/2013 1525   HGB 12.8 09/22/2011 1020   HCT 39.7 03/28/2013 1525   HCT 39.0 09/22/2011 1020   PLT 240 09/22/2011 1020   MCV 91.1 03/28/2013 1525   MCH 31.0 03/28/2013 1525   MCH 30.5 09/22/2011 1020   MCHC 34.1 03/28/2013 1525   MCHC 32.8 09/22/2011 1020   RDW 13.5 09/22/2011 1020   LYMPHSABS 1.4 04/03/2009 0610   MONOABS 0.8 04/03/2009 0610   EOSABS 0.1 04/03/2009 0610   BASOSABS 0.0 04/03/2009 0610    Assessment:  Mild persistent asthma Stable on inhaled steroids. Inhalers changed from qvar to arnuity. We'll continue to monitor her and make sure her symptoms do not change.  Plan/Recommendations: - OK to switch from qvar to arnuity.  Return in 6 months.  Gabrielle GreathousePraveen Stephanine Reas  MD [name] Pager [phone] 11/27/2016, 11:07 AM  CC: Junie SpencerHawks, Christy A, FNP

## 2017-01-19 DIAGNOSIS — Z803 Family history of malignant neoplasm of breast: Secondary | ICD-10-CM | POA: Diagnosis not present

## 2017-01-19 DIAGNOSIS — Z1231 Encounter for screening mammogram for malignant neoplasm of breast: Secondary | ICD-10-CM | POA: Diagnosis not present

## 2017-03-01 DIAGNOSIS — Z01419 Encounter for gynecological examination (general) (routine) without abnormal findings: Secondary | ICD-10-CM | POA: Diagnosis not present

## 2017-04-08 ENCOUNTER — Other Ambulatory Visit: Payer: Medicare Other

## 2017-04-08 ENCOUNTER — Other Ambulatory Visit: Payer: Self-pay | Admitting: Family

## 2017-04-08 DIAGNOSIS — E039 Hypothyroidism, unspecified: Secondary | ICD-10-CM | POA: Diagnosis not present

## 2017-04-08 DIAGNOSIS — E559 Vitamin D deficiency, unspecified: Secondary | ICD-10-CM

## 2017-04-08 DIAGNOSIS — E785 Hyperlipidemia, unspecified: Secondary | ICD-10-CM | POA: Diagnosis not present

## 2017-04-08 DIAGNOSIS — J4540 Moderate persistent asthma, uncomplicated: Secondary | ICD-10-CM | POA: Diagnosis not present

## 2017-04-09 ENCOUNTER — Other Ambulatory Visit: Payer: Self-pay | Admitting: Family

## 2017-04-09 LAB — THYROID PANEL WITH TSH
Free Thyroxine Index: 3 (ref 1.2–4.9)
T3 Uptake Ratio: 28 % (ref 24–39)
T4, Total: 10.7 ug/dL (ref 4.5–12.0)
TSH: 1.53 u[IU]/mL (ref 0.450–4.500)

## 2017-04-09 LAB — VITAMIN D 25 HYDROXY (VIT D DEFICIENCY, FRACTURES): Vit D, 25-Hydroxy: 82.1 ng/mL (ref 30.0–100.0)

## 2017-04-09 LAB — CMP14+EGFR
ALT: 18 IU/L (ref 0–32)
AST: 22 IU/L (ref 0–40)
Albumin/Globulin Ratio: 1.8 (ref 1.2–2.2)
Albumin: 4.4 g/dL (ref 3.5–4.8)
Alkaline Phosphatase: 61 IU/L (ref 39–117)
BUN/Creatinine Ratio: 17 (ref 12–28)
BUN: 13 mg/dL (ref 8–27)
Bilirubin Total: 0.4 mg/dL (ref 0.0–1.2)
CO2: 24 mmol/L (ref 20–29)
Calcium: 9.6 mg/dL (ref 8.7–10.3)
Chloride: 106 mmol/L (ref 96–106)
Creatinine, Ser: 0.78 mg/dL (ref 0.57–1.00)
GFR calc Af Amer: 86 mL/min/{1.73_m2} (ref >59)
GFR calc non Af Amer: 75 mL/min/{1.73_m2} (ref >59)
Globulin, Total: 2.5 g/dL (ref 1.5–4.5)
Glucose: 93 mg/dL (ref 65–99)
Potassium: 4.3 mmol/L (ref 3.5–5.2)
Sodium: 143 mmol/L (ref 134–144)
Total Protein: 6.9 g/dL (ref 6.0–8.5)

## 2017-04-09 LAB — LIPID PANEL
Chol/HDL Ratio: 3.9 ratio (ref 0.0–4.4)
Cholesterol, Total: 188 mg/dL (ref 100–199)
HDL: 48 mg/dL (ref >39)
LDL Calculated: 115 mg/dL — ABNORMAL HIGH (ref 0–99)
Triglycerides: 126 mg/dL (ref 0–149)
VLDL Cholesterol Cal: 25 mg/dL (ref 5–40)

## 2017-04-15 ENCOUNTER — Ambulatory Visit (INDEPENDENT_AMBULATORY_CARE_PROVIDER_SITE_OTHER): Payer: Medicare Other | Admitting: Family

## 2017-04-15 ENCOUNTER — Encounter: Payer: Self-pay | Admitting: Family

## 2017-04-15 VITALS — BP 139/80 | HR 71 | Temp 97.2°F | Ht 60.0 in | Wt 138.0 lb

## 2017-04-15 DIAGNOSIS — E559 Vitamin D deficiency, unspecified: Secondary | ICD-10-CM | POA: Diagnosis not present

## 2017-04-15 DIAGNOSIS — E785 Hyperlipidemia, unspecified: Secondary | ICD-10-CM

## 2017-04-15 DIAGNOSIS — M810 Age-related osteoporosis without current pathological fracture: Secondary | ICD-10-CM | POA: Diagnosis not present

## 2017-04-15 DIAGNOSIS — E039 Hypothyroidism, unspecified: Secondary | ICD-10-CM | POA: Diagnosis not present

## 2017-04-15 DIAGNOSIS — E663 Overweight: Secondary | ICD-10-CM | POA: Diagnosis not present

## 2017-04-15 DIAGNOSIS — Z1211 Encounter for screening for malignant neoplasm of colon: Secondary | ICD-10-CM

## 2017-04-15 DIAGNOSIS — J4540 Moderate persistent asthma, uncomplicated: Secondary | ICD-10-CM

## 2017-04-15 NOTE — Patient Instructions (Signed)

## 2017-04-15 NOTE — Progress Notes (Signed)
   Subjective:    Patient ID: [name], female    DOB: [birth year], 76 y.o.   MRN: [id number]  Pt presents to the office today for chronic follow up. Thyroid Problem  Presents for follow-up visit. Symptoms include fatigue. Patient reports no constipation, depressed mood or diarrhea. The symptoms have been stable. Her past medical history is significant for hyperlipidemia.  Asthma  She complains of shortness of breath ("at times"). There is no cough, difficulty breathing, sputum production or wheezing. This is a chronic problem. The current episode started more than 1 year ago. The problem occurs intermittently. The problem has been waxing and waning. Her symptoms are alleviated by rest. She reports moderate improvement on treatment. Her symptoms are not alleviated by rest. Her past medical history is significant for asthma.  Hyperlipidemia  This is a chronic problem. The current episode started more than 1 year ago. The problem is uncontrolled. Recent lipid tests were reviewed and are high. Associated symptoms include shortness of breath ("at times"). Current antihyperlipidemic treatment includes diet change. The current treatment provides moderate improvement of lipids. Risk factors for coronary artery disease include dyslipidemia.  Osteoporosis Pt last Dexa Scan 12/02/15. Pt takes Vit D, but does not take calcium.    Review of Systems  Constitutional: Positive for fatigue.  Respiratory: Positive for shortness of breath ("at times"). Negative for cough, sputum production and wheezing.   Gastrointestinal: Negative for constipation and diarrhea.  All other systems reviewed and are negative.      Objective:   Physical Exam  Constitutional: She is oriented to person, place, and time. She appears well-developed and well-nourished. No distress.  HENT:  Head: Normocephalic and atraumatic.  Right Ear: External ear normal.  Left Ear: External ear normal.  Nose: Nose normal.  Mouth/Throat:  Oropharynx is clear and moist.  Eyes: Pupils are equal, round, and reactive to light.  Neck: Normal range of motion. Neck supple. No thyromegaly present.  Cardiovascular: Normal rate, regular rhythm, normal heart sounds and intact distal pulses.   No murmur heard. Pulmonary/Chest: Effort normal and breath sounds normal. No respiratory distress. She has no wheezes.  Abdominal: Soft. Bowel sounds are normal. She exhibits no distension. There is no tenderness.  Musculoskeletal: Normal range of motion. She exhibits no edema or tenderness.  Neurological: She is alert and oriented to person, place, and time.  Skin: Skin is warm and dry.  Psychiatric: She has a normal mood and affect. Her behavior is normal. Judgment and thought content normal.  Vitals reviewed.    BP 139/80   Pulse 71   Temp 97.2 F (36.2 C) (Oral)   Ht 5' (1.524 m)   Wt 138 lb (62.6 kg)   BMI 26.95 kg/m      Assessment & Plan:  1. Hypothyroidism, unspecified type  2. Osteoporosis, unspecified osteoporosis type, unspecified pathological fracture presence  3. Overweight (BMI 25.0-29.9)  4. Vitamin D deficiency  5. Moderate persistent asthma in adult without complication  6. Hyperlipidemia, unspecified hyperlipidemia type  7. Colon cancer screening - Fecal occult blood, imunochemical; Future   Continue all meds Labs discussed  Health Maintenance reviewed Diet and exercise encouraged RTO 6 months   Jannifer Rodneyhristy Arlana Canizales, FNP

## 2017-05-27 ENCOUNTER — Other Ambulatory Visit (INDEPENDENT_AMBULATORY_CARE_PROVIDER_SITE_OTHER): Payer: Medicare Other

## 2017-05-27 ENCOUNTER — Encounter: Payer: Self-pay | Admitting: Pulmonary Disease

## 2017-05-27 ENCOUNTER — Ambulatory Visit (INDEPENDENT_AMBULATORY_CARE_PROVIDER_SITE_OTHER)
Admission: RE | Admit: 2017-05-27 | Discharge: 2017-05-27 | Disposition: A | Discharge status: Home / Self Care | Payer: Medicare Other | Source: Ambulatory Visit | Attending: Pulmonary Disease | Admitting: Pulmonary Disease

## 2017-05-27 ENCOUNTER — Ambulatory Visit (INDEPENDENT_AMBULATORY_CARE_PROVIDER_SITE_OTHER): Payer: Medicare Other | Admitting: Pulmonary Disease

## 2017-05-27 VITALS — BP 134/72 | HR 71 | Ht 60.0 in | Wt 138.4 lb

## 2017-05-27 DIAGNOSIS — R5383 Other fatigue: Secondary | ICD-10-CM

## 2017-05-27 DIAGNOSIS — R0602 Shortness of breath: Secondary | ICD-10-CM

## 2017-05-27 DIAGNOSIS — R5382 Chronic fatigue, unspecified: Secondary | ICD-10-CM | POA: Diagnosis not present

## 2017-05-27 LAB — NITRIC OXIDE: NITRIC OXIDE: 5

## 2017-05-27 LAB — CBC WITH DIFFERENTIAL/PLATELET
BASOS ABS: 0.1 10*3/uL (ref 0.0–0.1)
BASOS PCT: 0.9 % (ref 0.0–3.0)
EOS ABS: 0.1 10*3/uL (ref 0.0–0.7)
Eosinophils Relative: 2 % (ref 0.0–5.0)
HEMATOCRIT: 41.5 % (ref 36.0–46.0)
Hemoglobin: 13.6 g/dL (ref 12.0–15.0)
LYMPHS ABS: 1.5 10*3/uL (ref 0.7–4.0)
LYMPHS PCT: 24.9 % (ref 12.0–46.0)
MCHC: 32.8 g/dL (ref 30.0–36.0)
MCV: 91.9 fl (ref 78.0–100.0)
Monocytes Absolute: 0.6 10*3/uL (ref 0.1–1.0)
Monocytes Relative: 9.4 % (ref 3.0–12.0)
NEUTROS ABS: 3.8 10*3/uL (ref 1.4–7.7)
NEUTROS PCT: 62.8 % (ref 43.0–77.0)
PLATELETS: 252 10*3/uL (ref 150.0–400.0)
RBC: 4.51 Mil/uL (ref 3.87–5.11)
RDW: 13.7 % (ref 11.5–15.5)
WBC: 6 10*3/uL (ref 4.0–10.5)

## 2017-05-27 LAB — COMPREHENSIVE METABOLIC PANEL
ALT: 18 U/L (ref 0–35)
AST: 18 U/L (ref 0–37)
Albumin: 4.5 g/dL (ref 3.5–5.2)
Alkaline Phosphatase: 55 U/L (ref 39–117)
BUN: 13 mg/dL (ref 6–23)
CHLORIDE: 105 meq/L (ref 96–112)
CO2: 29 meq/L (ref 19–32)
Calcium: 10.3 mg/dL (ref 8.4–10.5)
Creatinine, Ser: 0.76 mg/dL (ref 0.40–1.20)
GFR: 78.8 mL/min (ref >60.00)
GLUCOSE: 96 mg/dL (ref 70–99)
Potassium: 3.7 mEq/L (ref 3.5–5.1)
SODIUM: 141 meq/L (ref 135–145)
Total Bilirubin: 0.5 mg/dL (ref 0.2–1.2)
Total Protein: 7.3 g/dL (ref 6.0–8.3)

## 2017-05-27 LAB — TSH: TSH: 1.23 u[IU]/mL (ref 0.35–4.50)

## 2017-05-27 NOTE — Patient Instructions (Signed)
We'll get some blood tests today including comprehensive metabolic panel, CBC, TSH We will get a chest x-ray today. I'll call you if these results are abnormal Continue using inhalers as prescribed If his symptoms of fatigue worsened then you may need to go to the ED or check with her primary care Return to clinic in 6 months.

## 2017-05-27 NOTE — Progress Notes (Signed)
[name]    [id number]    [birth year]  Primary Care Physician:Hawks, Edilia Bohristy A, FNP  Referring Physician: Junie SpencerHawks, Christy A, FNP [address] [place], [place] [id number]  Chief complaint:   Follow-up from mild persistent asthma.  HPI: [name] is a 76 year old with mild persistent asthma. She is a former patient of Dr. Delford FieldWright. Her symptoms are well controlled on steroid inhaler. She does not need to use her albuterol rescue inhaler. She was treated for flu A infection with tamiflu in march 2017.   Interim History: Qvar has been changed to arnuity in [place] of this year due to insurance reasons. His symptoms of asthma has remained stable on it. For the past few months she has been complaining of fatigue, low energy, occasional voice hoarseness. This is associated with dyspnea that occurs at rest.  She denies any cough, sputum production, fevers, chills, wheezing. There is no loss of weight, loss of appetite.  Outpatient Encounter Prescriptions as of 05/27/2017  Medication Sig  . aspirin EC 81 MG tablet Take 81 mg by mouth daily.    . Cholecalciferol (VITAMIN D-3) 1000 UNITS CAPS Take 5,000 Units by mouth daily.   . Fluticasone Furoate (ARNUITY ELLIPTA) 100 MCG/ACT AEPB Inhale 1 puff into the lungs daily.  Marland Kitchen. levothyroxine (SYNTHROID, LEVOTHROID) 88 MCG tablet TAKE ONE TABLET BY MOUTH ONCE DAILY  . meclizine (ANTIVERT) 25 MG tablet Take 1 tablet (25 mg total) by mouth 3 (three) times daily as needed for dizziness.  . Multiple Vitamins-Minerals (CENTRUM SILVER PO) Take 1 tablet by mouth daily. Reported on 12/12/2015  . PROAIR HFA 108 (90 Base) MCG/ACT inhaler INHALE TWO PUFFS BY MOUTH EVERY 6 HOURS AS NEEDED FOR SHORTNESS OF BREATH   No facility-administered encounter medications on file as of 05/27/2017.     Allergies as of 05/27/2017 - Review Complete 05/27/2017  Allergen Reaction Noted  . Sulfonamide derivatives  12/15/2007    Past Medical History:  Diagnosis  Date  . Asthma   . Gallstone pancreatitis   . Hypothyroidism   . Osteoporosis   . Postinflammatory pulmonary fibrosis (HCC)   . Vertigo     Past Surgical History:  Procedure Laterality Date  . ABDOMINAL HYSTERECTOMY    . CATARACT EXTRACTION W/PHACO  10/01/2011   Procedure: CATARACT EXTRACTION PHACO AND INTRAOCULAR LENS PLACEMENT (IOC);  Surgeon: Gemma PayorKerry Hunt;  Location: [facility];  Service: Ophthalmology;  Laterality: Left;  CDE:13.74  . CATARACT EXTRACTION W/PHACO  10/22/2011   Procedure: CATARACT EXTRACTION PHACO AND INTRAOCULAR LENS PLACEMENT (IOC);  Surgeon: Gemma PayorKerry Hunt;  Location: [facility];  Service: Ophthalmology;  Laterality: Right;  CDE:9.98  . CHOLECYSTECTOMY  2010  . TUBAL LIGATION      Family History  Problem Relation Age of Onset  . Heart failure Mother   . Emphysema Mother   . Other Father        blood clot in brain  . Breast cancer Sister   . Cancer Sister        breast  . Breast cancer Sister   . Cancer Sister        breast  . Asthma Sister   . Cancer Brother        lung  . Anesthesia problems Neg Hx   . Hypotension Neg Hx   . Malignant hyperthermia Neg Hx   . Pseudochol deficiency Neg Hx     Social History   Social History  . Marital status: Married  Spouse name: N/A  . Number of children: 3  . Years of education: N/A   Occupational History  . retired Scientist, product/process developmenttextile worker    Social History Main Topics  . Smoking status: Never Smoker  . Smokeless tobacco: Never Used  . Alcohol use No  . Drug use: No  . Sexual activity: Not on file   Other Topics Concern  . Not on file   Social History Narrative  . No narrative on file    Review of systems: Review of Systems  Constitutional: Negative for fever and chills.  HENT: Negative.   Eyes: Negative for blurred vision.  Respiratory: as per HPI  Cardiovascular: Negative for chest pain and palpitations.  Gastrointestinal: Negative for vomiting, diarrhea, blood per rectum. Genitourinary: Negative for  dysuria, urgency, frequency and hematuria.  Musculoskeletal: Negative for myalgias, back pain and joint pain.  Skin: Negative for itching and rash.  Neurological: Negative for dizziness, tremors, focal weakness, seizures and loss of consciousness.  Endo/Heme/Allergies: Negative for environmental allergies.  Psychiatric/Behavioral: Negative for depression, suicidal ideas and hallucinations.  All other systems reviewed and are negative.  Physical Exam: Blood pressure 134/72, pulse 71, height 5' (1.524 m), weight 62.8 kg (138 lb 6.4 oz), SpO2 96 %. Gen:      No acute distress HEENT:  EOMI, sclera anicteric Neck:     No masses; no thyromegaly Lungs:    Clear to auscultation bilaterally; normal respiratory effort CV:         Regular rate and rhythm; no murmurs Abd:      + bowel sounds; soft, non-tender; no palpable masses, no distension Ext:    No edema; adequate peripheral perfusion Skin:      Warm and dry; no rash Neuro: alert and oriented x 3 Psych: normal mood and affect  Data Reviewed: Chest x-ray 03/31/16-no acute cardiopulmonary abnormality. Images reviewed  FENO 05/27/17- 5  Assessment:  Mild persistent asthma She continues on Arnuity  She has vague complaints as noted above. I do believe her asthma is getting out of control as FENO is low in office today and she is not wheezing. We'll check a chest x-ray and some labs including TSH. I have encouraged her to follow-up with her primary care or go to the ED if there is any worsening of her symptoms.  Plan/Recommendations: - Continue arnuity - Check CMP, CBC, TSH, CXR  Return in 6 months.  Chilton GreathousePraveen Cieara Stierwalt MD [facility] Pager [phone] 05/27/2017, 11:19 AM  CC: Junie SpencerHawks, Christy A, FNP

## 2017-06-24 ENCOUNTER — Other Ambulatory Visit: Payer: Medicare Other

## 2017-06-24 DIAGNOSIS — Z1211 Encounter for screening for malignant neoplasm of colon: Secondary | ICD-10-CM | POA: Diagnosis not present

## 2017-06-28 LAB — FECAL OCCULT BLOOD, IMMUNOCHEMICAL: FECAL OCCULT BLD: NEGATIVE

## 2017-07-15 DIAGNOSIS — M79672 Pain in left foot: Secondary | ICD-10-CM | POA: Diagnosis not present

## 2017-07-15 DIAGNOSIS — M2012 Hallux valgus (acquired), left foot: Secondary | ICD-10-CM | POA: Diagnosis not present

## 2017-07-15 DIAGNOSIS — M2042 Other hammer toe(s) (acquired), left foot: Secondary | ICD-10-CM | POA: Diagnosis not present

## 2017-07-15 DIAGNOSIS — M7742 Metatarsalgia, left foot: Secondary | ICD-10-CM | POA: Diagnosis not present

## 2017-07-30 ENCOUNTER — Ambulatory Visit (INDEPENDENT_AMBULATORY_CARE_PROVIDER_SITE_OTHER): Payer: Medicare Other | Admitting: *Deleted

## 2017-07-30 DIAGNOSIS — Z23 Encounter for immunization: Secondary | ICD-10-CM | POA: Diagnosis not present

## 2017-08-10 DIAGNOSIS — M7742 Metatarsalgia, left foot: Secondary | ICD-10-CM | POA: Diagnosis not present

## 2017-08-14 ENCOUNTER — Other Ambulatory Visit: Payer: Self-pay | Admitting: Pulmonary Disease

## 2017-08-18 ENCOUNTER — Telehealth: Payer: Self-pay | Admitting: Pulmonary Disease

## 2017-08-18 MED ORDER — FLUTICASONE FUROATE 100 MCG/ACT IN AEPB: 1.0000 | INHALATION_SPRAY | Freq: Every day | RESPIRATORY_TRACT | 6 refills | Status: DC

## 2017-08-18 NOTE — Telephone Encounter (Signed)
Called and spoke with pt and she is aware of refill that has been sent to her pharmacy.  Nothing further is needed.  

## 2017-08-20 ENCOUNTER — Telehealth: Payer: Self-pay | Admitting: Pulmonary Disease

## 2017-08-20 MED ORDER — FLUTICASONE FUROATE 100 MCG/ACT IN AEPB: 1.0000 | INHALATION_SPRAY | Freq: Every day | RESPIRATORY_TRACT | 6 refills | Status: DC

## 2017-09-27 ENCOUNTER — Other Ambulatory Visit: Payer: Self-pay | Admitting: Family

## 2017-09-27 DIAGNOSIS — E039 Hypothyroidism, unspecified: Secondary | ICD-10-CM

## 2017-10-12 DIAGNOSIS — M79672 Pain in left foot: Secondary | ICD-10-CM | POA: Diagnosis not present

## 2017-10-12 DIAGNOSIS — M7741 Metatarsalgia, right foot: Secondary | ICD-10-CM | POA: Diagnosis not present

## 2017-10-12 DIAGNOSIS — M79671 Pain in right foot: Secondary | ICD-10-CM | POA: Diagnosis not present

## 2017-10-12 DIAGNOSIS — M7742 Metatarsalgia, left foot: Secondary | ICD-10-CM | POA: Diagnosis not present

## 2017-10-14 DIAGNOSIS — H73822 Atrophic nonflaccid tympanic membrane, left ear: Secondary | ICD-10-CM | POA: Diagnosis not present

## 2017-10-14 DIAGNOSIS — H6121 Impacted cerumen, right ear: Secondary | ICD-10-CM | POA: Diagnosis not present

## 2017-10-14 DIAGNOSIS — H7312 Chronic myringitis, left ear: Secondary | ICD-10-CM | POA: Diagnosis not present

## 2017-10-14 DIAGNOSIS — H903 Sensorineural hearing loss, bilateral: Secondary | ICD-10-CM | POA: Diagnosis not present

## 2017-10-17 IMAGING — CR DG KNEE 1-2V*R*
2 series · 2 of 2 positions shown · non-contrast
Comparison: None.

CLINICAL DATA: FALL YESTERDAY.

EXAM:
RIGHT KNEE - 2 VIEW
LEFT MO11-G VIEW

[view not recorded (1 of 2)]
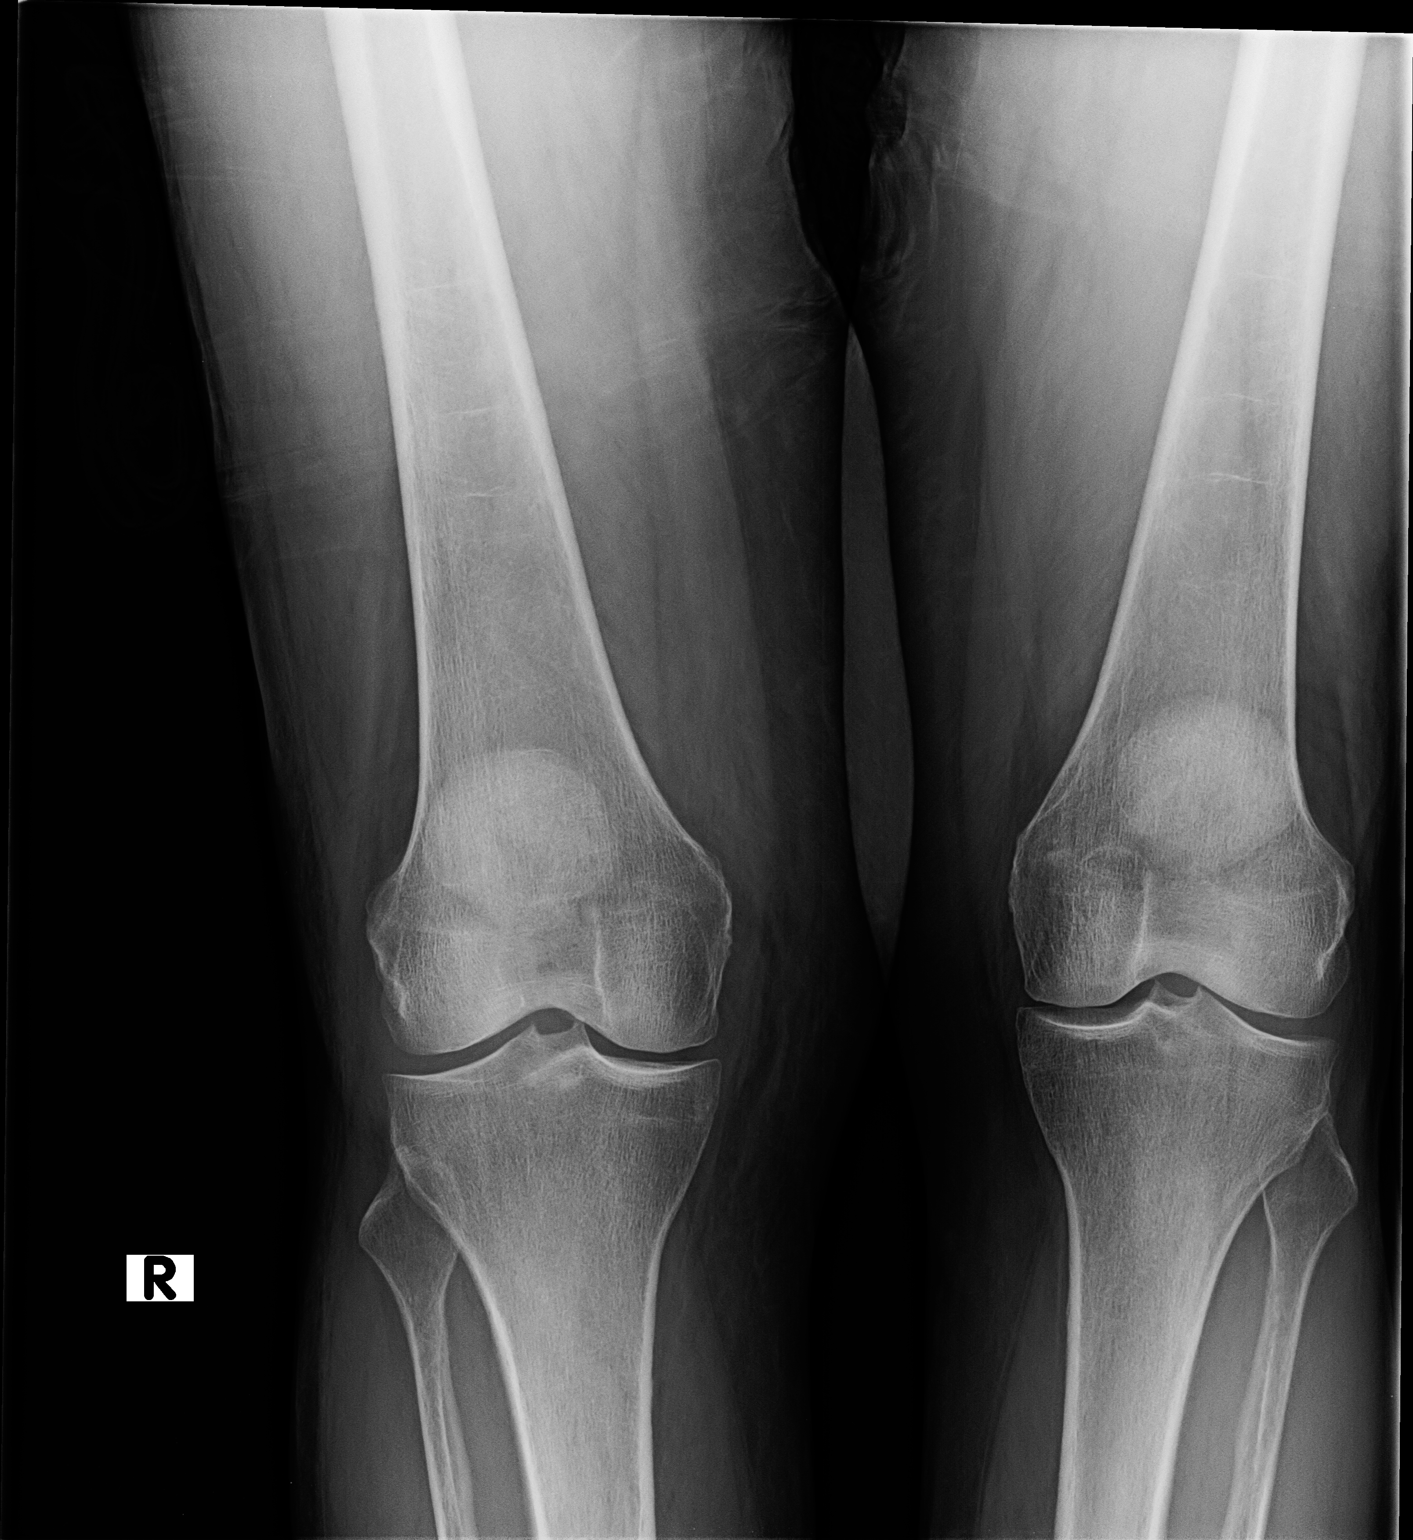

[view not recorded (2 of 2)]
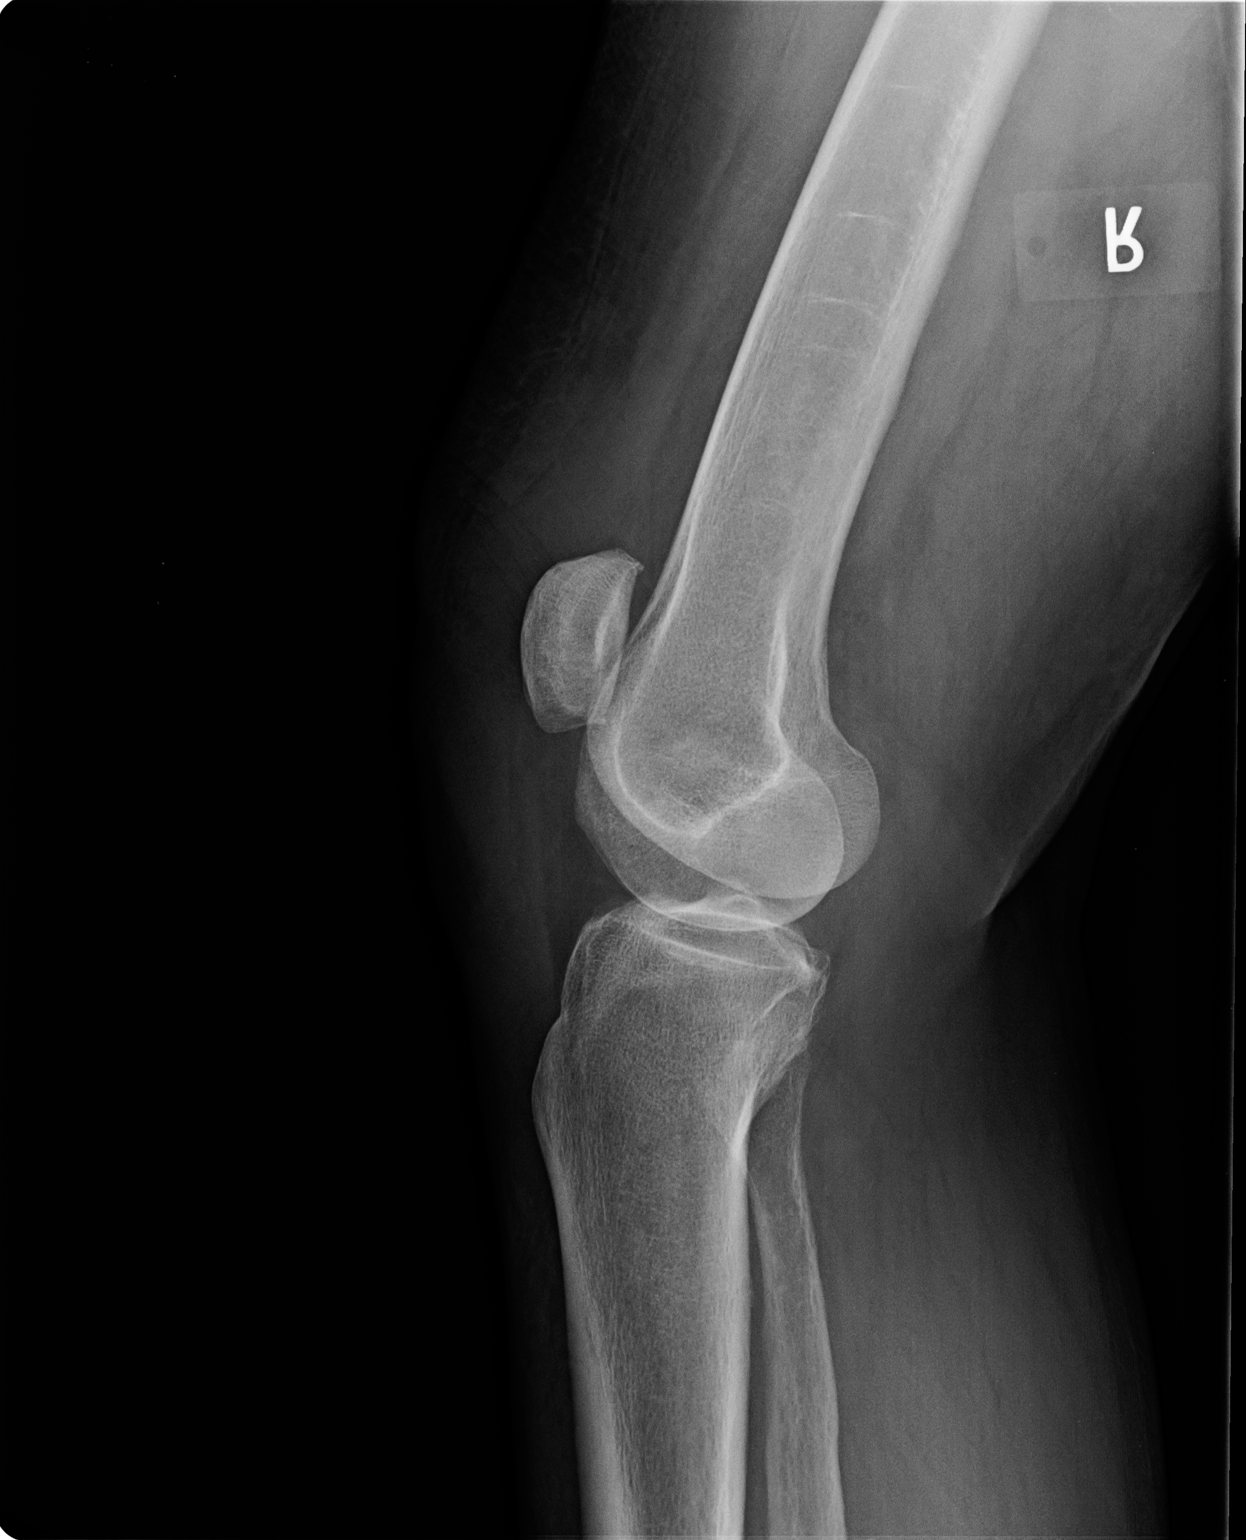

[2 of 2 positions shown; findings below may reference images not displayed]

FINDINGS: Right knee: Two views the right knee demonstrate mild medial
compartmental articular space narrowing and marginal spurring along
the patella, without a discrete knee effusion. There is abnormal
prepatellar soft tissue swelling which could be from edema or
prepatellar bursitis. Borderline high Insall-Salvati ratio at 1.49.

Left knee: A frontal view of the left knee demonstrates moderate
medial compartmental articular space narrowing. Minimal spurring
along the tibial spine.
IMPRESSION: 1. Prepatellar soft tissue swelling on the right suggesting
prepatellar bursitis or subcutaneous infiltrative edema in the
prepatellar region.
2. Marginal spurring along the right patella. Borderline appearance
for patella alta although symmetric to the contralateral side.
3. Mild medial compartmental narrowing bilaterally due to
osteoarthritis.

## 2017-11-18 ENCOUNTER — Encounter: Payer: Self-pay | Admitting: Pulmonary Disease

## 2017-11-18 ENCOUNTER — Ambulatory Visit: Payer: Medicare Other | Admitting: Pulmonary Disease

## 2017-11-18 VITALS — BP 124/78 | HR 79 | Ht 60.0 in | Wt 136.6 lb

## 2017-11-18 DIAGNOSIS — J4530 Mild persistent asthma, uncomplicated: Secondary | ICD-10-CM

## 2017-11-18 MED ORDER — ALBUTEROL SULFATE HFA 108 (90 BASE) MCG/ACT IN AERS: 2.0000 | INHALATION_SPRAY | Freq: Four times a day (QID) | RESPIRATORY_TRACT | 1 refills | Status: DC | PRN

## 2017-11-18 MED ORDER — FLUTICASONE FUROATE 100 MCG/ACT IN AEPB: 1.0000 | INHALATION_SPRAY | Freq: Every day | RESPIRATORY_TRACT | 6 refills | Status: DC

## 2017-11-18 NOTE — Patient Instructions (Addendum)
I am glad that your breathing is stable without any issues Continue your inhalers as prescribed.  We will send in refills for arnuity and albuterol inhaler. Follow-up in 1 year.  Please call our office sooner if there is any change in his symptoms.

## 2017-11-18 NOTE — Progress Notes (Signed)
[name]    [id number]    08-03-1941  Primary Care Physician:Adkins, Gabrielle Bo, FNP  Referring Physician: Junie Spencer, FNP [address] [place], [place] [address]  Chief complaint:   Follow-up from mild persistent asthma.  HPI: [name] is a 77 year old with mild persistent asthma. She is a former patient of Dr. Delford Adkins. Her symptoms are well controlled on steroid inhaler. She does not need to use her albuterol rescue inhaler. She was treated for flu A infection with tamiflu in march 2017.  Qvar has been changed to arnuity in Jan 2018 due to insurance reasons. His symptoms of asthma has remained stable on it.  Interim History: She feels well with no issues.  Stable on her inhalers with no exacerbations.  Outpatient Encounter Medications as of 11/18/2017  Medication Sig  . aspirin EC 81 MG tablet Take 81 mg by mouth daily.    . Cholecalciferol (VITAMIN D-3) 1000 UNITS CAPS Take 5,000 Units by mouth daily.   . Fluticasone Furoate (ARNUITY ELLIPTA) 100 MCG/ACT AEPB Inhale 1 puff into the lungs daily.  Marland Kitchen levothyroxine (SYNTHROID, LEVOTHROID) 88 MCG tablet TAKE ONE TABLET BY MOUTH ONCE DAILY  . meclizine (ANTIVERT) 25 MG tablet Take 1 tablet (25 mg total) by mouth 3 (three) times daily as needed for dizziness.  . Multiple Vitamins-Minerals (CENTRUM SILVER PO) Take 1 tablet by mouth daily. Reported on 12/12/2015  . PROAIR HFA 108 (90 Base) MCG/ACT inhaler INHALE TWO PUFFS BY MOUTH EVERY 6 HOURS AS NEEDED FOR SHORTNESS OF BREATH   No facility-administered encounter medications on file as of 11/18/2017.     Allergies as of 11/18/2017 - Review Complete 11/18/2017  Allergen Reaction Noted  . Sulfonamide derivatives  12/15/2007    Past Medical History:  Diagnosis Date  . Asthma   . Gallstone pancreatitis   . Hypothyroidism   . Osteoporosis   . Postinflammatory pulmonary fibrosis (HCC)   . Vertigo     Past Surgical History:  Procedure Laterality Date    . ABDOMINAL HYSTERECTOMY    . CATARACT EXTRACTION W/PHACO  10/01/2011   Procedure: CATARACT EXTRACTION PHACO AND INTRAOCULAR LENS PLACEMENT (IOC);  Surgeon: [name];  Location: [facility];  Service: Ophthalmology;  Laterality: Left;  CDE:13.74  . CATARACT EXTRACTION W/PHACO  10/22/2011   Procedure: CATARACT EXTRACTION PHACO AND INTRAOCULAR LENS PLACEMENT (IOC);  Surgeon: [name];  Location: [facility];  Service: Ophthalmology;  Laterality: Right;  CDE:9.98  . CHOLECYSTECTOMY  2010  . TUBAL LIGATION      Family History  Problem Relation Age of Onset  . Heart failure Mother   . Emphysema Mother   . Other Father        blood clot in brain  . Breast cancer Sister   . Cancer Sister        breast  . Breast cancer Sister   . Cancer Sister        breast  . Asthma Sister   . Cancer Brother        lung  . Anesthesia problems Neg Hx   . Hypotension Neg Hx   . Malignant hyperthermia Neg Hx   . Pseudochol deficiency Neg Hx     Social History   Socioeconomic History  . Marital status: Married    Spouse name: Not on file  . Number of children: 3  . Years of education: Not on file  . Highest education level: Not on file  Social Needs  . Financial resource strain: Not on file  . Food insecurity - worry: Not on file  . Food insecurity - inability: Not on file  . Transportation needs - medical: Not on file  . Transportation needs - non-medical: Not on file  Occupational History  . Occupation: retired Scientist, product/process developmenttextile worker  Tobacco Use  . Smoking status: Never Smoker  . Smokeless tobacco: Never Used  Substance and Sexual Activity  . Alcohol use: No  . Drug use: No  . Sexual activity: Not on file  Other Topics Concern  . Not on file  Social History Narrative  . Not on file   Review of systems: Review of Systems  Constitutional: Negative for fever and chills.  HENT: Negative.   Eyes: Negative for blurred vision.  Respiratory: as per HPI  Cardiovascular: Negative for chest pain and  palpitations.  Gastrointestinal: Negative for vomiting, diarrhea, blood per rectum. Genitourinary: Negative for dysuria, urgency, frequency and hematuria.  Musculoskeletal: Negative for myalgias, back pain and joint pain.  Skin: Negative for itching and rash.  Neurological: Negative for dizziness, tremors, focal weakness, seizures and loss of consciousness.  Endo/Heme/Allergies: Negative for environmental allergies.  Psychiatric/Behavioral: Negative for depression, suicidal ideas and hallucinations.  All other systems reviewed and are negative.  Physical Exam: Blood pressure 134/72, pulse 71, height 5' (1.524 m), weight 62.8 kg (138 lb 6.4 oz), SpO2 96 %. Gen:      No acute distress HEENT:  EOMI, sclera anicteric Neck:     No masses; no thyromegaly Lungs:    Clear to auscultation bilaterally; normal respiratory effort CV:         Regular rate and rhythm; no murmurs Abd:      + bowel sounds; soft, non-tender; no palpable masses, no distension Ext:    No edema; adequate peripheral perfusion Skin:      Warm and dry; no rash Neuro: alert and oriented x 3 Psych: normal mood and affect  Data Reviewed: Chest x-ray 03/31/16-no acute cardiopulmonary abnormality. Images reviewed  FENO 05/27/17- 5  Assessment:  Mild persistent asthma She continues on Arnuity and albuterol as needed I feel she can come off her controller medication as symptoms are stable.  However she like to continue it as she is worried about exacerbation and lives far from any medical facility.  Reevaluate in 1 year  Plan/Recommendations: - Continue arnuity  Follow up in 1 year  Gabrielle GreathousePraveen Marissa Weaver MD [facility] Pager [phone] 11/18/2017, 2:12 PM  CC: Gabrielle SpencerHawks, Gabrielle A, FNP

## 2017-12-01 ENCOUNTER — Ambulatory Visit (INDEPENDENT_AMBULATORY_CARE_PROVIDER_SITE_OTHER): Payer: Medicare Other | Admitting: Family Medicine

## 2017-12-01 ENCOUNTER — Encounter: Payer: Self-pay | Admitting: Family Medicine

## 2017-12-01 VITALS — BP 135/74 | HR 69 | Temp 97.6°F | Ht 60.0 in | Wt 139.0 lb

## 2017-12-01 DIAGNOSIS — J0100 Acute maxillary sinusitis, unspecified: Secondary | ICD-10-CM | POA: Diagnosis not present

## 2017-12-01 DIAGNOSIS — J45901 Unspecified asthma with (acute) exacerbation: Secondary | ICD-10-CM | POA: Diagnosis not present

## 2017-12-01 MED ORDER — PREDNISONE 20 MG PO TABS: ORAL_TABLET | ORAL | 0 refills | Status: DC

## 2017-12-01 MED ORDER — FLUTICASONE PROPIONATE 50 MCG/ACT NA SUSP: 1.0000 | Freq: Two times a day (BID) | NASAL | 6 refills | Status: DC | PRN

## 2017-12-01 NOTE — Progress Notes (Signed)
BP 135/74   Pulse 69   Temp 97.6 F (36.4 C) (Oral)   Ht 5' (1.524 m)   Wt 139 lb (63 kg)   SpO2 97%   BMI 27.15 kg/m    Subjective:    Patient ID: [name], female    DOB: 08-Feb-1941, 77 y.o.   MRN: [id number]  HPI: [name] is a 77 y.o. female presenting on 12/01/2017 for Runny nose, cough (x 3 days, taking Mucinex Max)   HPI Cough and congestion and wheeze Patient has been having cough and congestion and wheeze for almost 3 days.  She did come in yesterday and talked to the triage nurse who told her to start Mucinex max and she did take it last night but she has not noticed a difference.  She says she has a history of asthma when she was younger and has not had issues with it for quite some time and is coming in before she could have possible issues.  She denies any shortness of breath but does have a wheeze sometimes.  Her cough is been productive.  She has had some postnasal drainage and sinus pressure and congestion that is been mostly clear drainage.  She says her husband was ill with a similar sickness last week and she is coming in because she does not want to get as sick as he was.  Relevant past medical, surgical, family and social history reviewed and updated as indicated. Interim medical history since our last visit reviewed. Allergies and medications reviewed and updated.  Review of Systems  Constitutional: Negative for chills and fever.  HENT: Positive for congestion, postnasal drip, rhinorrhea, sinus pressure, sneezing and sore throat. Negative for ear discharge and ear pain.   Eyes: Negative for visual disturbance.  Respiratory: Positive for cough and wheezing. Negative for chest tightness and shortness of breath.   Cardiovascular: Negative for chest pain and leg swelling.  Musculoskeletal: Negative for back pain and gait problem.  Skin: Negative for rash.  Neurological: Negative for light-headedness and headaches.  Psychiatric/Behavioral: Negative for  agitation and behavioral problems.  All other systems reviewed and are negative.   Per HPI unless specifically indicated above        Objective:    BP 135/74   Pulse 69   Temp 97.6 F (36.4 C) (Oral)   Ht 5' (1.524 m)   Wt 139 lb (63 kg)   SpO2 97%   BMI 27.15 kg/m   Wt Readings from Last 3 Encounters:  12/01/17 139 lb (63 kg)  11/18/17 136 lb 9.6 oz (62 kg)  05/27/17 138 lb 6.4 oz (62.8 kg)    Physical Exam  Constitutional: She is oriented to person, place, and time. She appears well-developed and well-nourished. No distress.  HENT:  Right Ear: Tympanic membrane, external ear and ear canal normal.  Left Ear: Tympanic membrane, external ear and ear canal normal.  Nose: Mucosal edema and rhinorrhea present. No epistaxis. Right sinus exhibits maxillary sinus tenderness. Right sinus exhibits no frontal sinus tenderness. Left sinus exhibits maxillary sinus tenderness. Left sinus exhibits no frontal sinus tenderness.  Mouth/Throat: Uvula is midline and mucous membranes are normal. Posterior oropharyngeal edema present. No oropharyngeal exudate, posterior oropharyngeal erythema or tonsillar abscesses.  Eyes: Conjunctivae and EOM are normal.  Cardiovascular: Normal rate, regular rhythm, normal heart sounds and intact distal pulses.  No murmur heard. Pulmonary/Chest: Effort normal and breath sounds normal. No respiratory distress. She has no wheezes.  Musculoskeletal: Normal range  of motion. She exhibits no edema or tenderness.  Neurological: She is alert and oriented to person, place, and time. Coordination normal.  Skin: Skin is warm and dry. No rash noted. She is not diaphoretic.  Psychiatric: She has a normal mood and affect. Her behavior is normal.  Vitals reviewed.       Assessment & Plan:   Problem List Items Addressed This Visit    None    Visit Diagnoses    Acute non-recurrent maxillary sinusitis    -  Primary   Relevant Medications   fluticasone (FLONASE) 50  MCG/ACT nasal spray   predniSONE (DELTASONE) 20 MG tablet   Mild asthma exacerbation       Relevant Medications   predniSONE (DELTASONE) 20 MG tablet      Follow up plan: Return if symptoms worsen or fail to improve.  Counseling provided for all of the vaccine components No orders of the defined types were placed in this encounter.   Arville CareJoshua Jaliel Deavers, MD [facility] 12/01/2017, 9:31 AM

## 2017-12-27 DIAGNOSIS — H6121 Impacted cerumen, right ear: Secondary | ICD-10-CM | POA: Diagnosis not present

## 2017-12-27 DIAGNOSIS — H73822 Atrophic nonflaccid tympanic membrane, left ear: Secondary | ICD-10-CM | POA: Diagnosis not present

## 2017-12-27 DIAGNOSIS — H903 Sensorineural hearing loss, bilateral: Secondary | ICD-10-CM | POA: Diagnosis not present

## 2018-01-25 DIAGNOSIS — Z1231 Encounter for screening mammogram for malignant neoplasm of breast: Secondary | ICD-10-CM | POA: Diagnosis not present

## 2018-01-25 DIAGNOSIS — Z803 Family history of malignant neoplasm of breast: Secondary | ICD-10-CM | POA: Diagnosis not present

## 2018-02-15 ENCOUNTER — Other Ambulatory Visit: Payer: Self-pay | Admitting: Family

## 2018-02-15 ENCOUNTER — Other Ambulatory Visit: Payer: Medicare Other

## 2018-02-15 DIAGNOSIS — E785 Hyperlipidemia, unspecified: Secondary | ICD-10-CM

## 2018-02-15 DIAGNOSIS — E039 Hypothyroidism, unspecified: Secondary | ICD-10-CM

## 2018-02-15 DIAGNOSIS — M810 Age-related osteoporosis without current pathological fracture: Secondary | ICD-10-CM

## 2018-02-15 DIAGNOSIS — J8410 Pulmonary fibrosis, unspecified: Secondary | ICD-10-CM

## 2018-02-15 DIAGNOSIS — E559 Vitamin D deficiency, unspecified: Secondary | ICD-10-CM | POA: Diagnosis not present

## 2018-02-15 DIAGNOSIS — J4540 Moderate persistent asthma, uncomplicated: Secondary | ICD-10-CM

## 2018-02-15 DIAGNOSIS — J301 Allergic rhinitis due to pollen: Secondary | ICD-10-CM

## 2018-02-16 LAB — CMP14+EGFR
A/G RATIO: 2.1 (ref 1.2–2.2)
ALK PHOS: 61 IU/L (ref 39–117)
ALT: 19 IU/L (ref 0–32)
AST: 18 IU/L (ref 0–40)
Albumin: 4.4 g/dL (ref 3.5–4.8)
BUN/Creatinine Ratio: 21 (ref 12–28)
BUN: 15 mg/dL (ref 8–27)
Bilirubin Total: 0.3 mg/dL (ref 0.0–1.2)
CO2: 23 mmol/L (ref 20–29)
Calcium: 9.8 mg/dL (ref 8.7–10.3)
Chloride: 106 mmol/L (ref 96–106)
Creatinine, Ser: 0.73 mg/dL (ref 0.57–1.00)
GFR calc Af Amer: 93 mL/min/{1.73_m2} (ref >59)
GFR, EST NON AFRICAN AMERICAN: 81 mL/min/{1.73_m2} (ref >59)
GLOBULIN, TOTAL: 2.1 g/dL (ref 1.5–4.5)
Glucose: 85 mg/dL (ref 65–99)
POTASSIUM: 4.2 mmol/L (ref 3.5–5.2)
SODIUM: 144 mmol/L (ref 134–144)
Total Protein: 6.5 g/dL (ref 6.0–8.5)

## 2018-02-16 LAB — LIPID PANEL
CHOL/HDL RATIO: 3.6 ratio (ref 0.0–4.4)
CHOLESTEROL TOTAL: 166 mg/dL (ref 100–199)
HDL: 46 mg/dL (ref >39)
LDL CALC: 94 mg/dL (ref 0–99)
TRIGLYCERIDES: 128 mg/dL (ref 0–149)
VLDL CHOLESTEROL CAL: 26 mg/dL (ref 5–40)

## 2018-02-16 LAB — VITAMIN D 25 HYDROXY (VIT D DEFICIENCY, FRACTURES): Vit D, 25-Hydroxy: 81.6 ng/mL (ref 30.0–100.0)

## 2018-02-16 LAB — TSH: TSH: 1.19 u[IU]/mL (ref 0.450–4.500)

## 2018-02-22 ENCOUNTER — Encounter: Payer: Self-pay | Admitting: Family

## 2018-02-22 ENCOUNTER — Ambulatory Visit (INDEPENDENT_AMBULATORY_CARE_PROVIDER_SITE_OTHER): Payer: Medicare Other | Admitting: Family

## 2018-02-22 VITALS — BP 138/73 | HR 66 | Temp 97.9°F | Ht 60.0 in | Wt 136.0 lb

## 2018-02-22 DIAGNOSIS — E663 Overweight: Secondary | ICD-10-CM

## 2018-02-22 DIAGNOSIS — J301 Allergic rhinitis due to pollen: Secondary | ICD-10-CM | POA: Diagnosis not present

## 2018-02-22 DIAGNOSIS — E039 Hypothyroidism, unspecified: Secondary | ICD-10-CM

## 2018-02-22 DIAGNOSIS — E559 Vitamin D deficiency, unspecified: Secondary | ICD-10-CM

## 2018-02-22 DIAGNOSIS — E785 Hyperlipidemia, unspecified: Secondary | ICD-10-CM

## 2018-02-22 DIAGNOSIS — J4540 Moderate persistent asthma, uncomplicated: Secondary | ICD-10-CM

## 2018-02-22 NOTE — Patient Instructions (Signed)

## 2018-02-22 NOTE — Progress Notes (Signed)
   Subjective:    Patient ID: [name], female    DOB: 1941/07/25, 77 y.o.   MRN: [id number]  PT presents to the office today for chronic follow up.  Thyroid Problem  Presents for follow-up visit. Symptoms include fatigue. Patient reports no constipation, diaphoresis, diarrhea or dry skin. The symptoms have been stable. Her past medical history is significant for hyperlipidemia.  Hyperlipidemia  This is a chronic problem. The current episode started more than 1 year ago. The problem is controlled. Recent lipid tests were reviewed and are normal. Current antihyperlipidemic treatment includes statins and diet change. The current treatment provides moderate improvement of lipids. Risk factors for coronary artery disease include dyslipidemia.  Asthma  There is no cough, difficulty breathing or wheezing. This is a chronic problem. The current episode started more than 1 year ago. The problem has been resolved. Her past medical history is significant for asthma.      Review of Systems  Constitutional: Positive for fatigue. Negative for diaphoresis.  Respiratory: Negative for cough and wheezing.   Gastrointestinal: Negative for constipation and diarrhea.  All other systems reviewed and are negative.      Objective:   Physical Exam  Constitutional: She appears well-developed and well-nourished. No distress.  HENT:  Head: Normocephalic and atraumatic.  Right Ear: External ear normal.  Left Ear: External ear normal.  Mouth/Throat: Oropharynx is clear and moist.  Eyes: Pupils are equal, round, and reactive to light.  Neck: Normal range of motion. Neck supple. No thyromegaly present.  Cardiovascular: Normal rate, regular rhythm, normal heart sounds and intact distal pulses.  No murmur heard. Pulmonary/Chest: Effort normal and breath sounds normal. No respiratory distress. She has no wheezes.  Abdominal: Soft. Bowel sounds are normal. She exhibits no distension. There is no tenderness.    Musculoskeletal: Normal range of motion. She exhibits no edema or tenderness.  Neurological: She is alert. She has normal reflexes.  Skin: Skin is warm and dry.  Psychiatric: She has a normal mood and affect. Her behavior is normal. Judgment and thought content normal.  Vitals reviewed.     BP 138/73   Temp 97.9 F (36.6 C) (Oral)   Ht 5' (1.524 m)   Wt 136 lb (61.7 kg)   BMI 26.56 kg/m      Assessment & Plan:  1. Moderate persistent asthma in adult without complication  2. Hypothyroidism, unspecified type  3. Hyperlipidemia, unspecified hyperlipidemia type  4. Overweight (BMI 25.0-29.9)  5. Vitamin D deficiency  6. Allergic rhinitis due to pollen, unspecified seasonality   Continue all meds Labs discussed Health Maintenance reviewed- Will get mammogram faxed and scanned into EPIC Diet and exercise encouraged RTO 6 months   Jannifer Rodney, FNP

## 2018-03-06 ENCOUNTER — Other Ambulatory Visit: Payer: Self-pay

## 2018-03-06 ENCOUNTER — Encounter (HOSPITAL_COMMUNITY): Payer: Self-pay | Admitting: Emergency Medicine

## 2018-03-06 ENCOUNTER — Observation Stay (HOSPITAL_COMMUNITY)
Admission: EM | Admit: 2018-03-06 | Discharge: 2018-03-07 | Disposition: A | Discharge status: Home / Self Care | Payer: Medicare Other | Attending: General Surgery | Admitting: General Surgery

## 2018-03-06 ENCOUNTER — Emergency Department (HOSPITAL_COMMUNITY): Payer: Medicare Other

## 2018-03-06 DIAGNOSIS — R52 Pain, unspecified: Secondary | ICD-10-CM | POA: Diagnosis not present

## 2018-03-06 DIAGNOSIS — R1031 Right lower quadrant pain: Secondary | ICD-10-CM | POA: Diagnosis not present

## 2018-03-06 DIAGNOSIS — Z79899 Other long term (current) drug therapy: Secondary | ICD-10-CM | POA: Diagnosis not present

## 2018-03-06 DIAGNOSIS — J45909 Unspecified asthma, uncomplicated: Secondary | ICD-10-CM | POA: Diagnosis not present

## 2018-03-06 DIAGNOSIS — E039 Hypothyroidism, unspecified: Secondary | ICD-10-CM | POA: Insufficient documentation

## 2018-03-06 DIAGNOSIS — R1110 Vomiting, unspecified: Secondary | ICD-10-CM | POA: Insufficient documentation

## 2018-03-06 DIAGNOSIS — R109 Unspecified abdominal pain: Secondary | ICD-10-CM | POA: Diagnosis not present

## 2018-03-06 DIAGNOSIS — K5730 Diverticulosis of large intestine without perforation or abscess without bleeding: Secondary | ICD-10-CM | POA: Diagnosis not present

## 2018-03-06 DIAGNOSIS — Z7982 Long term (current) use of aspirin: Secondary | ICD-10-CM | POA: Diagnosis not present

## 2018-03-06 LAB — COMPREHENSIVE METABOLIC PANEL
ALBUMIN: 4.2 g/dL (ref 3.5–5.0)
ALT: 19 U/L (ref 14–54)
ANION GAP: 8 (ref 5–15)
AST: 18 U/L (ref 15–41)
Alkaline Phosphatase: 54 U/L (ref 38–126)
BUN: 17 mg/dL (ref 6–20)
CALCIUM: 10 mg/dL (ref 8.9–10.3)
CO2: 25 mmol/L (ref 22–32)
Chloride: 108 mmol/L (ref 101–111)
Creatinine, Ser: 0.66 mg/dL (ref 0.44–1.00)
GFR calc non Af Amer: >60 mL/min (ref >60)
Glucose, Bld: 120 mg/dL — ABNORMAL HIGH (ref 65–99)
POTASSIUM: 4.1 mmol/L (ref 3.5–5.1)
SODIUM: 141 mmol/L (ref 135–145)
TOTAL PROTEIN: 7.1 g/dL (ref 6.5–8.1)
Total Bilirubin: 0.6 mg/dL (ref 0.3–1.2)

## 2018-03-06 LAB — CBC WITH DIFFERENTIAL/PLATELET
BASOS PCT: 0 %
Basophils Absolute: 0 10*3/uL (ref 0.0–0.1)
EOS ABS: 0 10*3/uL (ref 0.0–0.7)
EOS PCT: 0 %
HCT: 39.4 % (ref 36.0–46.0)
Hemoglobin: 13.1 g/dL (ref 12.0–15.0)
LYMPHS ABS: 1.2 10*3/uL (ref 0.7–4.0)
Lymphocytes Relative: 15 %
MCH: 30.2 pg (ref 26.0–34.0)
MCHC: 33.2 g/dL (ref 30.0–36.0)
MCV: 90.8 fL (ref 78.0–100.0)
MONO ABS: 0.4 10*3/uL (ref 0.1–1.0)
MONOS PCT: 5 %
Neutro Abs: 6.1 10*3/uL (ref 1.7–7.7)
Neutrophils Relative %: 80 %
PLATELETS: 218 10*3/uL (ref 150–400)
RBC: 4.34 MIL/uL (ref 3.87–5.11)
RDW: 13.2 % (ref 11.5–15.5)
WBC: 7.8 10*3/uL (ref 4.0–10.5)

## 2018-03-06 LAB — URINALYSIS, ROUTINE W REFLEX MICROSCOPIC
BILIRUBIN URINE: NEGATIVE
Bacteria, UA: NONE SEEN
GLUCOSE, UA: NEGATIVE mg/dL
HGB URINE DIPSTICK: NEGATIVE
Ketones, ur: NEGATIVE mg/dL
Nitrite: NEGATIVE
Protein, ur: NEGATIVE mg/dL
SPECIFIC GRAVITY, URINE: 1.008 (ref 1.005–1.030)
pH: 6 (ref 5.0–8.0)

## 2018-03-06 MED ORDER — MORPHINE SULFATE (PF) 2 MG/ML IV SOLN: 2.0000 mg | Freq: Once | INTRAVENOUS | Status: DC

## 2018-03-06 MED ORDER — DIPHENHYDRAMINE HCL 12.5 MG/5ML PO ELIX: 12.5000 mg | ORAL_SOLUTION | Freq: Four times a day (QID) | ORAL | Status: DC | PRN

## 2018-03-06 MED ORDER — ONDANSETRON HCL 4 MG/2ML IJ SOLN: 4.0000 mg | Freq: Four times a day (QID) | INTRAMUSCULAR | Status: DC | PRN

## 2018-03-06 MED ORDER — MORPHINE SULFATE (PF) 2 MG/ML IV SOLN
1.0000 mg | INTRAVENOUS | Status: DC | PRN
Start: 2018-03-06 — End: 2018-03-07
  Administered 2018-03-07: 1 mg via INTRAVENOUS
  Filled 2018-03-06: qty 1

## 2018-03-06 MED ORDER — LACTATED RINGERS IV SOLN
INTRAVENOUS | Status: DC
  Administered 2018-03-07: 01:00:00 via INTRAVENOUS

## 2018-03-06 MED ORDER — DIPHENHYDRAMINE HCL 50 MG/ML IJ SOLN: 12.5000 mg | Freq: Four times a day (QID) | INTRAMUSCULAR | Status: DC | PRN

## 2018-03-06 MED ORDER — METOPROLOL TARTRATE 5 MG/5ML IV SOLN: 5.0000 mg | Freq: Four times a day (QID) | INTRAVENOUS | Status: DC | PRN

## 2018-03-06 MED ORDER — ALBUTEROL SULFATE (2.5 MG/3ML) 0.083% IN NEBU: 3.0000 mL | INHALATION_SOLUTION | Freq: Four times a day (QID) | RESPIRATORY_TRACT | Status: DC | PRN

## 2018-03-06 MED ORDER — HYDROCODONE-ACETAMINOPHEN 5-325 MG PO TABS: 1.0000 | ORAL_TABLET | ORAL | Status: DC | PRN

## 2018-03-06 MED ORDER — SODIUM CHLORIDE 0.9 % IV BOLUS
500.0000 mL | Freq: Once | INTRAVENOUS | Status: AC
  Administered 2018-03-06: 500 mL via INTRAVENOUS

## 2018-03-06 MED ORDER — ONDANSETRON 4 MG PO TBDP: 4.0000 mg | ORAL_TABLET | Freq: Four times a day (QID) | ORAL | Status: DC | PRN

## 2018-03-06 MED ORDER — KETOROLAC TROMETHAMINE 30 MG/ML IJ SOLN
15.0000 mg | Freq: Once | INTRAMUSCULAR | Status: AC
  Administered 2018-03-06: 15 mg via INTRAVENOUS
  Filled 2018-03-06: qty 1

## 2018-03-06 MED ORDER — BUDESONIDE 0.25 MG/2ML IN SUSP
2.0000 mL | Freq: Two times a day (BID) | RESPIRATORY_TRACT | Status: DC
  Administered 2018-03-07: 0.25 mg via RESPIRATORY_TRACT
  Filled 2018-03-06: qty 2

## 2018-03-06 MED ORDER — LEVOTHYROXINE SODIUM 88 MCG PO TABS
88.0000 ug | ORAL_TABLET | Freq: Every day | ORAL | Status: DC
  Administered 2018-03-07: 88 ug via ORAL
  Filled 2018-03-06: qty 1

## 2018-03-06 MED ORDER — IOPAMIDOL (ISOVUE-300) INJECTION 61%
100.0000 mL | Freq: Once | INTRAVENOUS | Status: AC | PRN
  Administered 2018-03-06: 100 mL via INTRAVENOUS

## 2018-03-06 MED ORDER — MECLIZINE HCL 12.5 MG PO TABS: 25.0000 mg | ORAL_TABLET | Freq: Three times a day (TID) | ORAL | Status: DC | PRN

## 2018-03-06 NOTE — Progress Notes (Signed)
Patient with RLQ pain. Labs wnl, CT with distended appendix but no inflammation. Very unlikely to be appendicitis. Patient not comfortable with going home and coming back if worsens.  Will admit for observation overnight. Repeat CBC in the AM.  NPO. IVF. PRN for pain. NO Antibiotics.   Algis Greenhouse, MD [facility] [address] [place], [place] [address] [phone] (office)

## 2018-03-06 NOTE — ED Triage Notes (Signed)
Pt states having abd pain in right groin for a few days now pain going into righ side/flank area.

## 2018-03-06 NOTE — ED Provider Notes (Signed)
[facility] EMERGENCY DEPARTMENT Provider Note   CSN: [id number] Arrival date & time: 03/06/18  1726     History   Chief Complaint Chief Complaint  Patient presents with  . Flank Pain    HPI [name] is a 77 y.o. female.  HPI  77 year old female presents today complaining of right flank to right lower quadrant pain that has been present for 1 to 2 days.  She describes it as waxing and waning initially but now is constant.  It is sharp and severe.  She is not clear that there are any increasing or decreasing factors.  She had an episode of vomiting today that occurred with the pain.  She has otherwise not had nausea, vomiting, diarrhea, or change in appetite.  She states that food intake does not increase this.  She denies UTI symptoms such as increased urination, pain with urination, or discolored urination.  She denies having any similar symptoms in the past.  No fever or chills have been noted  Past Medical History:  Diagnosis Date  . Asthma   . Gallstone pancreatitis   . Hypothyroidism   . Osteoporosis   . Postinflammatory pulmonary fibrosis (HCC)   . Vertigo     Patient Active Problem List   Diagnosis Date Noted  . Overweight (BMI 25.0-29.9) 08/14/2016  . Bronchitis with bronchospasm 04/10/2016  . Osteoporosis 12/02/2015  . Vitamin D deficiency 11/21/2015  . HLD (hyperlipidemia) 03/28/2013  . Allergic rhinitis 01/08/2010  . Hypothyroidism 07/03/2007  . Moderate persistent asthma in adult without complication 07/03/2007  . FIBROSIS, POSTINFLAMMATORY PULMONARY 07/03/2007    Past Surgical History:  Procedure Laterality Date  . CATARACT EXTRACTION W/PHACO  10/01/2011   Procedure: CATARACT EXTRACTION PHACO AND INTRAOCULAR LENS PLACEMENT (IOC);  Surgeon: Gemma Payor;  Location: [facility];  Service: Ophthalmology;  Laterality: Left;  CDE:13.74  . CATARACT EXTRACTION W/PHACO  10/22/2011   Procedure: CATARACT EXTRACTION PHACO AND INTRAOCULAR LENS PLACEMENT (IOC);   Surgeon: Gemma Payor;  Location: [facility];  Service: Ophthalmology;  Laterality: Right;  CDE:9.98  . CHOLECYSTECTOMY  2010  . TUBAL LIGATION       OB History   None      Home Medications    Prior to Admission medications   Medication Sig Start Date End Date Taking? Authorizing Provider  albuterol (PROAIR HFA) 108 (90 Base) MCG/ACT inhaler Inhale 2 puffs into the lungs every 6 (six) hours as needed for wheezing or shortness of breath. 11/18/17   Mannam, Praveen, MD  aspirin EC 81 MG tablet Take 81 mg by mouth daily.      [provider]  Cholecalciferol (VITAMIN D-3) 1000 UNITS CAPS Take 5,000 Units by mouth daily.     [provider]  fluticasone (FLONASE) 50 MCG/ACT nasal spray Place 1 spray into both nostrils 2 (two) times daily as needed for allergies or rhinitis. 12/01/17   Dettinger, Elige Radon, MD  Fluticasone Furoate (ARNUITY ELLIPTA) 100 MCG/ACT AEPB Inhale 1 puff into the lungs daily. 11/18/17   Mannam, Colbert Coyer, MD  levothyroxine (SYNTHROID, LEVOTHROID) 88 MCG tablet TAKE ONE TABLET BY MOUTH ONCE DAILY 09/27/17   Jannifer Rodney A, FNP  meclizine (ANTIVERT) 25 MG tablet Take 1 tablet (25 mg total) by mouth 3 (three) times daily as needed for dizziness. 01/03/15   Junie Spencer, FNP  Multiple Vitamins-Minerals (CENTRUM SILVER PO) Take 1 tablet by mouth daily. Reported on 12/12/2015    [provider]    Family History Family History  Problem Relation Age of Onset  . Heart failure Mother   . Emphysema Mother   . Other Father        blood clot in brain  . Breast cancer Sister   . Cancer Sister        breast  . Breast cancer Sister   . Cancer Sister        breast  . Asthma Sister   . Cancer Brother        lung  . Anesthesia problems Neg Hx   . Hypotension Neg Hx   . Malignant hyperthermia Neg Hx   . Pseudochol deficiency Neg Hx     Social History Social History   Tobacco Use  . Smoking status: Never Smoker  . Smokeless tobacco: Never Used    Substance Use Topics  . Alcohol use: No  . Drug use: No     Allergies   Sulfonamide derivatives   Review of Systems Review of Systems  All other systems reviewed and are negative.    Physical Exam Updated Vital Signs BP (!) 147/93 (BP Location: Right Arm)   Pulse 66   Temp 97.7 F (36.5 C) (Oral)   Resp 18   Ht 1.524 m (5')   Wt 61.7 kg (136 lb)   SpO2 98%   BMI 26.56 kg/m   Physical Exam  Constitutional: She is oriented to person, place, and time. She appears well-developed and well-nourished.  HENT:  Head: Normocephalic and atraumatic.  Right Ear: External ear normal.  Left Ear: External ear normal.  Nose: Nose normal.  Mouth/Throat: Oropharynx is clear and moist.  Eyes: Pupils are equal, round, and reactive to light. EOM are normal.  Neck: Normal range of motion. Neck supple.  Cardiovascular: Normal rate and regular rhythm.  Pulmonary/Chest: Effort normal and breath sounds normal.  Abdominal: Soft. Bowel sounds are normal.  Mild tenderness to palpation with deep palpation of right lower quadrant  Musculoskeletal: Normal range of motion.  Neurological: She is alert and oriented to person, place, and time.  Skin: Skin is warm and dry. Capillary refill takes less than 2 seconds.  Psychiatric: She has a normal mood and affect.  Nursing note and vitals reviewed.    ED Treatments / Results  Labs (all labs ordered are listed, but only abnormal results are displayed) Labs Reviewed  URINALYSIS, ROUTINE W REFLEX MICROSCOPIC  CBC WITH DIFFERENTIAL/PLATELET  COMPREHENSIVE METABOLIC PANEL    EKG None  Radiology Ct Abdomen Pelvis W Contrast  Result Date: 03/06/2018 CLINICAL DATA:  Right-sided abdominal pain EXAM: CT ABDOMEN AND PELVIS WITH CONTRAST TECHNIQUE: Multidetector CT imaging of the abdomen and pelvis was performed using the standard protocol following bolus administration of intravenous contrast. CONTRAST:  ISOVUE-300 IOPAMIDOL (ISOVUE-300)  INJECTION 61% COMPARISON:  CT KUB 03/06/2018 FINDINGS: Lower chest: Lung bases demonstrate no acute consolidation or effusion. Heart size borderline enlarged. Hepatobiliary: Status post cholecystectomy. Calcified liver granuloma. Prominent extrahepatic bile duct likely due to surgical change Pancreas: Unremarkable. No pancreatic ductal dilatation or surrounding inflammatory changes. Spleen: Normal in size without focal abnormality. Adrenals/Urinary Tract: Adrenal glands are within normal limits. Probable scarring in the upper pole right kidney. Cysts in the left kidney. Bilateral subcentimeter hypodense renal lesions too small to further characterize. Bladder within normal limits. Stomach/Bowel: Stomach is nonenlarged. No dilated small bowel. Slightly dilated fluid-filled appendix measuring up to 9 mm. No stones. No significant surrounding inflammation. No extraluminal gas or fluid collections. No significant mucosal enhancement. Vascular/Lymphatic: Mild aortic atherosclerosis. No  aneurysmal dilatation. No significantly enlarged lymph nodes. Reproductive: Uterus and bilateral adnexa are unremarkable. Other: Negative for free air or free fluid. Musculoskeletal: Degenerative changes of the spine. No acute or suspicious abnormality IMPRESSION: 1. Slightly dilated fluid-filled appendix but without surrounding soft tissue inflammation or other CT features to strongly suggest acute appendicitis. 2. There are otherwise no acute intra-abdominal or pelvic abnormalities visualized. Electronically Signed   By: Jasmine Pang M.D.   On: 03/06/2018 22:48   Ct Renal Stone Study  Addendum Date: 03/06/2018   ADDENDUM REPORT: 03/06/2018 20:39 ADDENDUM: Upon re-review of the images, appendix is identified and is borderline enlarged measuring up to 8-9 mm in diameter with normal size of the tip. No stones. No definitive surrounding inflammation. Repeat CT with oral and intravenous contrast could prove helpful if clinical picture  suggests acute appendicitis. Case was discussed with Dr. Rosalia Hammers. Electronically Signed   By: Jasmine Pang M.D.   On: 03/06/2018 20:39   Result Date: 03/06/2018 CLINICAL DATA:  Flank pain EXAM: CT ABDOMEN AND PELVIS WITHOUT CONTRAST TECHNIQUE: Multidetector CT imaging of the abdomen and pelvis was performed following the standard protocol without IV contrast. COMPARISON:  Ultrasound abdomen 03/29/2009 FINDINGS: Lower chest: Lung bases demonstrate no acute consolidation or effusion. Heart size upper normal. Small hiatal hernia. Hepatobiliary: Calcified liver granuloma. Surgical absence of the gallbladder. No biliary dilatation. Pancreas: Unremarkable. No pancreatic ductal dilatation or surrounding inflammatory changes. Spleen: Normal in size without focal abnormality. Adrenals/Urinary Tract: Adrenal glands are within normal limits. No hydronephrosis. Probable cyst midpole left kidney. Bladder unremarkable Stomach/Bowel: Stomach is within normal limits. Appendix not well seen but no right lower quadrant inflammation. Sigmoid colon diverticula without acute inflammation. No evidence of bowel wall thickening, distention, or inflammatory changes. Vascular/Lymphatic: Mild to moderate aortic atherosclerosis. No aneurysmal dilatation. No significantly enlarged lymph nodes. Reproductive: Uterus and bilateral adnexa are unremarkable. Other: Negative for free air or free fluid. Musculoskeletal: Degenerative changes of the spine. No acute or suspicious abnormality IMPRESSION: 1. Negative for nephrolithiasis, hydronephrosis or ureteral stone 2. Sigmoid colon diverticular disease without acute inflammation. Electronically Signed: By: Jasmine Pang M.D. On: 03/06/2018 19:44    Procedures Procedures (including critical care time)  Medications Ordered in ED Medications  sodium chloride 0.9 % bolus 500 mL (has no administration in time range)  ketorolac (TORADOL) 30 MG/ML injection 15 mg (has no administration in time range)       Initial Impression / Assessment and Plan / ED Course  I have reviewed the triage vital signs and the nursing notes.  Pertinent labs & imaging results that were available during my care of the patient were reviewed by me and considered in my medical decision making (see chart for details).   Discussed ct results with Dr. Jake Samples- appendix visualized on reexam at about 8 mm- no inflammation and no stones noted.   This is a 77 year old lady who presented today with right lower quadrant right flank pain.  Work-up here shows no definitive cause.  She had CT for kidney stones that was negative.  However, the appendix was not clearly defined.  She is re-CT with contrast and appendix is 9 mm without surrounding inflammation.  However there is some fluid in this.  On reexam the patient is nontender and is not having pain.  Asked with Dr. Henreitta Leber, on-call for general surgery.  She will admit patient overnight and reevaluate in morning.  This is been discussed in depth with patient and her husband and they are in  agreement with plan and voiced understanding.  Final Clinical Impressions(s) / ED Diagnoses   Final diagnoses:  RLQ abdominal pain    ED Discharge Orders    None       Margarita Grizzle, MD 03/06/18 2312

## 2018-03-07 DIAGNOSIS — R1031 Right lower quadrant pain: Secondary | ICD-10-CM

## 2018-03-07 LAB — CBC WITH DIFFERENTIAL/PLATELET
BASOS ABS: 0 10*3/uL (ref 0.0–0.1)
BASOS PCT: 0 %
Eosinophils Absolute: 0.1 10*3/uL (ref 0.0–0.7)
Eosinophils Relative: 1 %
HEMATOCRIT: 38.3 % (ref 36.0–46.0)
HEMOGLOBIN: 12.7 g/dL (ref 12.0–15.0)
LYMPHS PCT: 26 %
Lymphs Abs: 1.7 10*3/uL (ref 0.7–4.0)
MCH: 30.5 pg (ref 26.0–34.0)
MCHC: 33.2 g/dL (ref 30.0–36.0)
MCV: 92.1 fL (ref 78.0–100.0)
MONOS PCT: 9 %
Monocytes Absolute: 0.6 10*3/uL (ref 0.1–1.0)
NEUTROS ABS: 4.1 10*3/uL (ref 1.7–7.7)
NEUTROS PCT: 64 %
Platelets: 214 10*3/uL (ref 150–400)
RBC: 4.16 MIL/uL (ref 3.87–5.11)
RDW: 13.6 % (ref 11.5–15.5)
WBC: 6.5 10*3/uL (ref 4.0–10.5)

## 2018-03-07 NOTE — Discharge Summary (Signed)
Physician Discharge Summary  Patient ID: [name] MRN: [id number] DOB/AGE: 1941-05-14 77 y.o.  Admit date: 03/06/2018 Discharge date: 03/07/2018  Admission Diagnoses:  Discharge Diagnoses:  Active Problems:   Abdominal pain   RLQ abdominal pain   Discharged Condition: good  Hospital Course: [name] is a 77 yo with RLQ pain likely related to some muscle strain that was brought in for rule out appendicitis after presenting to the Ed with RLQ pain. She had no reported fevers, and no leukocytosis and CT that demonstrated a fluid filled appendix but no inflammation. She had repeat labs and they remained normal, and no fevers during her observation. Based on her description of the pain and the presentation, it sounds like this is more related to some groin pain and could be from overuse/ lifting 2 heavy jugs.  She was able to tolerate a diet and had no further pain, and was sent home with follow up with her PCP.   Consults: None  Significant Diagnostic Studies: CT ap- fluid filled, dilated appendix, not inflamed, no stranding; no inguinal hernias   Treatments: IV hydration  Discharge Exam: Blood pressure (!) 109/55, pulse (!) 57, temperature 97.9 F (36.6 C), temperature source Oral, resp. rate 16, height 5' (1.524 m), weight 120 lb 2.4 oz (54.5 kg), SpO2 98 %. General appearance: alert, cooperative and no distress Resp: normal work breathing GI: soft, non-tender; bowel sounds normal; no masses,  no organomegaly deep palpation, no pain in the RLQ, some pain over the right groin region  Disposition: Discharge disposition: 01-Home or Self Care       Discharge Instructions    Call MD for:  difficulty breathing, headache or visual disturbances   Complete by:  As directed    Call MD for:  extreme fatigue   Complete by:  As directed    Call MD for:  persistant dizziness or light-headedness   Complete by:  As directed    Call MD for:  persistant nausea and vomiting   Complete  by:  As directed    Call MD for:  redness, tenderness, or signs of infection (pain, swelling, redness, odor or green/yellow discharge around incision site)   Complete by:  As directed    Call MD for:  severe uncontrolled pain   Complete by:  As directed    Call MD for:  temperature >100.4   Complete by:  As directed    Diet - low sodium heart healthy   Complete by:  As directed    Increase activity slowly   Complete by:  As directed      Allergies as of 03/07/2018      Reactions   Sulfonamide Derivatives       Medication List    TAKE these medications   albuterol 108 (90 Base) MCG/ACT inhaler Commonly known as:  PROAIR HFA Inhale 2 puffs into the lungs every 6 (six) hours as needed for wheezing or shortness of breath.   aspirin EC 81 MG tablet Take 81 mg by mouth daily.   CENTRUM SILVER PO Take 1 tablet by mouth daily. Reported on 12/12/2015   Fluticasone Furoate 100 MCG/ACT Aepb Commonly known as:  ARNUITY ELLIPTA Inhale 1 puff into the lungs daily.   ibuprofen 200 MG tablet Commonly known as:  ADVIL,MOTRIN Take 400 mg by mouth every 6 (six) hours as needed for mild pain.   levothyroxine 88 MCG tablet Commonly known as:  SYNTHROID, LEVOTHROID TAKE ONE TABLET BY MOUTH ONCE DAILY  meclizine 25 MG tablet Commonly known as:  ANTIVERT Take 1 tablet (25 mg total) by mouth 3 (three) times daily as needed for dizziness.   Vitamin D-3 1000 units Caps Take 5,000 Units by mouth daily.      Follow-up Information    Junie Spencer, FNP Follow up in 1 week(s).   Specialty:  Family Medicine Contact information: [address] [place] [address] [phone]           Signed: Lucretia Roers 03/07/2018, 12:44 PM

## 2018-03-07 NOTE — Progress Notes (Signed)
Patient states understanding of discharge instructions.  

## 2018-03-07 NOTE — H&P (Signed)
[facility] History and Physical  Reason for Referral: RLQ pain  Referring Physician:  Dr. Elana Alm   Chief Complaint    Flank Pain      [name] is a 77 y.o. female.  HPI: [name] is a 77 yo with RLQ pain that started Friday. She had no complaints of any nausea/vomiting or fevers. She reported some history of decreased appetite and some weight loss but nothing that is recent in onset.  She had a colonoscopy years ago, and has not had a repeat.  She says she has daily BMs that are not hard and no blood in her stool.   She was seen in the ED and renal protocol CT was done that was negative. She then underwent a CT with contrast that demonstrated a fluid filled appendix but no stranding or inflammation and a normal leukocytosis.  She was still having pain and was worried about going home, so I observed her overnight to rule out appendicitis.   She has had no fevers or chills. Her WBC is normal this AM. She reports the same pain but it is more in the groin area and is worse with movement.  After discussing she did carry two milk jugs of water 2 weeks ago across her yard, but notes no other straining or excessive activity or injury.   Past Medical History:  Diagnosis Date  . Asthma   . Gallstone pancreatitis   . Hypothyroidism   . Osteoporosis   . Postinflammatory pulmonary fibrosis ([place])   . Vertigo     Past Surgical History:  Procedure Laterality Date  . CATARACT EXTRACTION W/PHACO  10/01/2011   Procedure: CATARACT EXTRACTION PHACO AND INTRAOCULAR LENS PLACEMENT (IOC);  Surgeon: Tonny Branch;  Location: [facility];  Service: Ophthalmology;  Laterality: Left;  CDE:13.74  . CATARACT EXTRACTION W/PHACO  10/22/2011   Procedure: CATARACT EXTRACTION PHACO AND INTRAOCULAR LENS PLACEMENT (IOC);  Surgeon: Tonny Branch;  Location: [facility];  Service: Ophthalmology;  Laterality: Right;  CDE:9.98  . CHOLECYSTECTOMY  2010  . TUBAL LIGATION      Family History  Problem Relation  Age of Onset  . Heart failure Mother   . Emphysema Mother   . Other Father        blood clot in brain  . Breast cancer Sister   . Cancer Sister        breast  . Breast cancer Sister   . Cancer Sister        breast  . Asthma Sister   . Cancer Brother        lung  . Anesthesia problems Neg Hx   . Hypotension Neg Hx   . Malignant hyperthermia Neg Hx   . Pseudochol deficiency Neg Hx     Social History   Tobacco Use  . Smoking status: Never Smoker  . Smokeless tobacco: Never Used  Substance Use Topics  . Alcohol use: No  . Drug use: No    Medications:  I have reviewed the patient's current medications. Prior to Admission:  Medications Prior to Admission  Medication Sig Dispense Refill Last Dose  . aspirin EC 81 MG tablet Take 81 mg by mouth daily.     03/06/2018 at Unknown time  . Cholecalciferol (VITAMIN D-3) 1000 UNITS CAPS Take 5,000 Units by mouth daily.    03/06/2018 at Unknown time  . Fluticasone Furoate (ARNUITY ELLIPTA) 100 MCG/ACT AEPB Inhale 1 puff into the lungs daily. 30 each 6 03/06/2018 at Unknown  time  . ibuprofen (ADVIL,MOTRIN) 200 MG tablet Take 400 mg by mouth every 6 (six) hours as needed for mild pain.   unknown  . levothyroxine (SYNTHROID, LEVOTHROID) 88 MCG tablet TAKE ONE TABLET BY MOUTH ONCE DAILY 90 tablet 2 03/05/2018 at Unknown time  . Multiple Vitamins-Minerals (CENTRUM SILVER PO) Take 1 tablet by mouth daily. Reported on 12/12/2015   03/06/2018 at Unknown time  . albuterol (PROAIR HFA) 108 (90 Base) MCG/ACT inhaler Inhale 2 puffs into the lungs every 6 (six) hours as needed for wheezing or shortness of breath. 18 each 1 unknown  . meclizine (ANTIVERT) 25 MG tablet Take 1 tablet (25 mg total) by mouth 3 (three) times daily as needed for dizziness. 90 tablet 3 unknown   Scheduled: . budesonide  2 mL Inhalation BID  . levothyroxine  88 mcg Oral QAC breakfast   Continuous: . lactated ringers 50 mL/hr at 03/07/18 0042   GPQ:DIYMEBRAX, diphenhydrAMINE  **OR** diphenhydrAMINE, HYDROcodone-acetaminophen, meclizine, metoprolol tartrate, morphine injection, ondansetron **OR** ondansetron (ZOFRAN) IV  Allergies Allergies  Allergen Reactions  . Sulfonamide Derivatives     ROS:  A comprehensive review of systems was negative except for: Gastrointestinal: positive for right groin pain that radiates into the RLQ, no nausea/vomiting, some decreased po   Blood pressure (!) 109/55, pulse (!) 57, temperature 97.9 F (36.6 C), temperature source Oral, resp. rate 16, height 5' (1.524 m), weight 120 lb 2.4 oz (54.5 kg), SpO2 98 %. Physical Exam  Constitutional: She is oriented to person, place, and time. She appears well-developed.  HENT:  Head: Normocephalic.  Eyes: Pupils are equal, round, and reactive to light. EOM are normal.  Neck: Normal range of motion. Neck supple.  Cardiovascular: Normal rate.  Pulmonary/Chest: Effort normal.  Abdominal: Soft. She exhibits no distension. There is no tenderness. No hernia.  Deep palpation to RLQ without any tenderness, right groin with some pain on palpation, no hernia in the inguinal region  Musculoskeletal: Normal range of motion. She exhibits no edema.  Neurological: She is alert and oriented to person, place, and time.  Skin: Skin is warm and dry.  Psychiatric: She has a normal mood and affect. Her behavior is normal. Judgment and thought content normal.  Vitals reviewed.   Results: Results for orders placed or performed during the hospital encounter of 03/06/18 (from the past 48 hour(s))  Urinalysis, Routine w reflex microscopic- may I&O cath if menses     Status: Abnormal   Collection Time: 03/06/18  7:15 PM  Result Value Ref Range   Color, Urine STRAW (A) YELLOW   APPearance CLEAR CLEAR   Specific Gravity, Urine 1.008 1.005 - 1.030   pH 6.0 5.0 - 8.0   Glucose, UA NEGATIVE NEGATIVE mg/dL   Hgb urine dipstick NEGATIVE NEGATIVE   Bilirubin Urine NEGATIVE NEGATIVE   Ketones, ur NEGATIVE  NEGATIVE mg/dL   Protein, ur NEGATIVE NEGATIVE mg/dL   Nitrite NEGATIVE NEGATIVE   Leukocytes, UA SMALL (A) NEGATIVE   RBC / HPF 0-5 0 - 5 RBC/hpf   WBC, UA 0-5 0 - 5 WBC/hpf   Bacteria, UA NONE SEEN NONE SEEN   Squamous Epithelial / LPF 0-5 0 - 5    Comment: Performed at [facility], [address], [place], [place] [address]  CBC with Differential/Platelet     Status: None   Collection Time: 03/06/18  7:15 PM  Result Value Ref Range   WBC 7.8 4.0 - 10.5 K/uL   RBC 4.34 3.87 - 5.11 MIL/uL  Hemoglobin 13.1 12.0 - 15.0 g/dL   HCT 39.4 36.0 - 46.0 %   MCV 90.8 78.0 - 100.0 fL   MCH 30.2 26.0 - 34.0 pg   MCHC 33.2 30.0 - 36.0 g/dL   RDW 13.2 11.5 - 15.5 %   Platelets 218 150 - 400 K/uL   Neutrophils Relative % 80 %   Neutro Abs 6.1 1.7 - 7.7 K/uL   Lymphocytes Relative 15 %   Lymphs Abs 1.2 0.7 - 4.0 K/uL   Monocytes Relative 5 %   Monocytes Absolute 0.4 0.1 - 1.0 K/uL   Eosinophils Relative 0 %   Eosinophils Absolute 0.0 0.0 - 0.7 K/uL   Basophils Relative 0 %   Basophils Absolute 0.0 0.0 - 0.1 K/uL    Comment: Performed at [facility], [address] [place], [place] [address]  Comprehensive metabolic panel     Status: Abnormal   Collection Time: 03/06/18  7:15 PM  Result Value Ref Range   Sodium 141 135 - 145 mmol/L   Potassium 4.1 3.5 - 5.1 mmol/L   Chloride 108 101 - 111 mmol/L   CO2 25 22 - 32 mmol/L   Glucose, Bld 120 (H) 65 - 99 mg/dL   BUN 17 6 - 20 mg/dL   Creatinine, Ser 0.66 0.44 - 1.00 mg/dL   Calcium 10.0 8.9 - 10.3 mg/dL   Total Protein 7.1 6.5 - 8.1 g/dL   Albumin 4.2 3.5 - 5.0 g/dL   AST 18 15 - 41 U/L   ALT 19 14 - 54 U/L   Alkaline Phosphatase 54 38 - 126 U/L   Total Bilirubin 0.6 0.3 - 1.2 mg/dL   GFR calc non Af Amer >60 >60 mL/min   GFR calc Af Amer >60 >60 mL/min    Comment: (NOTE) The eGFR has been calculated using the CKD EPI equation. This calculation has not been validated in all clinical situations. eGFR's persistently <60 mL/min  signify possible Chronic Kidney Disease.    Anion gap 8 5 - 15    Comment: Performed at [facility], [address] [place], [place] [address]  CBC with Differential/Platelet     Status: None   Collection Time: 03/07/18  5:12 AM  Result Value Ref Range   WBC 6.5 4.0 - 10.5 K/uL   RBC 4.16 3.87 - 5.11 MIL/uL   Hemoglobin 12.7 12.0 - 15.0 g/dL   HCT 38.3 36.0 - 46.0 %   MCV 92.1 78.0 - 100.0 fL   MCH 30.5 26.0 - 34.0 pg   MCHC 33.2 30.0 - 36.0 g/dL   RDW 13.6 11.5 - 15.5 %   Platelets 214 150 - 400 K/uL   Neutrophils Relative % 64 %   Neutro Abs 4.1 1.7 - 7.7 K/uL   Lymphocytes Relative 26 %   Lymphs Abs 1.7 0.7 - 4.0 K/uL   Monocytes Relative 9 %   Monocytes Absolute 0.6 0.1 - 1.0 K/uL   Eosinophils Relative 1 %   Eosinophils Absolute 0.1 0.0 - 0.7 K/uL   Basophils Relative 0 %   Basophils Absolute 0.0 0.0 - 0.1 K/uL    Comment: Performed at [facility], [address], [place], [place] [address]   Personally reviewed CT and reviewed with Dr. Ardeen Garland, Radiology - appendix is fluid filled and enlarged but no signs of any inflammation or stranding. No inguinal hernia.   Ct Abdomen Pelvis W Contrast  Result Date: 03/06/2018 CLINICAL DATA:  Right-sided abdominal pain EXAM: CT ABDOMEN AND  PELVIS WITH CONTRAST TECHNIQUE: Multidetector CT imaging of the abdomen and pelvis was performed using the standard protocol following bolus administration of intravenous contrast. CONTRAST:  173m ISOVUE-300 IOPAMIDOL (ISOVUE-300) INJECTION 61% COMPARISON:  CT KUB 03/06/2018 FINDINGS: Lower chest: Lung bases demonstrate no acute consolidation or effusion. Heart size borderline enlarged. Hepatobiliary: Status post cholecystectomy. Calcified liver granuloma. Prominent extrahepatic bile duct likely due to surgical change Pancreas: Unremarkable. No pancreatic ductal dilatation or surrounding inflammatory changes. Spleen: Normal in size without focal abnormality. Adrenals/Urinary Tract: Adrenal glands are  within normal limits. Probable scarring in the upper pole right kidney. Cysts in the left kidney. Bilateral subcentimeter hypodense renal lesions too small to further characterize. Bladder within normal limits. Stomach/Bowel: Stomach is nonenlarged. No dilated small bowel. Slightly dilated fluid-filled appendix measuring up to 9 mm. No stones. No significant surrounding inflammation. No extraluminal gas or fluid collections. No significant mucosal enhancement. Vascular/Lymphatic: Mild aortic atherosclerosis. No aneurysmal dilatation. No significantly enlarged lymph nodes. Reproductive: Uterus and bilateral adnexa are unremarkable. Other: Negative for free air or free fluid. Musculoskeletal: Degenerative changes of the spine. No acute or suspicious abnormality IMPRESSION: 1. Slightly dilated fluid-filled appendix but without surrounding soft tissue inflammation or other CT features to strongly suggest acute appendicitis. 2. There are otherwise no acute intra-abdominal or pelvic abnormalities visualized. Electronically Signed   By: KDonavan FoilM.D.   On: 03/06/2018 22:48   Ct Renal Stone Study  Addendum Date: 03/06/2018   ADDENDUM REPORT: 03/06/2018 20:39 ADDENDUM: Upon re-review of the images, appendix is identified and is borderline enlarged measuring up to 8-9 mm in diameter with normal size of the tip. No stones. No definitive surrounding inflammation. Repeat CT with oral and intravenous contrast could prove helpful if clinical picture suggests acute appendicitis. Case was discussed with Dr. RJeanell Sparrow Electronically Signed   By: KDonavan FoilM.D.   On: 03/06/2018 20:39   Result Date: 03/06/2018 CLINICAL DATA:  Flank pain EXAM: CT ABDOMEN AND PELVIS WITHOUT CONTRAST TECHNIQUE: Multidetector CT imaging of the abdomen and pelvis was performed following the standard protocol without IV contrast. COMPARISON:  Ultrasound abdomen 03/29/2009 FINDINGS: Lower chest: Lung bases demonstrate no acute consolidation or  effusion. Heart size upper normal. Small hiatal hernia. Hepatobiliary: Calcified liver granuloma. Surgical absence of the gallbladder. No biliary dilatation. Pancreas: Unremarkable. No pancreatic ductal dilatation or surrounding inflammatory changes. Spleen: Normal in size without focal abnormality. Adrenals/Urinary Tract: Adrenal glands are within normal limits. No hydronephrosis. Probable cyst midpole left kidney. Bladder unremarkable Stomach/Bowel: Stomach is within normal limits. Appendix not well seen but no right lower quadrant inflammation. Sigmoid colon diverticula without acute inflammation. No evidence of bowel wall thickening, distention, or inflammatory changes. Vascular/Lymphatic: Mild to moderate aortic atherosclerosis. No aneurysmal dilatation. No significantly enlarged lymph nodes. Reproductive: Uterus and bilateral adnexa are unremarkable. Other: Negative for free air or free fluid. Musculoskeletal: Degenerative changes of the spine. No acute or suspicious abnormality IMPRESSION: 1. Negative for nephrolithiasis, hydronephrosis or ureteral stone 2. Sigmoid colon diverticular disease without acute inflammation. Electronically Signed: By: KDonavan FoilM.D. On: 03/06/2018 19:44     Assessment & Plan:  MDORATHA MCSWAINis a 77y.o. female with what appears to be some muscle strain in the right groin region that is causing some referred pain into the RLQ.  No fevers, no leukocytosis, no pain over the appendix with deep palpation. This is very unlikely to be appendicitis.    -Po trial  -Discussed that this is related likely to some muscle strain,  and that it would be very unlikely to be developing appendicitis that did not declare itself in the last 12 hours since she was admitted  -Discussed following up with her PCP regarding the pain and if this persists, she made need a repeat colonoscopy to rule out other causes   All questions were answered to the satisfaction of the patient.   [name] 03/07/2018, 10:55 AM

## 2018-03-07 NOTE — Discharge Instructions (Signed)
Follow up with your primary care. You possibly have a pulled muscle or muscle type pain causing the pain in the right groin. Return to the hospital if you have fevers, > 101.5, worsening pain, nausea/vomiting.   If your pain persists, you may need further testing or colonoscopy, but I will defer to Jannifer Rodney, FNP.  Take ibuprofen and tylenol for your pain.  Abdominal Pain, Adult Abdominal pain can be caused by many things. Often, abdominal pain is not serious and it gets better with no treatment or by being treated at home. However, sometimes abdominal pain is serious. Your health care provider will do a medical history and a physical exam to try to determine the cause of your abdominal pain. Follow these instructions at home:  Take over-the-counter and prescription medicines only as told by your health care provider. Do not take a laxative unless told by your health care provider.  Drink enough fluid to keep your urine clear or pale yellow.  Watch your condition for any changes.  Keep all follow-up visits as told by your health care provider. This is important. Contact a health care provider if:  Your abdominal pain changes or gets worse.  You are not hungry or you lose weight without trying.  You are constipated or have diarrhea for more than 2-3 days.  You have pain when you urinate or have a bowel movement.  Your abdominal pain wakes you up at night.  Your pain gets worse with meals, after eating, or with certain foods.  You are throwing up and cannot keep anything down.  You have a fever. Get help right away if:  Your pain does not go away as soon as your health care provider told you to expect.  You cannot stop throwing up.  Your pain is only in areas of the abdomen, such as the right side or the left lower portion of the abdomen.  You have bloody or black stools, or stools that look like tar.  You have severe pain, cramping, or bloating in your abdomen.  You  have signs of dehydration, such as: ? Dark urine, very little urine, or no urine. ? Cracked lips. ? Dry mouth. ? Sunken eyes. ? Sleepiness. ? Weakness. This information is not intended to replace advice given to you by your health care provider. Make sure you discuss any questions you have with your health care provider. Document Released: 07/22/2005 Document Revised: 05/01/2016 Document Reviewed: 03/25/2016 Elsevier Interactive Patient Education  [employer].

## 2018-03-09 ENCOUNTER — Emergency Department (HOSPITAL_COMMUNITY)
Admission: EM | Admit: 2018-03-09 | Discharge: 2018-03-09 | Disposition: A | Discharge status: Home / Self Care | Payer: Medicare Other | Attending: Emergency Medicine | Admitting: Emergency Medicine

## 2018-03-09 ENCOUNTER — Emergency Department (HOSPITAL_COMMUNITY): Payer: Medicare Other

## 2018-03-09 ENCOUNTER — Encounter (HOSPITAL_COMMUNITY): Payer: Self-pay | Admitting: Emergency Medicine

## 2018-03-09 ENCOUNTER — Other Ambulatory Visit: Payer: Self-pay

## 2018-03-09 DIAGNOSIS — K5790 Diverticulosis of intestine, part unspecified, without perforation or abscess without bleeding: Secondary | ICD-10-CM | POA: Diagnosis not present

## 2018-03-09 DIAGNOSIS — K449 Diaphragmatic hernia without obstruction or gangrene: Secondary | ICD-10-CM | POA: Insufficient documentation

## 2018-03-09 DIAGNOSIS — Z79899 Other long term (current) drug therapy: Secondary | ICD-10-CM | POA: Insufficient documentation

## 2018-03-09 DIAGNOSIS — E039 Hypothyroidism, unspecified: Secondary | ICD-10-CM | POA: Insufficient documentation

## 2018-03-09 DIAGNOSIS — Z7982 Long term (current) use of aspirin: Secondary | ICD-10-CM | POA: Diagnosis not present

## 2018-03-09 DIAGNOSIS — R109 Unspecified abdominal pain: Secondary | ICD-10-CM | POA: Diagnosis not present

## 2018-03-09 DIAGNOSIS — R1031 Right lower quadrant pain: Secondary | ICD-10-CM | POA: Diagnosis not present

## 2018-03-09 DIAGNOSIS — J45909 Unspecified asthma, uncomplicated: Secondary | ICD-10-CM | POA: Diagnosis not present

## 2018-03-09 DIAGNOSIS — K5730 Diverticulosis of large intestine without perforation or abscess without bleeding: Secondary | ICD-10-CM | POA: Diagnosis not present

## 2018-03-09 HISTORY — DX: Diaphragmatic hernia without obstruction or gangrene: K44.9

## 2018-03-09 LAB — COMPREHENSIVE METABOLIC PANEL
ALBUMIN: 4.3 g/dL (ref 3.5–5.0)
ALT: 20 U/L (ref 14–54)
ANION GAP: 10 (ref 5–15)
AST: 20 U/L (ref 15–41)
Alkaline Phosphatase: 53 U/L (ref 38–126)
BUN: 14 mg/dL (ref 6–20)
CO2: 26 mmol/L (ref 22–32)
Calcium: 10.4 mg/dL — ABNORMAL HIGH (ref 8.9–10.3)
Chloride: 109 mmol/L (ref 101–111)
Creatinine, Ser: 0.68 mg/dL (ref 0.44–1.00)
GFR calc Af Amer: >60 mL/min (ref >60)
GFR calc non Af Amer: >60 mL/min (ref >60)
GLUCOSE: 96 mg/dL (ref 65–99)
POTASSIUM: 4.2 mmol/L (ref 3.5–5.1)
SODIUM: 145 mmol/L (ref 135–145)
Total Bilirubin: 0.7 mg/dL (ref 0.3–1.2)
Total Protein: 7.1 g/dL (ref 6.5–8.1)

## 2018-03-09 LAB — URINALYSIS, ROUTINE W REFLEX MICROSCOPIC
BILIRUBIN URINE: NEGATIVE
Glucose, UA: NEGATIVE mg/dL
HGB URINE DIPSTICK: NEGATIVE
Ketones, ur: NEGATIVE mg/dL
Leukocytes, UA: NEGATIVE
NITRITE: NEGATIVE
PH: 6 (ref 5.0–8.0)
Protein, ur: NEGATIVE mg/dL
SPECIFIC GRAVITY, URINE: 1.01 (ref 1.005–1.030)

## 2018-03-09 LAB — CBC
HEMATOCRIT: 42.2 % (ref 36.0–46.0)
HEMOGLOBIN: 13.8 g/dL (ref 12.0–15.0)
MCH: 29.5 pg (ref 26.0–34.0)
MCHC: 32.7 g/dL (ref 30.0–36.0)
MCV: 90.2 fL (ref 78.0–100.0)
Platelets: 255 10*3/uL (ref 150–400)
RBC: 4.68 MIL/uL (ref 3.87–5.11)
RDW: 13.1 % (ref 11.5–15.5)
WBC: 6.4 10*3/uL (ref 4.0–10.5)

## 2018-03-09 LAB — LIPASE, BLOOD: LIPASE: 32 U/L (ref 11–51)

## 2018-03-09 MED ORDER — ACETAMINOPHEN 325 MG PO TABS
650.0000 mg | ORAL_TABLET | Freq: Once | ORAL | Status: AC
  Administered 2018-03-09: 650 mg via ORAL
  Filled 2018-03-09: qty 2

## 2018-03-09 MED ORDER — FENTANYL CITRATE (PF) 100 MCG/2ML IJ SOLN
25.0000 ug | Freq: Once | INTRAMUSCULAR | Status: AC
  Administered 2018-03-09: 25 ug via INTRAVENOUS
  Filled 2018-03-09: qty 2

## 2018-03-09 MED ORDER — IOHEXOL 300 MG/ML  SOLN
100.0000 mL | Freq: Once | INTRAMUSCULAR | Status: AC | PRN
  Administered 2018-03-09: 100 mL via INTRAVENOUS

## 2018-03-09 NOTE — ED Notes (Signed)
Pt discharged from ED; instructions provided; Pt encouraged to return to ED if symptoms worsen and to f/u with PCP; Pt verbalized understanding of all instructions 

## 2018-03-09 NOTE — ED Triage Notes (Signed)
Onset 3 days ago ago developed right groin pain radiating slightly to RLQ. States seen at another hospital after being seen at an urgent care 2 days ago. States still has the pain currently 5/10 achy sharp worsening with movement.

## 2018-03-09 NOTE — ED Provider Notes (Signed)
[facility] EMERGENCY DEPARTMENT Provider Note   CSN: [id number] Arrival date & time: 03/09/18  1156     History   Chief Complaint Chief Complaint  Patient presents with  . Abdominal Pain    HPI [name] is a 77 y.o. female who presents today for evaluation of right lower quadrant/groin pain.  Pain started 4 to 5 days ago.  She was initially seen at an urgent care and then transferred to [facility] where she was admitted.  Her initial CT Noncon was unable to visualize the appendix.  Repeat CT with contrast showed a large fluid-filled appendix without obvious signs of infection or inflammation.  She was admitted for observation and discharge after her pain improved without treatment.  Initially her pain was waxing and waning, however has been constant for the past 2 days.  She reports generally decreased appetite, denies any urinary symptoms, no burning stinging increased urgency or frequency.  She reports that since her discharge she has been getting gradually worse and is still in pain.  HPI  Past Medical History:  Diagnosis Date  . Asthma   . Gallstone pancreatitis   . Hypothyroidism   . Osteoporosis   . Postinflammatory pulmonary fibrosis (HCC)   . Vertigo     Patient Active Problem List   Diagnosis Date Noted  . Hiatal hernia 03/09/2018  . RLQ abdominal pain   . Abdominal pain 03/06/2018  . Overweight (BMI 25.0-29.9) 08/14/2016  . Bronchitis with bronchospasm 04/10/2016  . Osteoporosis 12/02/2015  . Vitamin D deficiency 11/21/2015  . HLD (hyperlipidemia) 03/28/2013  . Allergic rhinitis 01/08/2010  . Hypothyroidism 07/03/2007  . Moderate persistent asthma in adult without complication 07/03/2007  . FIBROSIS, POSTINFLAMMATORY PULMONARY 07/03/2007    Past Surgical History:  Procedure Laterality Date  . CATARACT EXTRACTION W/PHACO  10/01/2011   Procedure: CATARACT EXTRACTION PHACO AND INTRAOCULAR LENS PLACEMENT (IOC);  Surgeon: Gemma Payor;  Location: [facility];  Service: Ophthalmology;  Laterality: Left;  CDE:13.74  . CATARACT EXTRACTION W/PHACO  10/22/2011   Procedure: CATARACT EXTRACTION PHACO AND INTRAOCULAR LENS PLACEMENT (IOC);  Surgeon: Gemma Payor;  Location: [facility];  Service: Ophthalmology;  Laterality: Right;  CDE:9.98  . CHOLECYSTECTOMY  2010  . TUBAL LIGATION       OB History   None      Home Medications    Prior to Admission medications   Medication Sig Start Date End Date Taking? Authorizing Provider  albuterol (PROAIR HFA) 108 (90 Base) MCG/ACT inhaler Inhale 2 puffs into the lungs every 6 (six) hours as needed for wheezing or shortness of breath. 11/18/17   Mannam, Praveen, MD  aspirin EC 81 MG tablet Take 81 mg by mouth daily.      [provider]  Cholecalciferol (VITAMIN D-3) 1000 UNITS CAPS Take 5,000 Units by mouth daily.     [provider]  Fluticasone Furoate (ARNUITY ELLIPTA) 100 MCG/ACT AEPB Inhale 1 puff into the lungs daily. 11/18/17   Mannam, Colbert Coyer, MD  ibuprofen (ADVIL,MOTRIN) 200 MG tablet Take 400 mg by mouth every 6 (six) hours as needed for mild pain.    [provider]  levothyroxine (SYNTHROID, LEVOTHROID) 88 MCG tablet TAKE ONE TABLET BY MOUTH ONCE DAILY 09/27/17   Jannifer Rodney A, FNP  meclizine (ANTIVERT) 25 MG tablet Take 1 tablet (25 mg total) by mouth 3 (three) times daily as needed for dizziness. 01/03/15   Junie Spencer, FNP  Multiple Vitamins-Minerals (CENTRUM SILVER PO) Take  1 tablet by mouth daily. Reported on 12/12/2015    [provider]    Family History Family History  Problem Relation Age of Onset  . Heart failure Mother   . Emphysema Mother   . Other Father        blood clot in brain  . Breast cancer Sister   . Cancer Sister        breast  . Breast cancer Sister   . Cancer Sister        breast  . Asthma Sister   . Cancer Brother        lung  . Anesthesia problems Neg Hx   . Hypotension Neg Hx   . Malignant  hyperthermia Neg Hx   . Pseudochol deficiency Neg Hx     Social History Social History   Tobacco Use  . Smoking status: Never Smoker  . Smokeless tobacco: Never Used  Substance Use Topics  . Alcohol use: No  . Drug use: No     Allergies   Sulfonamide derivatives   Review of Systems Review of Systems  Constitutional: Positive for appetite change. Negative for chills and fever.  HENT: Negative for ear pain and sore throat.   Eyes: Negative for pain and visual disturbance.  Respiratory: Negative for cough and shortness of breath.   Cardiovascular: Negative for chest pain and palpitations.  Gastrointestinal: Positive for abdominal pain. Negative for blood in stool, constipation, diarrhea, nausea and vomiting.  Genitourinary: Negative for dysuria, flank pain, hematuria and urgency.  Musculoskeletal: Negative for arthralgias, back pain, neck pain and neck stiffness.  Skin: Negative for color change and rash.  Neurological: Negative for seizures, syncope and headaches.  All other systems reviewed and are negative.    Physical Exam Updated Vital Signs BP 110/80   Pulse (!) 56   Temp 98.5 F (36.9 C) (Oral)   Resp 16   Ht 5' (1.524 m)   Wt 61.7 kg (136 lb)   SpO2 98%   BMI 26.56 kg/m   Physical Exam  Constitutional: She is oriented to person, place, and time. She appears well-developed and well-nourished. No distress.  HENT:  Head: Normocephalic and atraumatic.  Mouth/Throat: Oropharynx is clear and moist.  Eyes: Conjunctivae are normal.  Neck: Neck supple.  Cardiovascular: Normal rate and regular rhythm.  No murmur heard. Pulmonary/Chest: Effort normal and breath sounds normal. No respiratory distress.  Abdominal: Soft. Normal appearance. There is no tenderness. There is no rigidity, no rebound, no guarding, no CVA tenderness and no tenderness at McBurney's point.  Musculoskeletal: She exhibits no edema.  Neurological: She is alert and oriented to person, place,  and time.  Skin: Skin is warm and dry.  Psychiatric: She has a normal mood and affect. Her behavior is normal.  Nursing note and vitals reviewed.    ED Treatments / Results  Labs (all labs ordered are listed, but only abnormal results are displayed) Labs Reviewed  COMPREHENSIVE METABOLIC PANEL - Abnormal; Notable for the following components:      Result Value   Calcium 10.4 (*)    All other components within normal limits  LIPASE, BLOOD  CBC  URINALYSIS, ROUTINE W REFLEX MICROSCOPIC    EKG None  Radiology Ct Abdomen Pelvis W Contrast  Result Date: 03/09/2018 CLINICAL DATA:  Right groin and right lower quadrant abdominal pain beginning 3 days ago. Slightly dilated appendix on recent CT. EXAM: CT ABDOMEN AND PELVIS WITH CONTRAST TECHNIQUE: Multidetector CT imaging of the abdomen and pelvis  was performed using the standard protocol following bolus administration of intravenous contrast. CONTRAST:  OMNIPAQUE IOHEXOL 300 MG/ML  SOLN COMPARISON:  03/06/2018 FINDINGS: Lower chest: Minimal scarring or atelectasis in the lung bases. No pleural effusion. Hepatobiliary: Unchanged right hepatic lobe calcified granuloma. Unchanged mild extrahepatic biliary ductal prominence following cholecystectomy. Pancreas: Unremarkable. Spleen: Unremarkable. Adrenals/Urinary Tract: Unremarkable adrenal glands. Unchanged 1.9 cm left renal cyst and additional subcentimeter bilateral low-density renal lesions which are too small to fully characterize. Right upper pole renal scarring. No hydronephrosis. Unremarkable bladder. Stomach/Bowel: A small sliding hiatal hernia is noted. There is no evidence of bowel obstruction. Mild sigmoid colon diverticulosis is noted without evidence of diverticulitis. Mild appendiceal dilatation on the prior study has resolved, and the appendix now contains gas and only trace residual fluid without wall thickening or surrounding inflammatory change. Vascular/Lymphatic: Abdominal  aortic atherosclerosis without aneurysm. No enlarged lymph nodes. Reproductive: Unremarkable uterus and ovaries. Other: No intraperitoneal free fluid.  No abdominal wall hernia. Musculoskeletal: Moderate lumbar disc degeneration. No suspicious osseous lesion. IMPRESSION: 1. Interval resolution of mild appendiceal dilatation. No evidence of appendicitis or other acute abnormality in the abdomen or pelvis. 2.  Aortic Atherosclerosis (ICD10-I70.0). Electronically Signed   By: Sebastian Ache M.D.   On: 03/09/2018 16:07    Procedures Procedures (including critical care time)  Medications Ordered in ED Medications  iohexol (OMNIPAQUE) 300 MG/ML solution 100 mL (100 mLs Intravenous Contrast Given 03/09/18 1538)  fentaNYL (SUBLIMAZE) injection 25 mcg (25 mcg Intravenous Given 03/09/18 1614)  acetaminophen (TYLENOL) tablet 650 mg (650 mg Oral Given 03/09/18 1632)     Initial Impression / Assessment and Plan / ED Course  I have reviewed the triage vital signs and the nursing notes.  Pertinent labs & imaging results that were available during my care of the patient were reviewed by me and considered in my medical decision making (see chart for details).    Patient presents today for evaluation of continued right lower quadrant pain.  She was recently admitted at outside hospital overnight with abnormal CT scan and concerned that developing appendicitis, however she was subsequently discharged.  Patient reports continued pain since then.  Labs were obtained and reviewed no significant abnormalities, appears consistent with her baseline.  No leukocytosis.  Urine is without evidence of infection.  She is hemodynamically stable.  Given history of CT scan showing fluid in appendix with continued pain repeat CT scan was performed showing the appendix related abnormalities have resolved, no cause for her pain found.  Suspect that her pain may be musculoskeletal in nature as it is exacerbated with movement.  Pain was  treated in the emergency room fentanyl and Tylenol with significant relief.  Conservative care discussed.  Patient discharged home with appropriate return precautions and good outpatient follow-up.  This patient was seen as a shared visit with Dr. Rosalia Hammers who evaluated the patient.     Final Clinical Impressions(s) / ED Diagnoses   Final diagnoses:  Right lower quadrant abdominal pain  Hiatal hernia  Diverticulosis    ED Discharge Orders    None       Norman Clay 03/09/18 2339    Margarita Grizzle, MD 03/13/18 1101

## 2018-03-09 NOTE — Discharge Instructions (Signed)
Please take Ibuprofen (Advil, motrin) and Tylenol (acetaminophen) to relieve your pain.  You may take up to 600 MG (3 pills) of normal strength ibuprofen every 8 hours as needed.  In between doses of ibuprofen you make take tylenol, up to 1,000 mg (two extra strength pills).  Do not take more than 3,000 mg tylenol in a 24 hour period.  Please check all medication labels as many medications such as pain and cold medications may contain tylenol.  Do not drink alcohol while taking these medications.  Do not take other NSAID'S while taking ibuprofen (such as aleve or naproxen).  Please take ibuprofen with food to decrease stomach upset.  Do not take more than needed.  You may use a heating pad.  Please make sure to monitor your skin to assure you do not have any burns.    Please make sure you are staying well hydrated.   If you develop fevers, pain with urination, diarrhea, nausea or vomiting, or have any additional concerns then please seek additional medical care.

## 2018-03-09 NOTE — ED Notes (Signed)
Patient transported to CT 

## 2018-03-10 ENCOUNTER — Ambulatory Visit (INDEPENDENT_AMBULATORY_CARE_PROVIDER_SITE_OTHER): Payer: Medicare Other | Admitting: Family

## 2018-03-10 ENCOUNTER — Encounter: Payer: Self-pay | Admitting: Family

## 2018-03-10 VITALS — BP 129/77 | HR 79 | Temp 97.6°F | Ht 60.0 in | Wt 133.0 lb

## 2018-03-10 DIAGNOSIS — R1032 Left lower quadrant pain: Secondary | ICD-10-CM | POA: Diagnosis not present

## 2018-03-10 DIAGNOSIS — Z09 Encounter for follow-up examination after completed treatment for conditions other than malignant neoplasm: Secondary | ICD-10-CM | POA: Diagnosis not present

## 2018-03-10 NOTE — Patient Instructions (Signed)

## 2018-03-10 NOTE — Progress Notes (Signed)
   Subjective:    Patient ID: [name], female    DOB: 1941/05/17, 77 y.o.   MRN: [id number]  Chief Complaint  Patient presents with  . Hospitalization Follow-up    pain in lower abdomen    HPI PT presents to the office today for hospital follow up. PT went to the Urgernt Care on 03/06/18 with lower abdominal pain and thought this may be related to her appendix. She was kept over night and reevaluated the next morning and felt her tenderness had resolved and thought she was stable for discharge 03/07/18.   States her pain continued and was worried so she went back to the ED. She had another CT scan and showed resolution of mild appendiceal dilation.   She was told her pain might be related to MSK since she continued to have pain and it was worse rotation in her right groin and RLQ abdomen.   CT renal negative for stone, CBC and urine negative.   [facility] notes reviewed.   Review of Systems  Gastrointestinal: Positive for abdominal pain. Negative for blood in stool, diarrhea and nausea.  All other systems reviewed and are negative.      Objective:   Physical Exam  Constitutional: She is oriented to person, place, and time. She appears well-developed and well-nourished. No distress.  HENT:  Head: Normocephalic and atraumatic.  Right Ear: External ear normal.  Left Ear: External ear normal.  Mouth/Throat: Oropharynx is clear and moist.  Eyes: Pupils are equal, round, and reactive to light.  Neck: Normal range of motion. Neck supple. No thyromegaly present.  Cardiovascular: Normal rate, regular rhythm, normal heart sounds and intact distal pulses.  No murmur heard. Pulmonary/Chest: Effort normal and breath sounds normal. No respiratory distress. She has no wheezes.  Abdominal: Soft. Bowel sounds are normal. She exhibits no distension. There is no tenderness (no tenderness).  Musculoskeletal: Normal range of motion. She exhibits tenderness. She exhibits no edema.  Mild  right hip pain with rotation  Neurological: She is alert and oriented to person, place, and time. She has normal reflexes. No cranial nerve deficit.  Skin: Skin is warm and dry.  Psychiatric: She has a normal mood and affect. Her behavior is normal. Judgment and thought content normal.  Vitals reviewed.    BP 129/77   Pulse 79   Temp 97.6 F (36.4 C) (Oral)   Ht 5' (1.524 m)   Wt 133 lb (60.3 kg)   BMI 25.97 kg/m       Assessment & Plan:  Katalia was seen today for hospitalization follow-up.  Diagnoses and all orders for this visit:  LLQ pain  Hospital discharge follow-up    No tenderness on exam today VS stable Labs reviewed that were drawn yesterday I do believe this MSK pain- Tylenol or motrin prn Rest Ice RTO if pain does not improve or worsen  Jannifer Rodney, FNP

## 2018-03-29 DIAGNOSIS — H04123 Dry eye syndrome of bilateral lacrimal glands: Secondary | ICD-10-CM | POA: Diagnosis not present

## 2018-03-29 DIAGNOSIS — H5203 Hypermetropia, bilateral: Secondary | ICD-10-CM | POA: Diagnosis not present

## 2018-03-29 DIAGNOSIS — H52222 Regular astigmatism, left eye: Secondary | ICD-10-CM | POA: Diagnosis not present

## 2018-03-29 DIAGNOSIS — H524 Presbyopia: Secondary | ICD-10-CM | POA: Diagnosis not present

## 2018-04-18 DIAGNOSIS — B355 Tinea imbricata: Secondary | ICD-10-CM | POA: Diagnosis not present

## 2018-04-18 DIAGNOSIS — Z08 Encounter for follow-up examination after completed treatment for malignant neoplasm: Secondary | ICD-10-CM | POA: Diagnosis not present

## 2018-04-18 DIAGNOSIS — Z85820 Personal history of malignant melanoma of skin: Secondary | ICD-10-CM | POA: Diagnosis not present

## 2018-04-18 DIAGNOSIS — Z1283 Encounter for screening for malignant neoplasm of skin: Secondary | ICD-10-CM | POA: Diagnosis not present

## 2018-06-08 ENCOUNTER — Telehealth: Payer: Self-pay | Admitting: Family

## 2018-06-27 ENCOUNTER — Other Ambulatory Visit: Payer: Self-pay | Admitting: Family

## 2018-06-27 DIAGNOSIS — E039 Hypothyroidism, unspecified: Secondary | ICD-10-CM

## 2018-07-25 ENCOUNTER — Ambulatory Visit (INDEPENDENT_AMBULATORY_CARE_PROVIDER_SITE_OTHER): Payer: Medicare Other

## 2018-07-25 DIAGNOSIS — Z23 Encounter for immunization: Secondary | ICD-10-CM

## 2018-09-14 ENCOUNTER — Telehealth: Payer: Self-pay | Admitting: *Deleted

## 2018-09-14 ENCOUNTER — Other Ambulatory Visit: Payer: Self-pay | Admitting: *Deleted

## 2018-09-14 ENCOUNTER — Other Ambulatory Visit: Payer: Medicare Other

## 2018-09-14 DIAGNOSIS — E785 Hyperlipidemia, unspecified: Secondary | ICD-10-CM

## 2018-09-14 DIAGNOSIS — E039 Hypothyroidism, unspecified: Secondary | ICD-10-CM

## 2018-09-14 NOTE — Telephone Encounter (Signed)
Patient wants to have blood work before her appointment.    Tsh,  Lipid, and Cmp was ordered.  Please add any other lab orderes needed.

## 2018-09-15 ENCOUNTER — Other Ambulatory Visit: Payer: Self-pay | Admitting: Family

## 2018-09-15 LAB — CMP14+EGFR
A/G RATIO: 2 (ref 1.2–2.2)
ALBUMIN: 4.5 g/dL (ref 3.5–4.8)
ALT: 17 IU/L (ref 0–32)
AST: 17 IU/L (ref 0–40)
Alkaline Phosphatase: 64 IU/L (ref 39–117)
BILIRUBIN TOTAL: 0.5 mg/dL (ref 0.0–1.2)
BUN / CREAT RATIO: 19 (ref 12–28)
BUN: 16 mg/dL (ref 8–27)
CO2: 22 mmol/L (ref 20–29)
Calcium: 10 mg/dL (ref 8.7–10.3)
Chloride: 104 mmol/L (ref 96–106)
Creatinine, Ser: 0.83 mg/dL (ref 0.57–1.00)
GFR calc Af Amer: 79 mL/min/{1.73_m2} (ref >59)
GFR, EST NON AFRICAN AMERICAN: 69 mL/min/{1.73_m2} (ref >59)
GLOBULIN, TOTAL: 2.3 g/dL (ref 1.5–4.5)
Glucose: 86 mg/dL (ref 65–99)
POTASSIUM: 4.2 mmol/L (ref 3.5–5.2)
Sodium: 142 mmol/L (ref 134–144)
TOTAL PROTEIN: 6.8 g/dL (ref 6.0–8.5)

## 2018-09-15 LAB — LIPID PANEL
CHOLESTEROL TOTAL: 192 mg/dL (ref 100–199)
Chol/HDL Ratio: 3.9 ratio (ref 0.0–4.4)
HDL: 49 mg/dL (ref >39)
LDL Calculated: 121 mg/dL — ABNORMAL HIGH (ref 0–99)
TRIGLYCERIDES: 109 mg/dL (ref 0–149)
VLDL CHOLESTEROL CAL: 22 mg/dL (ref 5–40)

## 2018-09-15 LAB — TSH: TSH: 2.97 u[IU]/mL (ref 0.450–4.500)

## 2018-09-20 ENCOUNTER — Ambulatory Visit (INDEPENDENT_AMBULATORY_CARE_PROVIDER_SITE_OTHER): Payer: Medicare Other | Admitting: Family

## 2018-09-20 ENCOUNTER — Ambulatory Visit (INDEPENDENT_AMBULATORY_CARE_PROVIDER_SITE_OTHER): Payer: Medicare Other

## 2018-09-20 ENCOUNTER — Encounter: Payer: Self-pay | Admitting: Family

## 2018-09-20 VITALS — BP 133/78 | HR 69 | Temp 97.0°F | Ht 60.0 in | Wt 137.2 lb

## 2018-09-20 DIAGNOSIS — M810 Age-related osteoporosis without current pathological fracture: Secondary | ICD-10-CM

## 2018-09-20 DIAGNOSIS — J4540 Moderate persistent asthma, uncomplicated: Secondary | ICD-10-CM | POA: Diagnosis not present

## 2018-09-20 DIAGNOSIS — M8589 Other specified disorders of bone density and structure, multiple sites: Secondary | ICD-10-CM | POA: Diagnosis not present

## 2018-09-20 DIAGNOSIS — E039 Hypothyroidism, unspecified: Secondary | ICD-10-CM

## 2018-09-20 DIAGNOSIS — E663 Overweight: Secondary | ICD-10-CM

## 2018-09-20 DIAGNOSIS — J8410 Pulmonary fibrosis, unspecified: Secondary | ICD-10-CM | POA: Diagnosis not present

## 2018-09-20 DIAGNOSIS — Z780 Asymptomatic menopausal state: Secondary | ICD-10-CM | POA: Diagnosis not present

## 2018-09-20 DIAGNOSIS — E559 Vitamin D deficiency, unspecified: Secondary | ICD-10-CM

## 2018-09-20 DIAGNOSIS — E785 Hyperlipidemia, unspecified: Secondary | ICD-10-CM

## 2018-09-20 MED ORDER — LEVOTHYROXINE SODIUM 88 MCG PO TABS: 88.0000 ug | ORAL_TABLET | Freq: Every day | ORAL | 2 refills | Status: DC

## 2018-09-20 NOTE — Patient Instructions (Signed)
Osteoporosis Osteoporosis is the thinning and loss of density in the bones. Osteoporosis makes the bones more brittle, fragile, and likely to break (fracture). Over time, osteoporosis can cause the bones to become so weak that they fracture after a simple fall. The bones most likely to fracture are the bones in the hip, wrist, and spine. What are the causes? The exact cause is not known. What increases the risk? Anyone can develop osteoporosis. You may be at greater risk if you have a family history of the condition or have poor nutrition. You may also have a higher risk if you are:  Female.  50 years old or older.  A smoker.  Not physically active.  White or Asian.  Slender.  What are the signs or symptoms? A fracture might be the first sign of the disease, especially if it results from a fall or injury that would not usually cause a bone to break. Other signs and symptoms include:  Low back and neck pain.  Stooped posture.  Height loss.  How is this diagnosed? To make a diagnosis, your health care provider may:  Take a medical history.  Perform a physical exam.  Order tests, such as: ? A bone mineral density test. ? A dual-energy X-ray absorptiometry test.  How is this treated? The goal of osteoporosis treatment is to strengthen your bones to reduce your risk of a fracture. Treatment may involve:  Making lifestyle changes, such as: ? Eating a diet rich in calcium. ? Doing weight-bearing and muscle-strengthening exercises. ? Stopping tobacco use. ? Limiting alcohol intake.  Taking medicine to slow the process of bone loss or to increase bone density.  Monitoring your levels of calcium and vitamin D.  Follow these instructions at home:  Include calcium and vitamin D in your diet. Calcium is important for bone health, and vitamin D helps the body absorb calcium.  Perform weight-bearing and muscle-strengthening exercises as directed by your health care  provider.  Do not use any tobacco products, including cigarettes, chewing tobacco, and electronic cigarettes. If you need help quitting, ask your health care provider.  Limit your alcohol intake.  Take medicines only as directed by your health care provider.  Keep all follow-up visits as directed by your health care provider. This is important.  Take precautions at home to lower your risk of falling, such as: ? Keeping rooms well lit and clutter free. ? Installing safety rails on stairs. ? Using rubber mats in the bathroom and other areas that are often wet or slippery. Get help right away if: You fall or injure yourself. This information is not intended to replace advice given to you by your health care provider. Make sure you discuss any questions you have with your health care provider. Document Released: 07/22/2005 Document Revised: 03/16/2016 Document Reviewed: 03/22/2014 Elsevier Interactive Patient Education  2018 [employer]  

## 2018-09-20 NOTE — Progress Notes (Signed)
Subjective:    Patient ID: [name], female    DOB: [birth year], 77 y.o.   MRN: [id number]  Chief Complaint  Patient presents with  . Medical Management of Chronic Issues    refills   PT presents to the office today for chronic follow up.  Thyroid Problem  Presents for follow-up visit. Symptoms include constipation, fatigue and hoarse voice. Patient reports no diarrhea. The symptoms have been stable. Her past medical history is significant for hyperlipidemia.  Hyperlipidemia  This is a chronic problem. The current episode started more than 1 year ago. The problem is controlled. Recent lipid tests were reviewed and are normal. Current antihyperlipidemic treatment includes statins and diet change. The current treatment provides moderate improvement of lipids. Risk factors for coronary artery disease include dyslipidemia, hypertension and post-menopausal.  Asthma  She complains of cough and hoarse voice. There is no wheezing. This is a chronic problem. The current episode started more than 1 year ago. The problem occurs intermittently. The problem has been waxing and waning. Her symptoms are alleviated by rest and leukotriene antagonist. She reports moderate improvement on treatment. Her symptoms are not alleviated by cold air. Her past medical history is significant for asthma.  Osteoporosis  PT takes calcium and vit d, but no other medication. Last Dexascan 2017.    Review of Systems  Constitutional: Positive for fatigue.  HENT: Positive for hoarse voice.   Respiratory: Positive for cough. Negative for wheezing.   Gastrointestinal: Positive for constipation. Negative for diarrhea.  All other systems reviewed and are negative.      Objective:   Physical Exam  Constitutional: She is oriented to person, place, and time. She appears well-developed and well-nourished. No distress.  HENT:  Head: Normocephalic and atraumatic.  Right Ear: External ear normal.  Left Ear: External ear  normal.  Mouth/Throat: Oropharynx is clear and moist.  Eyes: Pupils are equal, round, and reactive to light.  Neck: Normal range of motion. Neck supple. No thyromegaly present.  Cardiovascular: Normal rate, regular rhythm, normal heart sounds and intact distal pulses.  No murmur heard. Pulmonary/Chest: Effort normal and breath sounds normal. No respiratory distress. She has no wheezes.  Abdominal: Soft. Bowel sounds are normal. She exhibits no distension. There is no tenderness.  Musculoskeletal: Normal range of motion. She exhibits no edema or tenderness.  Neurological: She is alert and oriented to person, place, and time. She has normal reflexes. No cranial nerve deficit.  Skin: Skin is warm and dry.  Psychiatric: She has a normal mood and affect. Her behavior is normal. Judgment and thought content normal.  Vitals reviewed.    BP 133/78   Pulse 69   Temp (!) 97 F (36.1 C) (Oral)   Ht 5' (1.524 m)   Wt 137 lb 3.2 oz (62.2 kg)   BMI 26.80 kg/m      Assessment & Plan:  Takela P Petrich comes in today with chief complaint of Medical Management of Chronic Issues (refills)   Diagnosis and orders addressed:  1. Hypothyroidism - levothyroxine (SYNTHROID, LEVOTHROID) 88 MCG tablet; Take 1 tablet (88 mcg total) by mouth daily.  Dispense: 90 tablet; Refill: 2  2. Postinflammatory pulmonary fibrosis (HCC)  3. Moderate persistent asthma in adult without complication  4. Osteoporosis, unspecified osteoporosis type, unspecified pathological fracture presence - DG [facility] DEXA  5. Overweight (BMI 25.0-29.9)  6. Vitamin D deficiency  7. Hyperlipidemia, unspecified hyperlipidemia type   Labs reviewed and discussed  Health Maintenance reviewed Diet  and exercise encouraged  Follow up plan: 6 months    Jannifer Rodney, FNP

## 2018-10-10 ENCOUNTER — Other Ambulatory Visit: Payer: Self-pay | Admitting: Pulmonary Disease

## 2018-10-12 ENCOUNTER — Other Ambulatory Visit: Payer: Self-pay | Admitting: *Deleted

## 2018-10-12 ENCOUNTER — Other Ambulatory Visit: Payer: Medicare Other

## 2018-10-12 DIAGNOSIS — Z1211 Encounter for screening for malignant neoplasm of colon: Secondary | ICD-10-CM | POA: Diagnosis not present

## 2018-10-13 DIAGNOSIS — H6121 Impacted cerumen, right ear: Secondary | ICD-10-CM | POA: Diagnosis not present

## 2018-10-13 DIAGNOSIS — H903 Sensorineural hearing loss, bilateral: Secondary | ICD-10-CM | POA: Diagnosis not present

## 2018-10-13 DIAGNOSIS — H73822 Atrophic nonflaccid tympanic membrane, left ear: Secondary | ICD-10-CM | POA: Diagnosis not present

## 2018-10-13 LAB — FECAL OCCULT BLOOD, IMMUNOCHEMICAL: FECAL OCCULT BLD: POSITIVE — AB

## 2018-10-21 ENCOUNTER — Other Ambulatory Visit: Payer: Self-pay

## 2018-10-21 DIAGNOSIS — R195 Other fecal abnormalities: Secondary | ICD-10-CM

## 2018-10-27 ENCOUNTER — Other Ambulatory Visit: Payer: Medicare Other

## 2018-10-27 DIAGNOSIS — R195 Other fecal abnormalities: Secondary | ICD-10-CM | POA: Diagnosis not present

## 2018-10-27 LAB — CBC WITH DIFFERENTIAL/PLATELET
BASOS: 1 %
Basophils Absolute: 0 10*3/uL (ref 0.0–0.2)
EOS (ABSOLUTE): 0.2 10*3/uL (ref 0.0–0.4)
Eos: 4 %
HEMATOCRIT: 40 % (ref 34.0–46.6)
Hemoglobin: 13.6 g/dL (ref 11.1–15.9)
IMMATURE GRANS (ABS): 0 10*3/uL (ref 0.0–0.1)
IMMATURE GRANULOCYTES: 0 %
LYMPHS: 25 %
Lymphocytes Absolute: 1.6 10*3/uL (ref 0.7–3.1)
MCH: 30.3 pg (ref 26.6–33.0)
MCHC: 34 g/dL (ref 31.5–35.7)
MCV: 89 fL (ref 79–97)
Monocytes Absolute: 0.6 10*3/uL (ref 0.1–0.9)
Monocytes: 9 %
NEUTROS ABS: 3.8 10*3/uL (ref 1.4–7.0)
NEUTROS PCT: 61 %
Platelets: 277 10*3/uL (ref 150–450)
RBC: 4.49 x10E6/uL (ref 3.77–5.28)
RDW: 13.1 % (ref 12.3–15.4)
WBC: 6.3 10*3/uL (ref 3.4–10.8)

## 2018-11-07 ENCOUNTER — Other Ambulatory Visit: Payer: Medicare Other

## 2018-11-07 DIAGNOSIS — R195 Other fecal abnormalities: Secondary | ICD-10-CM

## 2018-11-09 LAB — FECAL OCCULT BLOOD, IMMUNOCHEMICAL: Fecal Occult Bld: NEGATIVE

## 2018-11-16 ENCOUNTER — Other Ambulatory Visit: Payer: Self-pay | Admitting: Pulmonary Disease

## 2018-11-18 ENCOUNTER — Ambulatory Visit: Payer: Medicare Other | Admitting: Pulmonary Disease

## 2018-11-18 ENCOUNTER — Encounter: Payer: Self-pay | Admitting: Pulmonary Disease

## 2018-11-18 VITALS — BP 136/80 | HR 71 | Ht 62.0 in | Wt 137.4 lb

## 2018-11-18 DIAGNOSIS — J4530 Mild persistent asthma, uncomplicated: Secondary | ICD-10-CM

## 2018-11-18 MED ORDER — FLUTICASONE FUROATE 100 MCG/ACT IN AEPB: 1.0000 | INHALATION_SPRAY | Freq: Every day | RESPIRATORY_TRACT | 2 refills | Status: DC

## 2018-11-18 NOTE — Progress Notes (Signed)
Margit HanksMaxine P Hinton    [id number]    03-Jul-1941  Primary Care Physician:Hawks, Edilia Bohristy A, FNP  Referring Physician: Junie SpencerHawks, Christy A, FNP [address] [place], [place] [id number]  Chief complaint:   Follow-up from mild persistent asthma.  HPI: [name] is a 78 year old with mild persistent asthma. She is a former patient of Dr. Delford FieldWright. Her symptoms are well controlled on steroid inhaler. She does not need to use her albuterol rescue inhaler. She was treated for flu A infection with tamiflu in march 2017.  Qvar has been changed to arnuity in Jan 2018 due to insurance reasons. His symptoms of asthma has remained stable on it.  Interim History: She feels well with no issues.  Stable on her inhalers with no exacerbations.  Outpatient Encounter Medications as of 11/18/2018  Medication Sig  . albuterol (PROAIR HFA) 108 (90 Base) MCG/ACT inhaler Inhale 2 puffs into the lungs every 6 (six) hours as needed for wheezing or shortness of breath.  Marland Kitchen. aspirin EC 81 MG tablet Take 81 mg by mouth daily.    . Biotin 5000 MCG CAPS Take 5,000 mcg by mouth daily.  . Cholecalciferol (VITAMIN D-3) 1000 UNITS CAPS Take 5,000 Units by mouth daily.   Marland Kitchen. levothyroxine (SYNTHROID, LEVOTHROID) 88 MCG tablet Take 1 tablet (88 mcg total) by mouth daily.  . meclizine (ANTIVERT) 25 MG tablet Take 1 tablet (25 mg total) by mouth 3 (three) times daily as needed for dizziness.  . Multiple Vitamins-Minerals (CENTRUM SILVER PO) Take 1 tablet by mouth daily. Reported on 12/12/2015  . ARNUITY ELLIPTA 100 MCG/ACT AEPB INHALE 1 PUFF BY MOUTH ONCE DAILY  . ibuprofen (ADVIL,MOTRIN) 200 MG tablet Take 400 mg by mouth every 6 (six) hours as needed for mild pain.   No facility-administered encounter medications on file as of 11/18/2018.     Allergies as of 11/18/2018 - Review Complete 11/18/2018  Allergen Reaction Noted  . Sulfonamide derivatives  12/15/2007    Past Medical History:  Diagnosis Date  . Asthma     . Gallstone pancreatitis   . Hypothyroidism   . Osteoporosis   . Postinflammatory pulmonary fibrosis (HCC)   . Vertigo     Past Surgical History:  Procedure Laterality Date  . CATARACT EXTRACTION W/PHACO  10/01/2011   Procedure: CATARACT EXTRACTION PHACO AND INTRAOCULAR LENS PLACEMENT (IOC);  Surgeon: Gemma PayorKerry Hunt;  Location: [facility];  Service: Ophthalmology;  Laterality: Left;  CDE:13.74  . CATARACT EXTRACTION W/PHACO  10/22/2011   Procedure: CATARACT EXTRACTION PHACO AND INTRAOCULAR LENS PLACEMENT (IOC);  Surgeon: Gemma PayorKerry Hunt;  Location: [facility];  Service: Ophthalmology;  Laterality: Right;  CDE:9.98  . CHOLECYSTECTOMY  2010  . TUBAL LIGATION      Family History  Problem Relation Age of Onset  . Heart failure Mother   . Emphysema Mother   . Other Father        blood clot in brain  . Breast cancer Sister   . Cancer Sister        breast  . Breast cancer Sister   . Cancer Sister        breast  . Asthma Sister   . Cancer Brother        lung  . Anesthesia problems Neg Hx   . Hypotension Neg Hx   . Malignant hyperthermia Neg Hx   . Pseudochol deficiency Neg Hx     Social History   Socioeconomic History  . Marital status:  Married    Spouse name: Not on file  . Number of children: 3  . Years of education: Not on file  . Highest education level: Not on file  Occupational History  . Occupation: retired Scientist, product/process development  Social Needs  . Financial resource strain: Not on file  . Food insecurity:    Worry: Not on file    Inability: Not on file  . Transportation needs:    Medical: Not on file    Non-medical: Not on file  Tobacco Use  . Smoking status: Never Smoker  . Smokeless tobacco: Never Used  Substance and Sexual Activity  . Alcohol use: No  . Drug use: No  . Sexual activity: Not on file  Lifestyle  . Physical activity:    Days per week: Not on file    Minutes per session: Not on file  . Stress: Not on file  Relationships  . Social connections:    Talks on  phone: Not on file    Gets together: Not on file    Attends religious service: Not on file    Active member of club or organization: Not on file    Attends meetings of clubs or organizations: Not on file    Relationship status: Not on file  . Intimate partner violence:    Fear of current or ex partner: Not on file    Emotionally abused: Not on file    Physically abused: Not on file    Forced sexual activity: Not on file  Other Topics Concern  . Not on file  Social History Narrative  . Not on file   Review of systems: Review of Systems  Constitutional: Negative for fever and chills.  HENT: Negative.   Eyes: Negative for blurred vision.  Respiratory: as per HPI  Cardiovascular: Negative for chest pain and palpitations.  Gastrointestinal: Negative for vomiting, diarrhea, blood per rectum. Genitourinary: Negative for dysuria, urgency, frequency and hematuria.  Musculoskeletal: Negative for myalgias, back pain and joint pain.  Skin: Negative for itching and rash.  Neurological: Negative for dizziness, tremors, focal weakness, seizures and loss of consciousness.  Endo/Heme/Allergies: Negative for environmental allergies.  Psychiatric/Behavioral: Negative for depression, suicidal ideas and hallucinations.  All other systems reviewed and are negative.  Physical Exam: Blood pressure 134/72, pulse 71, height 5' (1.524 m), weight 62.8 kg (138 lb 6.4 oz), SpO2 96 %. Gen:      No acute distress HEENT:  EOMI, sclera anicteric Neck:     No masses; no thyromegaly Lungs:    Clear to auscultation bilaterally; normal respiratory effort CV:         Regular rate and rhythm; no murmurs Abd:      + bowel sounds; soft, non-tender; no palpable masses, no distension Ext:    No edema; adequate peripheral perfusion Skin:      Warm and dry; no rash Neuro: alert and oriented x 3 Psych: normal mood and affect  Data Reviewed: Chest x-ray 03/31/16-no acute cardiopulmonary abnormality. Images  reviewed  FENO 05/27/17- 5  Assessment:  Mild persistent asthma She continues on Arnuity and albuterol as needed I feel she can come off her controller medication as symptoms are stable.  However she like to continue it as she is worried about exacerbation and lives far from any medical facility.  Reevaluate in 1 year  Plan/Recommendations: - Continue arnuity  Follow up in 1 year  Chilton Greathouse MD [facility] Pager [phone] 11/18/2018, 2:45 PM  CC: Junie Spencer, FNP

## 2018-11-18 NOTE — Patient Instructions (Signed)
Continue the inhaler for now.  I would not stop it as you are planning to get foot surgery After the surgery we can reassess and then determine if we can get you off arnuity Follow-up in 173-month

## 2018-11-18 NOTE — Progress Notes (Signed)
[name]    [id number]    1941/06/03  Primary Care Physician:Hawks, Edilia Bo, FNP  Referring Physician: Junie Spencer, FNP [address] [place], [place] [address]  Chief complaint:   Follow-up from mild persistent asthma.  HPI: [name] is a 78 year old with mild persistent asthma. She is a former patient of Dr. Delford Field. Her symptoms are well controlled on steroid inhaler. She does not need to use her albuterol rescue inhaler. She was treated for flu A infection with tamiflu in march 2017.  Qvar has been changed to arnuity in Jan 2018 due to insurance reasons. His symptoms of asthma has remained stable on it.  Interim History: Continues on Arnuity with no issues.  Has mild dyspnea on exertion.  No cough, sputum production, wheezing, fevers, chills.  Outpatient Encounter Medications as of 11/18/2018  Medication Sig  . albuterol (PROAIR HFA) 108 (90 Base) MCG/ACT inhaler Inhale 2 puffs into the lungs every 6 (six) hours as needed for wheezing or shortness of breath.  Marland Kitchen aspirin EC 81 MG tablet Take 81 mg by mouth daily.    . Biotin 5000 MCG CAPS Take 5,000 mcg by mouth daily.  . Cholecalciferol (VITAMIN D-3) 1000 UNITS CAPS Take 5,000 Units by mouth daily.   Marland Kitchen levothyroxine (SYNTHROID, LEVOTHROID) 88 MCG tablet Take 1 tablet (88 mcg total) by mouth daily.  . meclizine (ANTIVERT) 25 MG tablet Take 1 tablet (25 mg total) by mouth 3 (three) times daily as needed for dizziness.  . Multiple Vitamins-Minerals (CENTRUM SILVER PO) Take 1 tablet by mouth daily. Reported on 12/12/2015  . ARNUITY ELLIPTA 100 MCG/ACT AEPB INHALE 1 PUFF BY MOUTH ONCE DAILY  . ibuprofen (ADVIL,MOTRIN) 200 MG tablet Take 400 mg by mouth every 6 (six) hours as needed for mild pain.   No facility-administered encounter medications on file as of 11/18/2018.     Allergies as of 11/18/2018 - Review Complete 11/18/2018  Allergen Reaction Noted  . Sulfonamide derivatives  12/15/2007    Past  Medical History:  Diagnosis Date  . Asthma   . Gallstone pancreatitis   . Hypothyroidism   . Osteoporosis   . Postinflammatory pulmonary fibrosis (HCC)   . Vertigo     Past Surgical History:  Procedure Laterality Date  . CATARACT EXTRACTION W/PHACO  10/01/2011   Procedure: CATARACT EXTRACTION PHACO AND INTRAOCULAR LENS PLACEMENT (IOC);  Surgeon: Gemma Payor;  Location: [facility];  Service: Ophthalmology;  Laterality: Left;  CDE:13.74  . CATARACT EXTRACTION W/PHACO  10/22/2011   Procedure: CATARACT EXTRACTION PHACO AND INTRAOCULAR LENS PLACEMENT (IOC);  Surgeon: Gemma Payor;  Location: [facility];  Service: Ophthalmology;  Laterality: Right;  CDE:9.98  . CHOLECYSTECTOMY  2010  . TUBAL LIGATION      Family History  Problem Relation Age of Onset  . Heart failure Mother   . Emphysema Mother   . Other Father        blood clot in brain  . Breast cancer Sister   . Cancer Sister        breast  . Breast cancer Sister   . Cancer Sister        breast  . Asthma Sister   . Cancer Brother        lung  . Anesthesia problems Neg Hx   . Hypotension Neg Hx   . Malignant hyperthermia Neg Hx   . Pseudochol deficiency Neg Hx     Social History   Socioeconomic  History  . Marital status: Married    Spouse name: Not on file  . Number of children: 3  . Years of education: Not on file  . Highest education level: Not on file  Occupational History  . Occupation: retired Scientist, product/process developmenttextile worker  Social Needs  . Financial resource strain: Not on file  . Food insecurity:    Worry: Not on file    Inability: Not on file  . Transportation needs:    Medical: Not on file    Non-medical: Not on file  Tobacco Use  . Smoking status: Never Smoker  . Smokeless tobacco: Never Used  Substance and Sexual Activity  . Alcohol use: No  . Drug use: No  . Sexual activity: Not on file  Lifestyle  . Physical activity:    Days per week: Not on file    Minutes per session: Not on file  . Stress: Not on file    Relationships  . Social connections:    Talks on phone: Not on file    Gets together: Not on file    Attends religious service: Not on file    Active member of club or organization: Not on file    Attends meetings of clubs or organizations: Not on file    Relationship status: Not on file  . Intimate partner violence:    Fear of current or ex partner: Not on file    Emotionally abused: Not on file    Physically abused: Not on file    Forced sexual activity: Not on file  Other Topics Concern  . Not on file  Social History Narrative  . Not on file   Review of systems: Review of Systems  Constitutional: Negative for fever and chills.  HENT: Negative.   Eyes: Negative for blurred vision.  Respiratory: as per HPI  Cardiovascular: Negative for chest pain and palpitations.  Gastrointestinal: Negative for vomiting, diarrhea, blood per rectum. Genitourinary: Negative for dysuria, urgency, frequency and hematuria.  Musculoskeletal: Negative for myalgias, back pain and joint pain.  Skin: Negative for itching and rash.  Neurological: Negative for dizziness, tremors, focal weakness, seizures and loss of consciousness.  Endo/Heme/Allergies: Negative for environmental allergies.  Psychiatric/Behavioral: Negative for depression, suicidal ideas and hallucinations.  All other systems reviewed and are negative.  Physical Exam: Blood pressure 136/80, pulse 71, height 5\' 2"  (1.575 m), weight 137 lb 6.4 oz (62.3 kg), SpO2 97 %. Gen:      No acute distress HEENT:  EOMI, sclera anicteric Neck:     No masses; no thyromegaly Lungs:    Clear to auscultation bilaterally; normal respiratory effort CV:         Regular rate and rhythm; no murmurs Abd:      + bowel sounds; soft, non-tender; no palpable masses, no distension Ext:    No edema; adequate peripheral perfusion Skin:      Warm and dry; no rash Neuro: alert and oriented x 3 Psych: normal mood and affect  Data Reviewed: Chest x-ray 03/31/16-no  acute cardiopulmonary abnormality. Images reviewed  FENO 05/27/17- 5  Assessment:  Mild persistent asthma Continues on Arnuity  I feel she can come off her controller medication as symptoms are stable.   However we will not make any changes to the inhaler medications for now as she is planning a foot surgery for bunions and we do not want to risk exacerbation before the procedure  Return to clinic in 6 months and reassess  Plan/Recommendations: - Continue Arnuity  Follow up in 6 months  Chilton Greathouse MD  [facility] Pager [phone] 11/18/2018, 2:42 PM  CC: Junie Spencer, FNP

## 2018-11-22 ENCOUNTER — Other Ambulatory Visit: Payer: Self-pay | Admitting: Pulmonary Disease

## 2018-12-11 ENCOUNTER — Other Ambulatory Visit: Payer: Self-pay | Admitting: Pulmonary Disease

## 2018-12-13 ENCOUNTER — Ambulatory Visit (INDEPENDENT_AMBULATORY_CARE_PROVIDER_SITE_OTHER): Payer: Medicare Other | Admitting: Physician Assistant

## 2018-12-13 ENCOUNTER — Encounter: Payer: Self-pay | Admitting: Physician Assistant

## 2018-12-13 VITALS — BP 116/68 | HR 80 | Temp 97.6°F | Ht 62.0 in | Wt 136.6 lb

## 2018-12-13 DIAGNOSIS — R52 Pain, unspecified: Secondary | ICD-10-CM | POA: Diagnosis not present

## 2018-12-13 DIAGNOSIS — J069 Acute upper respiratory infection, unspecified: Secondary | ICD-10-CM

## 2018-12-13 LAB — VERITOR FLU A/B WAIVED
INFLUENZA A: NEGATIVE
Influenza B: NEGATIVE

## 2018-12-13 NOTE — Patient Instructions (Signed)
-   Take meds as prescribed - Use a cool mist humidifier  -Use saline nose sprays frequently -Force fluids -For any cough or congestion  Use plain Mucinex- regular strength or max strength is fine -For fever or aces or pains- take tylenol or ibuprofen. -Throat lozenges if help -New toothbrush in 3 days     

## 2018-12-14 ENCOUNTER — Other Ambulatory Visit: Payer: Self-pay | Admitting: Pulmonary Disease

## 2018-12-15 NOTE — Progress Notes (Signed)
BP 116/68   Pulse 80   Temp 97.6 F (36.4 C) (Oral)   Ht 5\' 2"  (1.575 m)   Wt 136 lb 9.6 oz (62 kg)   BMI 24.98 kg/m    Subjective:    Patient ID: [name], female    DOB: 07-21-1941, 78 y.o.   MRN: [id number]  HPI: [name] is a 78 y.o. female presenting on 12/13/2018 for Cough; sneezing; and Nasal Congestion  This patient has had many days of sore throat and postnasal drainage, headache at times and sinus pressure. There is copious drainage at times. Denies any fever at this time. There has been a history of sinus infections in the past.  There is cough at night. It has become more prevalent in recent days.   Past Medical History:  Diagnosis Date  . Asthma   . Gallstone pancreatitis   . Hypothyroidism   . Osteoporosis   . Postinflammatory pulmonary fibrosis (HCC)   . Vertigo    Relevant past medical, surgical, family and social history reviewed and updated as indicated. Interim medical history since our last visit reviewed. Allergies and medications reviewed and updated. DATA REVIEWED: CHART IN EPIC  Family History reviewed for pertinent findings.  Review of Systems  Constitutional: Positive for appetite change, chills, fatigue and fever. Negative for activity change.  HENT: Positive for congestion, postnasal drip and sore throat.   Eyes: Negative.   Respiratory: Positive for cough. Negative for wheezing.   Cardiovascular: Negative.  Negative for chest pain, palpitations and leg swelling.  Gastrointestinal: Negative.   Genitourinary: Negative.   Musculoskeletal: Positive for myalgias.  Skin: Negative.   Neurological: Positive for headaches.    Allergies as of 12/13/2018      Reactions   Sulfonamide Derivatives       Medication List       Accurate as of December 13, 2018 11:59 PM. Always use your most recent med list.        albuterol 108 (90 Base) MCG/ACT inhaler Commonly known as:  PROAIR HFA Inhale 2 puffs into the lungs every 6 (six)  hours as needed for wheezing or shortness of breath.   ARNUITY ELLIPTA 100 MCG/ACT Aepb Generic drug:  Fluticasone Furoate INHALE 1 PUFF BY MOUTH ONCE DAILY   aspirin EC 81 MG tablet Take 81 mg by mouth daily.   Biotin 5000 MCG Caps Take 5,000 mcg by mouth daily.   CENTRUM SILVER PO Take 1 tablet by mouth daily. Reported on 12/12/2015   ibuprofen 200 MG tablet Commonly known as:  ADVIL,MOTRIN Take 400 mg by mouth every 6 (six) hours as needed for mild pain.   levothyroxine 88 MCG tablet Commonly known as:  SYNTHROID, LEVOTHROID Take 1 tablet (88 mcg total) by mouth daily.   meclizine 25 MG tablet Commonly known as:  ANTIVERT Take 1 tablet (25 mg total) by mouth 3 (three) times daily as needed for dizziness.   Vitamin D-3 25 MCG (1000 UT) Caps Take 5,000 Units by mouth daily.          Objective:    BP 116/68   Pulse 80   Temp 97.6 F (36.4 C) (Oral)   Ht 5\' 2"  (1.575 m)   Wt 136 lb 9.6 oz (62 kg)   BMI 24.98 kg/m   Allergies  Allergen Reactions  . Sulfonamide Derivatives     Wt Readings from Last 3 Encounters:  12/13/18 136 lb 9.6 oz (62 kg)  11/18/18 137 lb 6.4 oz (  62.3 kg)  09/20/18 137 lb 3.2 oz (62.2 kg)    Physical Exam Vitals signs and nursing note reviewed.  Constitutional:      Appearance: She is well-developed.  HENT:     Head: Normocephalic and atraumatic.     Right Ear: A middle ear effusion is present.     Left Ear: A middle ear effusion is present.     Nose: Mucosal edema present.     Right Sinus: No frontal sinus tenderness.     Left Sinus: No frontal sinus tenderness.     Mouth/Throat:     Pharynx: Posterior oropharyngeal erythema present. No oropharyngeal exudate.     Tonsils: No tonsillar abscesses.  Eyes:     Conjunctiva/sclera: Conjunctivae normal.     Pupils: Pupils are equal, round, and reactive to light.  Neck:     Musculoskeletal: Normal range of motion.  Cardiovascular:     Rate and Rhythm: Normal rate and regular rhythm.      Heart sounds: Normal heart sounds.  Pulmonary:     Effort: Pulmonary effort is normal.     Breath sounds: Normal breath sounds.  Abdominal:     General: Bowel sounds are normal.     Palpations: Abdomen is soft.  Skin:    General: Skin is warm and dry.     Findings: No rash.  Neurological:     Mental Status: She is alert and oriented to person, place, and time.     Deep Tendon Reflexes: Reflexes are normal and symmetric.  Psychiatric:        Behavior: Behavior normal.        Thought Content: Thought content normal.        Judgment: Judgment normal.     Results for orders placed or performed in visit on 12/13/18  Veritor Flu A/B Waived  Result Value Ref Range   Influenza A Negative Negative   Influenza B Negative Negative      Assessment & Plan:   1. Body aches - Veritor Flu A/B Waived  2. Viral upper respiratory tract infection Supportive care   Continue all other maintenance medications as listed above.  Follow up plan: No follow-ups on file.  Educational handout given for survey  Remus Loffler PA-C [facility] [address]  [place], [place] [address] [phone]   12/15/2018, 2:34 PM

## 2019-01-09 ENCOUNTER — Other Ambulatory Visit: Payer: Self-pay | Admitting: Pulmonary Disease

## 2019-02-06 ENCOUNTER — Other Ambulatory Visit: Payer: Self-pay | Admitting: Pulmonary Disease

## 2019-03-27 ENCOUNTER — Encounter: Payer: Self-pay | Admitting: Family

## 2019-03-27 ENCOUNTER — Other Ambulatory Visit: Payer: Self-pay

## 2019-03-27 ENCOUNTER — Ambulatory Visit (INDEPENDENT_AMBULATORY_CARE_PROVIDER_SITE_OTHER): Payer: Medicare Other | Admitting: Family

## 2019-03-27 DIAGNOSIS — J4540 Moderate persistent asthma, uncomplicated: Secondary | ICD-10-CM

## 2019-03-27 DIAGNOSIS — M810 Age-related osteoporosis without current pathological fracture: Secondary | ICD-10-CM | POA: Diagnosis not present

## 2019-03-27 DIAGNOSIS — E663 Overweight: Secondary | ICD-10-CM

## 2019-03-27 DIAGNOSIS — E559 Vitamin D deficiency, unspecified: Secondary | ICD-10-CM

## 2019-03-27 DIAGNOSIS — E039 Hypothyroidism, unspecified: Secondary | ICD-10-CM

## 2019-03-27 DIAGNOSIS — E785 Hyperlipidemia, unspecified: Secondary | ICD-10-CM

## 2019-03-27 NOTE — Progress Notes (Signed)
   Virtual Visit via telephone Note  I connected with [name] on 03/27/19 at 10:49 AM by telephone and verified that I am speaking with the correct person using two identifiers. [name] is currently located at home and no one is currently with her during visit. The provider, Jannifer Rodney, FNP is located in their office at time of visit.  I discussed the limitations, risks, security and privacy concerns of performing an evaluation and management service by telephone and the availability of in person appointments. I also discussed with the patient that there may be a patient responsible charge related to this service. The patient expressed understanding and agreed to proceed.   History and Present Illness:  PT calls the office today for chronic follow up Thyroid Problem  Presents for follow-up visit. Symptoms include fatigue. Patient reports no constipation, diaphoresis, diarrhea or dry skin. The symptoms have been stable. Her past medical history is significant for hyperlipidemia.  Asthma  There is no cough, frequent throat clearing or wheezing. This is a chronic problem. The current episode started more than 1 year ago. Her past medical history is significant for asthma.  Hyperlipidemia  This is a chronic problem. The current episode started more than 1 year ago. The problem is uncontrolled. Recent lipid tests were reviewed and are high. Current antihyperlipidemic treatment includes diet change. The current treatment provides moderate improvement of lipids.  Osteoporosis  Last Dexa scan 09/20/18. Takes Vit D daily.    Review of Systems  Constitutional: Positive for fatigue. Negative for diaphoresis.  Respiratory: Negative for cough and wheezing.   Gastrointestinal: Negative for constipation and diarrhea.     Observations/Objective: States her BP this AM was 129/84, No SOB or distress noted  Assessment and Plan: Klani P Gearing comes in today with chief complaint of No  chief complaint on file.   Diagnosis and orders addressed:  1. Moderate persistent asthma in adult without complication  2. Osteoporosis, unspecified osteoporosis type, unspecified pathological fracture presence  3. Overweight (BMI 25.0-29.9)  4. Vitamin D deficiency  5. Hypothyroidism, unspecified type  6. Hyperlipidemia, unspecified hyperlipidemia type    Labs reviewed Health Maintenance reviewed Diet and exercise encouraged  Follow up plan: 4 months       I discussed the assessment and treatment plan with the patient. The patient was provided an opportunity to ask questions and all were answered. The patient agreed with the plan and demonstrated an understanding of the instructions.   The patient was advised to call back or seek an in-person evaluation if the symptoms worsen or if the condition fails to improve as anticipated.  The above assessment and management plan was discussed with the patient. The patient verbalized understanding of and has agreed to the management plan. Patient is aware to call the clinic if symptoms persist or worsen. Patient is aware when to return to the clinic for a follow-up visit. Patient educated on when it is appropriate to go to the emergency department.   Time call ended:  11:00 AM  I provided 11 minutes of non-face-to-face time during this encounter.    Jannifer Rodney, FNP

## 2019-05-02 ENCOUNTER — Other Ambulatory Visit: Payer: Self-pay

## 2019-05-02 ENCOUNTER — Ambulatory Visit: Payer: Medicare Other | Admitting: Pulmonary Disease

## 2019-05-02 ENCOUNTER — Ambulatory Visit (INDEPENDENT_AMBULATORY_CARE_PROVIDER_SITE_OTHER): Payer: Medicare Other | Admitting: Pulmonary Disease

## 2019-05-02 ENCOUNTER — Encounter: Payer: Self-pay | Admitting: Pulmonary Disease

## 2019-05-02 VITALS — BP 120/70 | HR 61 | Temp 98.3°F | Ht 60.0 in | Wt 137.6 lb

## 2019-05-02 DIAGNOSIS — J4530 Mild persistent asthma, uncomplicated: Secondary | ICD-10-CM | POA: Diagnosis not present

## 2019-05-02 MED ORDER — ALBUTEROL SULFATE HFA 108 (90 BASE) MCG/ACT IN AERS: 2.0000 | INHALATION_SPRAY | Freq: Four times a day (QID) | RESPIRATORY_TRACT | 11 refills | Status: DC | PRN

## 2019-05-02 NOTE — Addendum Note (Signed)
Addended by: Hildred Alamin I on: 05/02/2019 10:47 AM   Modules accepted: Orders

## 2019-05-02 NOTE — Progress Notes (Signed)
         [name]    [id number]    July 03, 1941  Primary Care Physician:Hawks, Theador Hawthorne, FNP  Referring Physician: Sharion Adkins, [name],   [id number]  Chief complaint:   Follow-up from mild asthma.  HPI: [name] is a 78 year old with mild persistent asthma. She is a former patient of Dr. Joya Adkins. Her symptoms are well controlled on steroid inhaler. She does not need to use her albuterol rescue inhaler. She was treated for flu A infection with tamiflu in march 2017.  Qvar has been changed to arnuity in Jan 2018 due to insurance reasons. His symptoms of asthma has remained stable on it.  Interim History: Continues on Arnuity with no issues.  Symptoms are well controlled.  Has not had to use her albuterol rescue inhaler  ACQ-6 05/02/19- 0  Outpatient Encounter Medications as of 05/02/2019  Medication Sig  . albuterol (PROAIR HFA) 108 (90 Base) MCG/ACT inhaler Inhale 2 puffs into the lungs every 6 (six) hours as needed for wheezing or shortness of breath.  . ARNUITY ELLIPTA 100 MCG/ACT AEPB Inhale 1 puff by mouth once daily  . aspirin EC 81 MG tablet Take 81 mg by mouth daily.    . Biotin 5000 MCG CAPS Take 5,000 mcg by mouth daily.  . Cholecalciferol (VITAMIN D-3) 1000 UNITS CAPS Take 5,000 Units by mouth daily.   Marland Kitchen ibuprofen (ADVIL,MOTRIN) 200 MG tablet Take 400 mg by mouth every 6 (six) hours as needed for mild pain.  Marland Kitchen levothyroxine (SYNTHROID, LEVOTHROID) 88 MCG tablet Take 1 tablet (88 mcg total) by mouth daily.  . Multiple Vitamins-Minerals (CENTRUM SILVER PO) Take 1 tablet by mouth daily. Reported on 12/12/2015   No facility-administered encounter medications on file as of 05/02/2019.    Physical Exam: Blood pressure 120/70, pulse 61, temperature 98.3 F (36.8 C), temperature source Oral, height 5' (1.524 m), weight 137 lb 9.6 oz (62.4 kg), SpO2 98 %. Gen:      No acute distress HEENT:  EOMI, sclera anicteric Neck:     No masses; no  thyromegaly Lungs:    Clear to auscultation bilaterally; normal respiratory effort CV:         Regular rate and rhythm; no murmurs Abd:      + bowel sounds; soft, non-tender; no palpable masses, no distension Ext:    No edema; adequate peripheral perfusion Skin:      Warm and dry; no rash Neuro: alert and oriented x 3 Psych: normal mood and affect  Data Reviewed: Chest x-ray 03/31/16-no acute cardiopulmonary abnormality. Images reviewed  FENO 05/27/17- 5  Assessment:  Mild persistent asthma I feel she can come off her controller medication as symptoms are minimal Stop Advair P and monitor symptoms Keep albuterol as needed as rescue inhaler.  Return to clinic in 3 months and reassess  Plan/Recommendations: - Stop Arnuity - Albuterol PRN  Gabrielle Garfinkel MD [facility] Pager [phone] 05/02/2019, 10:12 AM  CC: Gabrielle Balloon, FNP

## 2019-05-02 NOTE — Patient Instructions (Signed)
I am glad you are doing well with your breathing. We will stop the Arnuity inhaler and monitor your symptoms. If your symptoms start getting worse then give [place] a call and we will discuss about restarting the therapy. Continue with albuterol as needed Follow-up in 3 months

## 2019-05-04 ENCOUNTER — Other Ambulatory Visit: Payer: Self-pay

## 2019-05-04 ENCOUNTER — Ambulatory Visit (INDEPENDENT_AMBULATORY_CARE_PROVIDER_SITE_OTHER): Payer: Medicare Other | Admitting: Family Medicine

## 2019-05-04 ENCOUNTER — Encounter: Payer: Self-pay | Admitting: Family Medicine

## 2019-05-04 VITALS — BP 139/79 | HR 85 | Temp 97.1°F | Ht 60.0 in | Wt 137.2 lb

## 2019-05-04 DIAGNOSIS — Z7689 Persons encountering health services in other specified circumstances: Secondary | ICD-10-CM | POA: Diagnosis not present

## 2019-05-04 DIAGNOSIS — S30860A Insect bite (nonvenomous) of lower back and pelvis, initial encounter: Secondary | ICD-10-CM

## 2019-05-04 DIAGNOSIS — W57XXXA Bitten or stung by nonvenomous insect and other nonvenomous arthropods, initial encounter: Secondary | ICD-10-CM

## 2019-05-04 MED ORDER — DOXYCYCLINE HYCLATE 100 MG PO CAPS: 100.0000 mg | ORAL_CAPSULE | Freq: Two times a day (BID) | ORAL | 0 refills | Status: DC

## 2019-05-04 NOTE — Progress Notes (Signed)
Chief Complaint  Patient presents with  . tick bite 2 weeks ago    back    HPI  Patient presents today for mild erythema on the back at the site where a tick was removed 2 weeks ago. She is concerned that is Lyme's or RMSF. The tick was a deer tick based on size description. It was noted due to itching. It was not engorged. No estimate of how long it had been attached. Pt. Denies fever. No known rash. No arthralgia.  PMH: Smoking status noted ROS: Per HPI  Objective: BP 139/79   Pulse 85   Temp (!) 97.1 F (36.2 C) (Oral)   Ht 5' (1.524 m)   Wt 137 lb 3.2 oz (62.2 kg)   BMI 26.80 kg/m  Gen: NAD, alert, cooperative with exam HEENT: NCAT, EOMI, PERRL CV: RRR, good S1/S2, no murmur Resp: CTABL, no wheezes, non-labored Skin. 6 mm erythema. Slightly raised. Not fluctuant. Mildly indurated. Located to left of midline between shoulder blades.  Ext: No edema, warm Neuro: Alert and oriented, No gross deficits  Assessment and plan:  1. Tick bite, initial encounter     Meds ordered this encounter  Medications  . doxycycline (VIBRAMYCIN) 100 MG capsule    Sig: Take 1 capsule (100 mg total) by mouth 2 (two) times daily.    Dispense:  20 capsule    Refill:  0    Orders Placed This Encounter  Procedures  . Lyme Ab/Western Blot Reflex  . Rocky mtn spotted fvr abs pnl(IgG+IgM)  . CBC with Differential/Platelet    Follow up as needed.  Claretta Fraise, MD

## 2019-05-08 LAB — CBC WITH DIFFERENTIAL/PLATELET
Basophils Absolute: 0 10*3/uL (ref 0.0–0.2)
Basos: 1 %
EOS (ABSOLUTE): 0.2 10*3/uL (ref 0.0–0.4)
Eos: 3 %
Hematocrit: 42.6 % (ref 34.0–46.6)
Hemoglobin: 13.6 g/dL (ref 11.1–15.9)
Immature Grans (Abs): 0 10*3/uL (ref 0.0–0.1)
Immature Granulocytes: 0 %
Lymphocytes Absolute: 1.7 10*3/uL (ref 0.7–3.1)
Lymphs: 24 %
MCH: 29.5 pg (ref 26.6–33.0)
MCHC: 31.9 g/dL (ref 31.5–35.7)
MCV: 92 fL (ref 79–97)
Monocytes Absolute: 0.6 10*3/uL (ref 0.1–0.9)
Monocytes: 8 %
Neutrophils Absolute: 4.6 10*3/uL (ref 1.4–7.0)
Neutrophils: 64 %
Platelets: 269 10*3/uL (ref 150–450)
RBC: 4.61 x10E6/uL (ref 3.77–5.28)
RDW: 13.1 % (ref 11.7–15.4)
WBC: 7.2 10*3/uL (ref 3.4–10.8)

## 2019-05-08 LAB — ROCKY MTN SPOTTED FVR ABS PNL(IGG+IGM)
RMSF IgG: NEGATIVE
RMSF IgM: 0.23 index (ref 0.00–0.89)

## 2019-05-08 LAB — LYME AB/WESTERN BLOT REFLEX
LYME DISEASE AB, QUANT, IGM: <0.8 index (ref 0.00–0.79)
Lyme IgG/IgM Ab: <0.91 {ISR} (ref 0.00–0.90)

## 2019-05-09 NOTE — Progress Notes (Signed)
Hello [name],  Your lab result is normal and/or stable.Some minor variations that are not significant are commonly marked abnormal, but do not represent any medical problem for you.  Best regards, Kenna Seward, M.D.

## 2019-05-12 ENCOUNTER — Encounter: Payer: Self-pay | Admitting: Family Medicine

## 2019-07-13 ENCOUNTER — Other Ambulatory Visit: Payer: Self-pay

## 2019-07-14 ENCOUNTER — Ambulatory Visit (INDEPENDENT_AMBULATORY_CARE_PROVIDER_SITE_OTHER): Payer: Medicare Other

## 2019-07-14 DIAGNOSIS — Z23 Encounter for immunization: Secondary | ICD-10-CM

## 2019-07-26 ENCOUNTER — Other Ambulatory Visit: Payer: Self-pay

## 2019-07-27 ENCOUNTER — Encounter: Payer: Self-pay | Admitting: Family

## 2019-07-27 ENCOUNTER — Ambulatory Visit (INDEPENDENT_AMBULATORY_CARE_PROVIDER_SITE_OTHER): Payer: Medicare Other | Admitting: Family

## 2019-07-27 VITALS — BP 129/77 | HR 68 | Temp 96.8°F | Ht 60.0 in | Wt 135.8 lb

## 2019-07-27 DIAGNOSIS — E039 Hypothyroidism, unspecified: Secondary | ICD-10-CM

## 2019-07-27 DIAGNOSIS — E785 Hyperlipidemia, unspecified: Secondary | ICD-10-CM

## 2019-07-27 DIAGNOSIS — R413 Other amnesia: Secondary | ICD-10-CM

## 2019-07-27 DIAGNOSIS — E663 Overweight: Secondary | ICD-10-CM

## 2019-07-27 DIAGNOSIS — J4540 Moderate persistent asthma, uncomplicated: Secondary | ICD-10-CM

## 2019-07-27 DIAGNOSIS — E559 Vitamin D deficiency, unspecified: Secondary | ICD-10-CM | POA: Diagnosis not present

## 2019-07-27 MED ORDER — NAMZARIC 7 & 14 & 21 &28 -10 MG PO C4PK: 1.0000 | EXTENDED_RELEASE_CAPSULE | Freq: Every day | ORAL | 0 refills | Status: DC

## 2019-07-27 NOTE — Patient Instructions (Signed)

## 2019-07-27 NOTE — Progress Notes (Signed)
Subjective:    Patient ID: [name], female    DOB: 1941/07/15, 78 y.o.   MRN: [id number]  Chief Complaint  Patient presents with  . Medical Management of Chronic Issues   Pt presents to the office today for chronic follow up. Pt is followed by Pulmonologist's annually. Pt states her son has noticed a decrease in her memory and would like a neurologists referral. She reports she is more forgetful at times and can not remember things people tell her.  Thyroid Problem Presents for follow-up visit. Patient reports no constipation, depressed mood or fatigue. The symptoms have been stable. Her past medical history is significant for hyperlipidemia.  Asthma There is no cough or wheezing. This is a chronic problem. The current episode started more than 1 year ago. The problem occurs intermittently. The problem has been resolved. She reports moderate improvement on treatment. Her past medical history is significant for asthma.  Hyperlipidemia This is a chronic problem. The current episode started more than 1 year ago. The problem is uncontrolled. Recent lipid tests were reviewed and are high. Exacerbating diseases include obesity. Current antihyperlipidemic treatment includes diet change. The current treatment provides moderate improvement of lipids. Risk factors for coronary artery disease include dyslipidemia, hypertension and a sedentary lifestyle.      Review of Systems  Constitutional: Negative for fatigue.  Respiratory: Negative for cough and wheezing.   Gastrointestinal: Negative for constipation.  All other systems reviewed and are negative.      Objective:   Physical Exam Vitals signs reviewed.  Constitutional:      General: She is not in acute distress.    Appearance: She is well-developed.  HENT:     Head: Normocephalic and atraumatic.     Right Ear: Tympanic membrane normal.     Left Ear: Tympanic membrane normal.  Eyes:     Pupils: Pupils are equal, round, and  reactive to light.  Neck:     Musculoskeletal: Normal range of motion and neck supple.     Thyroid: No thyromegaly.  Cardiovascular:     Rate and Rhythm: Normal rate and regular rhythm.     Heart sounds: Normal heart sounds. No murmur.  Pulmonary:     Effort: Pulmonary effort is normal. No respiratory distress.     Breath sounds: Normal breath sounds. No wheezing.  Abdominal:     General: Bowel sounds are normal. There is no distension.     Palpations: Abdomen is soft.     Tenderness: There is no abdominal tenderness.  Musculoskeletal: Normal range of motion.        General: No tenderness.  Skin:    General: Skin is warm and dry.  Neurological:     Mental Status: She is alert and oriented to person, place, and time.     Cranial Nerves: No cranial nerve deficit.     Deep Tendon Reflexes: Reflexes are normal and symmetric.  Psychiatric:        Behavior: Behavior normal.        Thought Content: Thought content normal.        Judgment: Judgment normal.       BP 129/77   Pulse 68   Temp (!) 96.8 F (36 C) (Temporal)   Ht 5' (1.524 m)   Wt 135 lb 12.8 oz (61.6 kg)   SpO2 98%   BMI 26.52 kg/m      Assessment & Plan:  [name] comes in today with chief complaint of  Medical Management of Chronic Issues   Diagnosis and orders addressed:  1. Moderate persistent asthma in adult without complication - CBC with Differential/Platelet - CMP14+EGFR  2. Hyperlipidemia, unspecified hyperlipidemia type - CBC with Differential/Platelet - CMP14+EGFR - Lipid panel  3. Overweight (BMI 25.0-29.9) - CBC with Differential/Platelet - CMP14+EGFR  4. Hypothyroidism, unspecified type - CBC with Differential/Platelet - CMP14+EGFR - TSH  5. Vitamin D deficiency - CBC with Differential/Platelet - CMP14+EGFR - VITAMIN D 25 Hydroxy (Vit-D Deficiency, Fractures)  6. Memory problem MMSE - Mini Mental State Exam 07/27/2019  Orientation to time 5  Orientation to Place 5   Registration 3  Attention/ Calculation 3  Recall 3  Language- name 2 objects 2  Language- repeat 1  Language- follow 3 step command 3  Language- read & follow direction 1  Write a sentence 1  Copy design 1  Total score 28    On exam her MMSE is ok today, she is very anxious about her memory. She is requesting neurologists referral.  Encouraged writing notes - Ambulatory referral to Neurology - Memantine HCl-Donepezil HCl (NAMZARIC) 7 & 14 & 21 &28 -10 MG C4PK; Take 1 tablet by mouth daily.  Dispense: 28 each; Refill: 0   Labs pending Health Maintenance reviewed Diet and exercise encouraged  Follow up plan: 3 months    Evelina Dun, FNP

## 2019-07-28 ENCOUNTER — Other Ambulatory Visit: Payer: Self-pay | Admitting: Family

## 2019-07-28 LAB — VITAMIN D 25 HYDROXY (VIT D DEFICIENCY, FRACTURES): Vit D, 25-Hydroxy: 68 ng/mL (ref 30.0–100.0)

## 2019-07-28 LAB — CBC WITH DIFFERENTIAL/PLATELET
Basophils Absolute: 0.1 10*3/uL (ref 0.0–0.2)
Basos: 1 %
EOS (ABSOLUTE): 0.2 10*3/uL (ref 0.0–0.4)
Eos: 3 %
Hematocrit: 39.8 % (ref 34.0–46.6)
Hemoglobin: 13.5 g/dL (ref 11.1–15.9)
Immature Grans (Abs): 0 10*3/uL (ref 0.0–0.1)
Immature Granulocytes: 0 %
Lymphocytes Absolute: 1.4 10*3/uL (ref 0.7–3.1)
Lymphs: 22 %
MCH: 30.3 pg (ref 26.6–33.0)
MCHC: 33.9 g/dL (ref 31.5–35.7)
MCV: 89 fL (ref 79–97)
Monocytes Absolute: 0.5 10*3/uL (ref 0.1–0.9)
Monocytes: 8 %
Neutrophils Absolute: 4.1 10*3/uL (ref 1.4–7.0)
Neutrophils: 66 %
Platelets: 261 10*3/uL (ref 150–450)
RBC: 4.45 x10E6/uL (ref 3.77–5.28)
RDW: 13 % (ref 11.7–15.4)
WBC: 6.2 10*3/uL (ref 3.4–10.8)

## 2019-07-28 LAB — CMP14+EGFR
ALT: 16 IU/L (ref 0–32)
AST: 20 IU/L (ref 0–40)
Albumin/Globulin Ratio: 1.7 (ref 1.2–2.2)
Albumin: 4.5 g/dL (ref 3.7–4.7)
Alkaline Phosphatase: 65 IU/L (ref 39–117)
BUN/Creatinine Ratio: 27 (ref 12–28)
BUN: 18 mg/dL (ref 8–27)
Bilirubin Total: 0.3 mg/dL (ref 0.0–1.2)
CO2: 22 mmol/L (ref 20–29)
Calcium: 10.3 mg/dL (ref 8.7–10.3)
Chloride: 104 mmol/L (ref 96–106)
Creatinine, Ser: 0.67 mg/dL (ref 0.57–1.00)
GFR calc Af Amer: 98 mL/min/{1.73_m2} (ref >59)
GFR calc non Af Amer: 85 mL/min/{1.73_m2} (ref >59)
Globulin, Total: 2.6 g/dL (ref 1.5–4.5)
Glucose: 91 mg/dL (ref 65–99)
Potassium: 4.3 mmol/L (ref 3.5–5.2)
Sodium: 142 mmol/L (ref 134–144)
Total Protein: 7.1 g/dL (ref 6.0–8.5)

## 2019-07-28 LAB — LIPID PANEL
Chol/HDL Ratio: 3.9 ratio (ref 0.0–4.4)
Cholesterol, Total: 183 mg/dL (ref 100–199)
HDL: 47 mg/dL (ref >39)
LDL Chol Calc (NIH): 115 mg/dL — ABNORMAL HIGH (ref 0–99)
Triglycerides: 114 mg/dL (ref 0–149)
VLDL Cholesterol Cal: 21 mg/dL (ref 5–40)

## 2019-07-28 LAB — TSH: TSH: 0.812 u[IU]/mL (ref 0.450–4.500)

## 2019-07-28 MED ORDER — ATORVASTATIN CALCIUM 20 MG PO TABS: 20.0000 mg | ORAL_TABLET | Freq: Every day | ORAL | 3 refills | Status: DC

## 2019-07-31 NOTE — Progress Notes (Signed)
[facility]    Provider:  Dr Lucia GaskinsAhern Requesting Provider: Junie SpencerHawks, Christy A, FNP Primary Care Provider:  Junie SpencerHawks, Christy A, FNP  CC:  Cognitive difficulties  HPI:  [name] is a 78 y.o. female here as requested by Junie SpencerHawks, Christy A, FNP for memory problems.  Past medical history asthma, hyperlipidemia, overweight, hypothyroidism, vitamin D deficiency, memory problem. Here with her son who also provides information. She says she forgets things, she tries to write things done and she loses the paper and finds whatever she is looking for, started at least a year ago per son. She has a form of ADD and son thought she was forgetful due to this but noticed it more a year ago. Son says they spent the day together and they went to [employer], she paid him for groceries and then forgot and says again she needed to pay him in the same day. She forgets appointments, has to tell patient multiple times in the same day about things, more short-term memory, she lives with her husband and he is in ICU and the last 2 weeks it has been worse since she is going to the hospital and there is stress. Short-term memory progressive. Her energy is poor. Anxiety is worsening but memory changes started prior to this increased anxiety. She is paying all the bills now, husband helps her and she writes it down, not missing any bills and doing well, son says she has double paid even up to 2 years ago bc she forget she already paid.She is able to take medications, not missing any medications, no difficulties driving, she says she was lost once. But she has ADD and her brain "wanders off". No other focal neurologic deficits, associated symptoms, inciting events or modifiable factors. Sister had Alzheimer's, died at 6290 and had it 15 years.   Reviewed notes, labs and imaging from outside physicians, which showed:   I reviewed Marin ShutterChristie Hawks' notes.  Patient very anxious about her memory.  Patient requested a  neurology referral.  She was encouraged to write notes.  She was started on memantine and donepezil (Namzaric) titration up to 28 mg memantine with 10 mg donepezil.  Mini-Mental status exam was 28 out of 30.  This was July 27, 2019.  Labs taken the same day include normal TSH, normal CBC, normal CMP, excellent vitamin D levels.  Review of Systems: Patient complains of symptoms per HPI as well as the following symptoms: memory loss. Pertinent negatives and positives per HPI. All others negative.   Social History   Socioeconomic History  . Marital status: Married    Spouse name: Not on file  . Number of children: 3  . Years of education: 6111  . Highest education level: Not on file  Occupational History  . Occupation: retired Scientist, product/process developmenttextile worker  Social Needs  . Financial resource strain: Not on file  . Food insecurity    Worry: Not on file    Inability: Not on file  . Transportation needs    Medical: Not on file    Non-medical: Not on file  Tobacco Use  . Smoking status: Never Smoker  . Smokeless tobacco: Never Used  Substance and Sexual Activity  . Alcohol use: No  . Drug use: No  . Sexual activity: Not on file  Lifestyle  . Physical activity    Days per week: Not on file    Minutes per session: Not on file  . Stress: Not on file  Relationships  .  Social Musician on phone: Not on file    Gets together: Not on file    Attends religious service: Not on file    Active member of club or organization: Not on file    Attends meetings of clubs or organizations: Not on file    Relationship status: Not on file  . Intimate partner violence    Fear of current or ex partner: Not on file    Emotionally abused: Not on file    Physically abused: Not on file    Forced sexual activity: Not on file  Other Topics Concern  . Not on file  Social History Narrative   Lives at home with husband   Right handed    Family History  Problem Relation Age of Onset  . Heart failure  Mother   . Emphysema Mother   . Other Father        blood clot in brain  . Breast cancer Sister   . Cancer Sister        breast  . Memory loss Sister   . Breast cancer Sister   . Cancer Sister        breast  . Asthma Sister   . Cancer Brother        lung  . Memory loss Sister   . Anesthesia problems Neg Hx   . Hypotension Neg Hx   . Malignant hyperthermia Neg Hx   . Pseudochol deficiency Neg Hx     Past Medical History:  Diagnosis Date  . Asthma   . Gallstone pancreatitis   . Hypothyroidism   . Osteoporosis   . Postinflammatory pulmonary fibrosis (HCC)   . Vertigo     Patient Active Problem List   Diagnosis Date Noted  . Hiatal hernia 03/09/2018  . Overweight (BMI 25.0-29.9) 08/14/2016  . Osteoporosis 12/02/2015  . Vitamin D deficiency 11/21/2015  . HLD (hyperlipidemia) 03/28/2013  . Allergic rhinitis 01/08/2010  . Hypothyroidism 07/03/2007  . Moderate persistent asthma in adult without complication 07/03/2007  . FIBROSIS, POSTINFLAMMATORY PULMONARY 07/03/2007    Past Surgical History:  Procedure Laterality Date  . CATARACT EXTRACTION W/PHACO  10/01/2011   Procedure: CATARACT EXTRACTION PHACO AND INTRAOCULAR LENS PLACEMENT (IOC);  Surgeon: Gemma Payor;  Location: [facility];  Service: Ophthalmology;  Laterality: Left;  CDE:13.74  . CATARACT EXTRACTION W/PHACO  10/22/2011   Procedure: CATARACT EXTRACTION PHACO AND INTRAOCULAR LENS PLACEMENT (IOC);  Surgeon: Gemma Payor;  Location: [facility];  Service: Ophthalmology;  Laterality: Right;  CDE:9.98  . CHOLECYSTECTOMY  2010  . TUBAL LIGATION      Current Outpatient Medications  Medication Sig Dispense Refill  . albuterol (PROAIR HFA) 108 (90 Base) MCG/ACT inhaler Inhale 2 puffs into the lungs every 6 (six) hours as needed for wheezing or shortness of breath. 18 g 11  . aspirin EC 81 MG tablet Take 81 mg by mouth daily.      Marland Kitchen atorvastatin (LIPITOR) 20 MG tablet Take 1 tablet (20 mg total) by mouth daily. 90 tablet 3  .  Biotin 5000 MCG CAPS Take 5,000 mcg by mouth daily.    . Cholecalciferol (VITAMIN D-3) 1000 UNITS CAPS Take 5,000 Units by mouth daily.     Marland Kitchen ibuprofen (ADVIL,MOTRIN) 200 MG tablet Take 400 mg by mouth every 6 (six) hours as needed for mild pain.    Marland Kitchen levothyroxine (SYNTHROID, LEVOTHROID) 88 MCG tablet Take 1 tablet (88 mcg total) by mouth daily. 90 tablet 2  .  Multiple Vitamins-Minerals (CENTRUM SILVER PO) Take 1 tablet by mouth daily. Reported on 12/12/2015    . Memantine HCl-Donepezil HCl (NAMZARIC) 7 & 14 & 21 &28 -10 MG C4PK Take 1 tablet by mouth daily. (Patient not taking: Reported on 08/01/2019) 28 each 0   No current facility-administered medications for this visit.     Allergies as of 08/01/2019 - Review Complete 08/01/2019  Allergen Reaction Noted  . Sulfonamide derivatives  12/15/2007    Vitals: BP 132/80   Pulse 72   Temp (!) 97.5 F (36.4 C) Comment: son 97.8, taken at door  Ht 5' (1.524 m)   Wt 137 lb (62.1 kg)   BMI 26.76 kg/m  Last Weight:  Wt Readings from Last 1 Encounters:  08/01/19 137 lb (62.1 kg)   Last Height:   Ht Readings from Last 1 Encounters:  08/01/19 5' (1.524 m)     Physical exam: Exam: Gen: NAD, conversant, well nourised, obese, well groomed                     CV: RRR, no MRG. No Carotid Bruits. No peripheral edema, warm, nontender Eyes: Conjunctivae clear without exudates or hemorrhage  Neuro: Detailed Neurologic Exam  Speech:    Speech is normal; fluent and spontaneous with normal comprehension.  Cognition:  MMSE - Mini Mental State Exam 08/01/2019 07/27/2019  Orientation to time 3 5  Orientation to Place 3 5  Registration 3 3  Attention/ Calculation 1 3  Recall 1 3  Language- name 2 objects 1 2  Language- repeat 0 1  Language- follow 3 step command 3 3  Language- read & follow direction 1 1  Write a sentence 1 1  Copy design 0 1  Total score 17 28       The patient is oriented to person, place, and time;     recent and  remote memory intact;     language fluent;     normal attention, concentration,     fund of knowledge Cranial Nerves:    The pupils are equal, round, and reactive to light. . Visual fields are full to finger confrontation. Extraocular movements are intact. Trigeminal sensation is intact and the muscles of mastication are normal. The face is symmetric. The palate elevates in the midline. Hearing intact. Voice is normal. Shoulder shrug is normal. The tongue has normal motion without fasciculations.   Coordination:    Normal finger to nose  Gait:    Good stride, not shuffling  Motor Observation:    No asymmetry, no atrophy, and no involuntary movements noted. Tone:    Normal muscle tone.    Posture:    Posture is normal.     Strength:    Strength is V/V in the upper and lower limbs.      Sensation: intact to LT     Reflex Exam:  DTR's:    Absent AJs otherwise deep tendon reflexes in the upper and lower extremities are brisk bilaterally.   Toes:    The toes are withdrawal bilaterally.   Clonus:    Clonus is absent.    Assessment/Plan:   78 y.o. female here as requested by Junie Spencer, FNP for memory problems.  Past medical history asthma, hyperlipidemia, overweight, hypothyroidism, vitamin D deficiency, memory problem.  MMSE several days ago was 28 out of 30 but per primary care patient is very anxious about her memory and requested neurology evaluation.Today MMSE is 17/30, likely due to anxiety  since a few days ago was 28/30. Sister with Alzheimer's. May be MCI of the alzheimer's type but given anxiety and ADD may be pseudodementia.   Evelina Dun started [employer] MRI brain and lab work for reversible causes of dementia Formal neurocognitive testing   Orders Placed This Encounter  Procedures  . MR BRAIN W WO CONTRAST  . Vitamin B1  . B12 and Folate Panel  . RPR  . Methylmalonic acid, serum  . Homocysteine  . BUN+Creat  . Ambulatory referral to Neuropsychology      Cc: Sharion Balloon, FNP,  Sarina Ill, MD  [facility] [address] [place], [place] [address]  Phone [phone] Fax [phone]

## 2019-08-01 ENCOUNTER — Other Ambulatory Visit: Payer: Self-pay

## 2019-08-01 ENCOUNTER — Ambulatory Visit: Payer: Medicare Other | Admitting: Neurology

## 2019-08-01 ENCOUNTER — Telehealth: Payer: Self-pay | Admitting: Family

## 2019-08-01 ENCOUNTER — Encounter: Payer: Self-pay | Admitting: Neurology

## 2019-08-01 ENCOUNTER — Telehealth: Payer: Self-pay | Admitting: Neurology

## 2019-08-01 VITALS — BP 132/80 | HR 72 | Temp 97.5°F | Ht 60.0 in | Wt 137.0 lb

## 2019-08-01 DIAGNOSIS — R4189 Other symptoms and signs involving cognitive functions and awareness: Secondary | ICD-10-CM | POA: Diagnosis not present

## 2019-08-01 DIAGNOSIS — R413 Other amnesia: Secondary | ICD-10-CM | POA: Diagnosis not present

## 2019-08-01 MED ORDER — NAMZARIC 7 & 14 & 21 &28 -10 MG PO C4PK: 1.0000 | EXTENDED_RELEASE_CAPSULE | Freq: Every day | ORAL | 0 refills | Status: DC

## 2019-08-01 NOTE — Telephone Encounter (Signed)
[employer] medicare order sent to [facility]. No auth they will reach out to the patient to schedule.  

## 2019-08-01 NOTE — Patient Instructions (Signed)
MRI brain Labs Formal memory testing  Memory Compensation Strategies  1. Use "WARM" strategy.  W= write it down  A= associate it  R= repeat it  M= make a mental note  2.   You can keep a Social worker.  Use a 3-ring notebook with sections for the following: calendar, important names and phone numbers,  medications, doctors' names/phone numbers, lists/reminders, and a section to journal what you did  each day.   3.    Use a calendar to write appointments down.  4.    Write yourself a schedule for the day.  This can be placed on the calendar or in a separate section of the Memory Notebook.  Keeping a  regular schedule can help memory.  5.    Use medication organizer with sections for each day or morning/evening pills.  You may need help loading it  6.    Keep a basket, or pegboard by the door.  Place items that you need to take out with you in the basket or on the pegboard.  You may also want to  include a message board for reminders.  7.    Use sticky notes.  Place sticky notes with reminders in a place where the task is performed.  For example: " turn off the  stove" placed by the stove, "lock the door" placed on the door at eye level, " take your medications" on  the bathroom mirror or by the place where you normally take your medications.  8.    Use alarms/timers.  Use while cooking to remind yourself to check on food or as a reminder to take your medicine, or as a  reminder to make a call, or as a reminder to perform another task, etc.

## 2019-08-01 NOTE — Telephone Encounter (Signed)
rx printed- son aware to come pick up.

## 2019-08-02 ENCOUNTER — Encounter: Payer: Self-pay | Admitting: Psychology

## 2019-08-03 ENCOUNTER — Other Ambulatory Visit: Payer: Self-pay

## 2019-08-03 ENCOUNTER — Encounter: Payer: Self-pay | Admitting: Pulmonary Disease

## 2019-08-03 ENCOUNTER — Ambulatory Visit: Payer: Medicare Other | Admitting: Pulmonary Disease

## 2019-08-03 VITALS — BP 116/62 | HR 66 | Temp 97.7°F | Ht 60.0 in | Wt 134.8 lb

## 2019-08-03 DIAGNOSIS — J4520 Mild intermittent asthma, uncomplicated: Secondary | ICD-10-CM

## 2019-08-03 NOTE — Patient Instructions (Signed)
Glad you are doing well with regard to your breathing Since your breathing is stable we do not need regular follow-up in the pulmonary clinic He can follow-up with your primary team and check back with [place] if there is any worsening of his symptoms  Follow-up as needed

## 2019-08-03 NOTE — Progress Notes (Signed)
         [name]    [id number]    [birth year]  Primary Care Physician:Hawks, Theador Hawthorne, FNP  Referring Physician: Sharion Balloon, [place] [place],  [place] [address]  Chief complaint:   Follow-up from mild asthma.  HPI: [name] is a 78 year old with mild asthma. She is a former patient of Dr. Joya Gaskins. Her symptoms are well controlled on steroid inhaler. She does not need to use her albuterol rescue inhaler. She was treated for flu A infection with tamiflu in march 2017.  Qvar has been changed to arnuity in Jan 2018 due to insurance reasons. His symptoms of asthma has remained stable on it.  Interim History: At last visit we had taken her off Arnuity inhaler as her symptoms are stable.  She is doing quite well with no symptoms of dyspnea, cough, wheezing. She has an albuterol rescue inhaler but has not needed to use it.  Outpatient Encounter Medications as of 08/03/2019  Medication Sig  . albuterol (PROAIR HFA) 108 (90 Base) MCG/ACT inhaler Inhale 2 puffs into the lungs every 6 (six) hours as needed for wheezing or shortness of breath.  Marland Kitchen aspirin EC 81 MG tablet Take 81 mg by mouth daily.    Marland Kitchen atorvastatin (LIPITOR) 20 MG tablet Take 1 tablet (20 mg total) by mouth daily.  . Biotin 5000 MCG CAPS Take 5,000 mcg by mouth daily.  . Cholecalciferol (VITAMIN D-3) 1000 UNITS CAPS Take 5,000 Units by mouth daily.   Marland Kitchen ibuprofen (ADVIL,MOTRIN) 200 MG tablet Take 400 mg by mouth every 6 (six) hours as needed for mild pain.  Marland Kitchen levothyroxine (SYNTHROID, LEVOTHROID) 88 MCG tablet Take 1 tablet (88 mcg total) by mouth daily.  . Memantine HCl-Donepezil HCl (NAMZARIC) 7 & 14 & 21 &28 -10 MG C4PK Take 1 tablet by mouth daily.  . Multiple Vitamins-Minerals (CENTRUM SILVER PO) Take 1 tablet by mouth daily. Reported on 12/12/2015   No facility-administered encounter medications on file as of 08/03/2019.    Physical Exam: Blood pressure 116/62, pulse 66, temperature 97.7 F  (36.5 C), temperature source Temporal, height 5' (1.524 m), weight 134 lb 12.8 oz (61.1 kg), SpO2 97 %. Gen:      No acute distress HEENT:  EOMI, sclera anicteric Neck:     No masses; no thyromegaly Lungs:    Clear to auscultation bilaterally; normal respiratory effort CV:         Regular rate and rhythm; no murmurs Abd:      + bowel sounds; soft, non-tender; no palpable masses, no distension Ext:    No edema; adequate peripheral perfusion Skin:      Warm and dry; no rash Neuro: alert and oriented x 3 Psych: normal mood and affect  Data Reviewed: Chest x-ray 03/31/16-no acute cardiopulmonary abnormality. Images reviewed  FENO 05/27/17- 5  ACQ-6 05/02/19- 0 Act score 08/03/2019-25  Assessment:  Mild intermittent asthma Currently off all controller medication Hardly needs to use her rescue inhaler  She can follow-up with [place] as needed as her symptoms are stable Discussed with patient and daughter in law in office today.  Plan/Recommendations: - Albuterol PRN Follow-up as needed  Marshell Garfinkel MD [facility] 08/03/2019, 10:09 AM  CC: Sharion Balloon, FNP

## 2019-08-03 NOTE — Progress Notes (Signed)
         [name]    [id number]    [birth year]  Primary Care Physician:Hawks, Theador Hawthorne, FNP  Referring Physician: Sharion Balloon, [place],  [place] [address]  Chief complaint:   Follow-up from mild asthma.  HPI: [name] is a 78 year old with mild persistent asthma. She is a former patient of Dr. Joya Gaskins. Her symptoms are well controlled on steroid inhaler. She does not need to use her albuterol rescue inhaler. She was treated for flu A infection with tamiflu in march 2017.  Qvar has been changed to arnuity in Jan 2018 due to insurance reasons. His symptoms of asthma has remained stable on it.  Interim History: Continues on Arnuity with no issues.  Symptoms are well controlled.  Has not had to use her albuterol rescue inhaler  ACQ-6 05/02/19- 0  Outpatient Encounter Medications as of 08/03/2019  Medication Sig  . albuterol (PROAIR HFA) 108 (90 Base) MCG/ACT inhaler Inhale 2 puffs into the lungs every 6 (six) hours as needed for wheezing or shortness of breath.  Marland Kitchen aspirin EC 81 MG tablet Take 81 mg by mouth daily.    Marland Kitchen atorvastatin (LIPITOR) 20 MG tablet Take 1 tablet (20 mg total) by mouth daily.  . Biotin 5000 MCG CAPS Take 5,000 mcg by mouth daily.  . Cholecalciferol (VITAMIN D-3) 1000 UNITS CAPS Take 5,000 Units by mouth daily.   Marland Kitchen ibuprofen (ADVIL,MOTRIN) 200 MG tablet Take 400 mg by mouth every 6 (six) hours as needed for mild pain.  Marland Kitchen levothyroxine (SYNTHROID, LEVOTHROID) 88 MCG tablet Take 1 tablet (88 mcg total) by mouth daily.  . Memantine HCl-Donepezil HCl (NAMZARIC) 7 & 14 & 21 &28 -10 MG C4PK Take 1 tablet by mouth daily.  . Multiple Vitamins-Minerals (CENTRUM SILVER PO) Take 1 tablet by mouth daily. Reported on 12/12/2015   No facility-administered encounter medications on file as of 08/03/2019.    Physical Exam: Blood pressure 120/70, pulse 61, temperature 98.3 F (36.8 C), temperature source Oral, height 5' (1.524 m), weight 137 lb  9.6 oz (62.4 kg), SpO2 98 %. Gen:      No acute distress HEENT:  EOMI, sclera anicteric Neck:     No masses; no thyromegaly Lungs:    Clear to auscultation bilaterally; normal respiratory effort CV:         Regular rate and rhythm; no murmurs Abd:      + bowel sounds; soft, non-tender; no palpable masses, no distension Ext:    No edema; adequate peripheral perfusion Skin:      Warm and dry; no rash Neuro: alert and oriented x 3 Psych: normal mood and affect  Data Reviewed: Chest x-ray 03/31/16-no acute cardiopulmonary abnormality. Images reviewed  FENO 05/27/17- 5  Assessment:  Mild persistent asthma I feel she can come off her controller medication as symptoms are minimal Stop Advair P and monitor symptoms Keep albuterol as needed as rescue inhaler.  Return to clinic in 3 months and reassess  Plan/Recommendations: - Stop Arnuity - Albuterol PRN  Marshell Garfinkel MD [facility] Pager [phone] 08/03/2019, 10:11 AM  CC: Sharion Balloon, FNP

## 2019-08-04 ENCOUNTER — Telehealth: Payer: Self-pay | Admitting: Family

## 2019-08-04 MED ORDER — ONDANSETRON HCL 4 MG PO TABS: 4.0000 mg | ORAL_TABLET | Freq: Three times a day (TID) | ORAL | 0 refills | Status: DC | PRN

## 2019-08-04 NOTE — Telephone Encounter (Signed)
Try to continue medication. I have sent in zofran that will help with nausea. Take 30-40 mins before taking Namzaric

## 2019-08-04 NOTE — Telephone Encounter (Signed)
Pt called and detailed VM left with info

## 2019-08-05 LAB — BUN+CREAT
BUN/Creatinine Ratio: 17 (ref 12–28)
BUN: 13 mg/dL (ref 8–27)
Creatinine, Ser: 0.77 mg/dL (ref 0.57–1.00)
GFR calc Af Amer: 86 mL/min/{1.73_m2} (ref >59)
GFR calc non Af Amer: 75 mL/min/{1.73_m2} (ref >59)

## 2019-08-05 LAB — METHYLMALONIC ACID, SERUM: Methylmalonic Acid: 77 nmol/L (ref 0–378)

## 2019-08-05 LAB — B12 AND FOLATE PANEL
Folate: >20 ng/mL (ref >3.0)
Vitamin B-12: 481 pg/mL (ref 232–1245)

## 2019-08-05 LAB — HOMOCYSTEINE: Homocysteine: 9.7 umol/L (ref 0.0–19.2)

## 2019-08-05 LAB — VITAMIN B1: Thiamine: 182.7 nmol/L (ref 66.5–200.0)

## 2019-08-05 LAB — RPR: RPR Ser Ql: NONREACTIVE

## 2019-08-19 ENCOUNTER — Ambulatory Visit
Admission: RE | Admit: 2019-08-19 | Discharge: 2019-08-19 | Disposition: A | Discharge status: Home / Self Care | Payer: Medicare Other | Source: Ambulatory Visit | Attending: Neurology | Admitting: Neurology

## 2019-08-19 ENCOUNTER — Other Ambulatory Visit: Payer: Self-pay

## 2019-08-19 DIAGNOSIS — R413 Other amnesia: Secondary | ICD-10-CM | POA: Diagnosis not present

## 2019-08-19 MED ORDER — GADOBENATE DIMEGLUMINE 529 MG/ML IV SOLN
12.0000 mL | Freq: Once | INTRAVENOUS | Status: AC | PRN
  Administered 2019-08-19: 12 mL via INTRAVENOUS

## 2019-09-06 ENCOUNTER — Ambulatory Visit: Payer: Medicare Other | Admitting: Psychology

## 2019-09-06 ENCOUNTER — Other Ambulatory Visit: Payer: Self-pay

## 2019-09-06 ENCOUNTER — Encounter: Payer: Self-pay | Admitting: Psychology

## 2019-09-06 ENCOUNTER — Ambulatory Visit: Payer: Medicare Other

## 2019-09-06 DIAGNOSIS — G3184 Mild cognitive impairment, so stated: Secondary | ICD-10-CM | POA: Insufficient documentation

## 2019-09-06 DIAGNOSIS — R413 Other amnesia: Secondary | ICD-10-CM

## 2019-09-06 HISTORY — DX: Mild cognitive impairment of uncertain or unknown etiology: G31.84

## 2019-09-06 NOTE — Progress Notes (Signed)
NEUROPSYCHOLOGICAL EVALUATION [name]. [name] Department of Neurology  Reason for Referral:   [name] is a 78 y.o. Caucasian female referred by Gabrielle Dun, FNP, to characterize her current cognitive functioning and assist with diagnostic clarity and treatment planning in the context of subjective cognitive decline and possible psychiatric comorbidities.  Assessment and Plan:   Clinical Impression(s): [name]'s pattern of performance is suggestive of primary areas of impairment surrounding learning and memory, confrontation naming, and aspects of executive functioning (namely cognitive flexibility and working memory). Additional performance variability was noted across processing speed, response inhibition, and verbal fluency (semantic worse than phonemic). Performance was within normal limits across domains of basic attention, problem solving, receptive language, and visuospatial abilities. Overall, given evidence for cognitive dysfunction, coupled with [name]'s report of intact ability to complete activities of daily living (ADLs), she meets criteria for a Mild Neurocognitive Disorder (formerly "mild cognitive impairment") at the present time.  Regarding etiology, an amnestic memory profile across 2/3 administered memory measures, coupled with deficits in confrontation naming and executive functioning is certainly concerning for Alzheimer's disease. Scores across verbal fluency measures (i.e., semantic worse than phonemic) are also consistent with this presentation. Visuospatial functioning was within normal limits, which could suggest that [name] is in the earlier stages of this illness presently. Notably, while reported traits of ADHD, as well as symptoms of anxiety and ongoing bereavement can certainly influence cognitive abilities, her amnestic memory profile is above and beyond what would be typically expected from these conditions alone or in  tandem. Continued medical monitoring will be important moving forward.  Recommendations: A repeat neuropsychological evaluation in 12-18 months (or sooner if functional decline is noted) is recommended to assess the trajectory of future cognitive decline should it occur. This will also aid in future efforts towards improved diagnostic clarity.  [name] is encouraged to follow-up with Dr. Jaynee Eagles, her neurologist, to discuss the results of the current evaluation. There are several medications commonly prescribed to individuals with memory concerns which Dr. Jaynee Eagles may wish to start [name] on.   Should there be a progression of her current deficits over time, [name] is unlikely to regain any independent living skills lost. Therefore, it is recommended that she remain as involved as possible in all aspects of household chores, finances, and medication management, with supervision to ensure adequate performance. She will likely benefit from the establishment and maintenance of a routine in order to maximize her functional abilities over time.  It will be important for [name] to have another person with her when in situations where she may need to process information, weigh the pros and cons of different options, and make decisions, in order to ensure that she fully understands and recalls all information to be considered.  If not already done, [name] and her family may want to discuss her wishes regarding durable power of attorney and medical decision making, so that she can have input into these choices. Additionally, they may wish to discuss future plans for caretaking and seek out community options for in home/residential care should they become necessary.  To address problems with complex attention and executive functioning, she may wish to consider:   -Avoiding external distractions when needing to concentrate   -Writing down complicated information and using checklists   -Attempting  and completing one task at a time (i.e., no multi-tasking)   -Verbalizing aloud each step of a task to maintain focus   -Reducing  the amount of information considered at one time  Review of Records:   [name] was seen by [name] [name], M.D.) on 08/01/2019 for memory problems. Reported difficulties included forgetting details of conversations, for getting appointments, repeating herself often, and misplacing objects around the home (including written notes for things she intends to remember later on). Some of these traits have been longstanding as her son reported that she displays traits of ADHD (never formally diagnosed). However, they have observed a decline in these abilities over the past year. Notably, this decline has also occurred in the context of her husband, who has been ill and in the ICU for an extended period of time; symptoms of anxiety and depression were also said to have increased given ongoing stressors. Family medical history os notable for 2 sisters with memory complaints and possible Alzheimer's disease diagnoses. Performance on a brief cognitive screening instrument (MMSE) was normal (28/30). Ultimately, [name] was referred for a comprehensive neuropsychological evaluation to characterize her cognitive abilities and to assist with diagnostic clarity and treatment planning.  Brain MRI on 08/20/2019 was unremarkable.   Past Medical History:  Diagnosis Date   Allergic rhinitis 01/08/2010   Qualifier: Diagnosis of  By: [name], [name]    Asthma    Gallstone pancreatitis    Hiatal hernia 03/09/2018   HLD (hyperlipidemia) 03/28/2013   Hypothyroidism    Osteoporosis    Postinflammatory pulmonary fibrosis (HCC)    Vertigo    Vitamin D deficiency 11/21/2015    Past Surgical History:  Procedure Laterality Date   CATARACT EXTRACTION W/PHACO  10/01/2011   Procedure: CATARACT EXTRACTION PHACO AND INTRAOCULAR LENS PLACEMENT (IOC);   Surgeon: [name];  Location: [facility];  Service: Ophthalmology;  Laterality: Left;  CDE:13.74   CATARACT EXTRACTION W/PHACO  10/22/2011   Procedure: CATARACT EXTRACTION PHACO AND INTRAOCULAR LENS PLACEMENT (IOC);  Surgeon: [name];  Location: [facility];  Service: Ophthalmology;  Laterality: Right;  CDE:9.98   CHOLECYSTECTOMY  2010   TUBAL LIGATION      Family History  Problem Relation Age of Onset   Heart failure Mother    Emphysema Mother    Other Father        blood clot in brain   Breast cancer Sister    Cancer Sister        breast   Memory loss Sister    Breast cancer Sister    Cancer Sister        breast   Asthma Sister    Cancer Brother        lung   Memory loss Sister    Anesthesia problems Neg Hx    Hypotension Neg Hx    Malignant hyperthermia Neg Hx    Pseudochol deficiency Neg Hx      Current Outpatient Medications:    albuterol (PROAIR HFA) 108 (90 Base) MCG/ACT inhaler, Inhale 2 puffs into the lungs every 6 (six) hours as needed for wheezing or shortness of breath., Disp: 18 g, Rfl: 11   aspirin EC 81 MG tablet, Take 81 mg by mouth daily.  , Disp: , Rfl:    atorvastatin (LIPITOR) 20 MG tablet, Take 1 tablet (20 mg total) by mouth daily., Disp: 90 tablet, Rfl: 3   Biotin 5000 MCG CAPS, Take 5,000 mcg by mouth daily., Disp: , Rfl:    Cholecalciferol (VITAMIN D-3) 1000 UNITS CAPS, Take 5,000 Units by mouth daily. , Disp: , Rfl:    ibuprofen (ADVIL,MOTRIN) 200  MG tablet, Take 400 mg by mouth every 6 (six) hours as needed for mild pain., Disp: , Rfl:    levothyroxine (SYNTHROID, LEVOTHROID) 88 MCG tablet, Take 1 tablet (88 mcg total) by mouth daily., Disp: 90 tablet, Rfl: 2   Memantine HCl-Donepezil HCl (NAMZARIC) 7 & 14 & 21 &28 -10 MG C4PK, Take 1 tablet by mouth daily., Disp: 28 each, Rfl: 0   Multiple Vitamins-Minerals (CENTRUM SILVER PO), Take 1 tablet by mouth daily. Reported on 12/12/2015, Disp: , Rfl:    ondansetron (ZOFRAN) 4 MG  tablet, Take 1 tablet (4 mg total) by mouth every 8 (eight) hours as needed for nausea or vomiting., Disp: 20 tablet, Rfl: 0  Clinical Interview:   Cognitive Symptoms: Decreased short-term memory: Endorsed. Provided examples included forgetting the details of previous conversations, trouble remember the names of familiar individuals, and misplacing objects around the home. These deficits were said to be noticeable for the past year, but worsened during the past few months. The latter was likely influenced by the passing of Ms. Jeppsen's husband 3 weeks prior, as well as his extended illness and hospitalization creating notable distress. Within the past couple of weeks, Ms. Fullard's son noted that these difficulties appear to have "calmed down some."  Decreased long-term memory: Denied. Decreased attention/concentration: Endorsed. Difficulties with maintaining her focus, ease of distractibility, and losing her train of thought were said to be longstanding in nature; albeit exacerbated given recent stressors. Her son reported longstanding traits of ADHD in Ms. Ewing, but never a formal diagnosis. Ms. Camarena noted that, while symptoms had been around for an extended period of time, symptoms during early childhood and adolescence were absent.  Reduced processing speed: Endorsed. Difficulties with executive functions: Endorsed. Difficulties with organization were longstanding. She also acknowledged some trouble with decision making in that she is more likely to seek advice now than in the past. Her son also reported mild impulsivity at times in the form of her speaking her mind more fluidly before thinking about the ramifications.  Difficulties with emotion regulation: Denied. Difficulties with receptive language: Endorsed. It was unclear if these were due to deficits in processing speed and attention/concentration, or represented true comprehension deficits. Difficulties with word finding: Denied. Decreased  visuoperceptual ability: Denied.  Difficulties completing ADLs: Denied.  Additional Medical History: History of traumatic brain injury/concussion: Denied. History of stroke: Denied. History of seizure activity: Denied. History of known exposure to toxins: Denied. Symptoms of chronic pain: Denied. Experience of frequent headaches/migraines: Denied. Frequent instances of dizziness/vertigo: Denied. Rare symptoms of dizziness were reported, generally when Ms. Chavarin stands up too quickly.   Sensory changes: Ms. Doyel wears corrective lenses with positive effect. Other sensory changes/difficulties (e.g., hearing, taste, or smell) were denied.  Balance/coordination difficulties: Denied. Other motor difficulties: Denied.  Sleep History: Estimated hours obtained each night: 7 hours. Difficulties falling asleep: Denied. Difficulties staying asleep: Denied. Feels rested and refreshed upon awakening: Endorsed.  History of snoring: Denied. History of waking up gasping for air: Denied. Witnessed breath cessation while asleep: Denied.  History of vivid dreaming: Denied. Excessive movement while asleep: Denied. Instances of acting out her dreams: Denied.  Psychiatric/Behavioral Health History: Depression: Acutely, Ms. Bath described the previous weeks to months as "rough" give her husband's extended illness and eventual passing (the latter occurred 3 weeks prior to the current evaluation). Outside of these events, she described her mood as positive overall and denied ever being diagnosed with any mental health concerns. Current or remote suicidal ideation, intent,  or plan was also denied. Anxiety: Denied (see above). Mania: Denied. Trauma History: Denied. Visual/auditory hallucinations: Denied. Delusional thoughts: Denied. Mental health treatment: Denied.  Tobacco: Denied. Alcohol: Ms. Varone denied current alcohol consumption, as well as a history of problematic alcohol use, abuse, or  dependence.  Recreational drugs: Denied. Caffeine: 2 cups of coffee in the morning.   Academic/Vocational History: Highest level of educational attainment: 11 years. Ms. Kenealy noted that she did earn her GED at a later date. She described herself as a good (B/C) student in academic settings. Mathematics was described as a relative weakness.  History of developmental delay: Denied. History of grade repetition: Endorsed. She reported needing to repeat the 2nd grade, assumedly due to academic reasons.  Enrollment in special education courses: Denied. History of diagnosed specific learning disability: Denied. History of ADHD: Denied. However, as described above, her and her son expressed theories surrounding her having traits of ADHD, given longstanding difficulties with attention/concentration.   Employment: Retired. She previously worked at a [employer] for over 30 years.   Evaluation Results:   Behavioral Observations: Ms. Hubers was accompanied by her son, arrived to her appointment on time, and was appropriately dressed and groomed. Observed gait and station were within normal limits. Gross motor functioning appeared intact upon informal observation and no abnormal movements (e.g., tremors) were noted. Her affect was generally positive, but did range appropriately given the subject being discussed during the clinical interview or the task at hand during testing procedures. She expressed anxiety surrounding the appointment and testing procedures. Spontaneous speech was fluent and word finding difficulties were not observed during the clinical interview. However, these were apparent during cognitive testing. Sustained attention was appropriate throughout. Thought processes were coherent, organized, and normal in content. However, she did occasionally need to be reminded of task instructions mid-way through certain assessments or questionnaires. Task engagement was adequate and she persisted  when challenged. Overall, Ms. Alkire was cooperative with the clinical interview and subsequent testing procedures.   Adequacy of Effort: The validity of neuropsychological testing is limited by the extent to which the individual being tested may be assumed to have exerted adequate effort during testing. Ms. Mehl expressed her intention to perform to the best of her abilities and exhibited adequate task engagement and persistence. Scores across stand-alone and embedded performance validity measures were within expectation. As such, the results of the current evaluation are believed to be a valid representation of Ms. Atkin's current cognitive functioning.  Test Results: Ms. Brocksmith was largely oriented at the time of the current evaluation. Points were lost for her being unaware of the current time (0/2).  Intellectual abilities based upon educational and vocational attainment were estimated to be in the average range. Premorbid abilities were estimated to be within the average range based upon a single-word reading test.   Processing speed was variable, ranging from the exceptionally low to above average normative ranges. Basic attention was average. More complex attention (e.g., working memory) was exceptionally low. Executive functioning was variable, but generally impaired. Cognitive flexibility represented a notable weakness, hypothesis testing/poblem solving was largely within normal limits, and response inhibition was variable.  Assessed receptive language abilities were average. Likewise, Ms. Shaft did not exhibit any difficulties comprehending task instructions (she did however appear to forget these instructions across some tasks) and answered all questions asked of her appropriately. Assessed expressive language (e.g., verbal fluency and confrontation naming) was variable. Phonemic fluency was average, semantic fluency was below average, and confrontation naming was  exceptionally low.     Assessed visuospatial/visuoconstructional abilities were within normal limits.    Learning (i.e., encoding) of novel verbal and visual information was variable, but generally impaired. A relative strength was exhibited across a story learning test. Spontaneous delayed recall (i.e., retrieval) of previously learned information was exceptionally low and Ms. Nakanishi was essentially amnestic across 2/3 memory tasks administered. Performance across recognition tasks was generally poor, suggesting limited evidence for information consolidation.   Results of emotional screening instruments suggested that recent symptoms of generalized anxiety were in the minimal range, while symptoms of depression were within normal limits. A screening instrument assessing recent sleep quality suggested the presence of minimal sleep dysfunction.  Tables of Scores:   Note: This summary of test scores accompanies the interpretive report and should not be considered in isolation without reference to the appropriate sections in the text. Descriptors are based on appropriate normative data and may be adjusted based on clinical judgment. The terms impaired and within normal limits (WNL) are used when a more specific level of functioning cannot be determined.       Effort Testing:   DESCRIPTOR       Dot Counting Test: --- --- Within Expectation  CVLT-III Forced Choice Recognition: --- --- Within Expectation  BVMT-R Retention Percentage: --- --- Within Expectation       Orientation:      Raw Score Percentile   NAB Orientation, Form 1 27/29 --- ---       Intellectual Functioning:           Standard Score Percentile   Test of Premorbid Functioning: 90 25 Average       Memory:          Wechsler Memory Scale (WMS-IV):                       Raw Score (Scaled Score) Percentile     Logical Memory I 22/53 (7) 16 Below Average    Logical Memory II 3/39 (4) 2 Well Below Average    Logical Memory Recognition 13/23 3-9 Well  Below Average       [place] Verbal Learning Test (CVLT-III) Brief Form: Raw Score (Scaled/Standard Score) Percentile     Total Trials 1-4 16/36 (63) 1 Exceptionally Low    Short-Delay Free Recall 1/9 (1) <1 Exceptionally Low    Long-Delay Free Recall 0/9 (1) <1 Exceptionally Low    Long-Delay Cued Recall 1/9 (1) <1 Exceptionally Low      Recognition Hits 8/9 (10) 50 Average      False Positive Errors 12 (1) <1 Exceptionally Low       Brief Visuospatial Memory Test (BVMT-R), Form 1: Raw Score (T Score) Percentile     Total Trials 1-3 4/36 (22) <1 Exceptionally Low    Delayed Recall 2/12 (27) 1 Exceptionally Low    Recognition Discrimination Index 3 3-5 Well Below Average      Recognition Hits 6/6 >16 Within Normal Limits      False Positive Errors 3 <1 Exceptionally Low        Attention/Executive Function:          Trail Making Test (TMT): Raw Score (T Score) Percentile     Part A 92 secs.,  1 error (24) <1 Exceptionally Low    Part B DC'D @ 300 secs., 6 errors --- Impaired        Scaled Score Percentile   WAIS-IV Coding: 8 25 Average  NAB Attention Module, Form 1: T Score Percentile     Digits Forward 53 62 Average    Digits Backwards 30 2 Well Below Average       D-KEFS Color-Word Interference Test: Raw Score (Scaled Score) Percentile     Color Naming 29 secs. (12) 75 Above Average    Word Reading 22 secs. (12) 75 Above Average    Inhibition 82 secs. (9) 37 Average      Total Errors 3 errors (10) 50 Average    Inhibition/Switching 149 secs. (1) <1 Exceptionally Low      Total Errors 15 errors (1) <1 Exceptionally Low       D-KEFS 20 Questions Test: Scaled Score Percentile     Total Weighted Achievement Score 9 37 Average    Initial Abstraction Score 6 9 Below Average       Language:          Verbal Fluency Test: Raw Score (Z-Score) Percentile     Phonemic Fluency (FAS) 27 (-0.69) 25 Average    Animal Fluency 11 (-1.26) 11 Below Average  *Based on [employer]  Older Normative Studies (MOANS)          NAB Language Module, Form 2: T Score Percentile     Auditory Comprehension 47 38 Average    Naming 19/31 (22) <1 Exceptionally Low       Visuospatial/Visuoconstruction:      Raw Score Percentile   Clock Drawing: 9/10 --- Within Normal Limits       NAB Spatial Module, Form 2: T Score Percentile     Figure Drawing Copy 53 62 Average        Scaled Score Percentile   WAIS-IV Matrix Reasoning: 5 5 Well Below Average       Mood and Personality:      Raw Score Percentile   Geriatric Depression Scale: 6 --- Within Normal Limits  Geriatric Anxiety Scale: 6 --- Minimal    Somatic 1 --- Minimal    Cognitive 2 --- Minimal    Affective 3 --- Minimal       Additional Questionnaires:      Raw Score Percentile   PROMIS Sleep Disturbance Questionnaire: 8 --- None to Slight   Informed Consent and Coding/Compliance:   Ms. Stevan BornHylton was provided with a verbal description of the nature and purpose of the present neuropsychological evaluation. Also reviewed were the foreseeable risks and/or discomforts and benefits of the procedure, limits of confidentiality, and mandatory reporting requirements of this provider. The patient was given the opportunity to ask questions and receive answers about the evaluation. Oral consent to participate was provided by the patient.   This evaluation was conducted by Newman NickelsZachary C. Deakin Lacek, Ph.D., licensed clinical neuropsychologist. Ms. Stevan BornHylton completed a 30-minute clinical interview, billed as one unit [phone], and 125 minutes of cognitive testing, billed as one unit [phone] and three additional units [phone]. Psychometrist Shan LevansKim Shuttleworth, B.S., assisted Dr. Milbert CoulterMerz with test administration and scoring procedures. As a separate and discrete service, Dr. Milbert CoulterMerz spent a total of 180 minutes in interpretation and report writing, billed as one unit 96132 and two units 96133.

## 2019-09-06 NOTE — Progress Notes (Signed)
   Neuropsychology Note   [name] completed 110 minutes of neuropsychological testing with technician, Cruzita Lederer, B.S., under the supervision of Dr. Christia Reading, Ph.D., licensed neuropsychologist. The patient did not appear overtly distressed by the testing session, per behavioral observation or via self-report to the technician. Rest breaks were offered.    In considering the patient's current level of functioning, level of presumed impairment, nature of symptoms, emotional and behavioral responses during the interview, level of literacy, and observed level of motivation/effort, a battery of tests was selected and communicated to the psychometrician.   Communication between the psychologist and technician was ongoing throughout the testing session and changes were made as deemed necessary based on patient performance on testing, technician observations and additional pertinent factors such as those listed above.   Armentha P Sevey will return within approximately two weeks for an interactive feedback session with Dr. Melvyn Novas at which time his test performances, clinical impressions, and treatment recommendations will be reviewed in detail. The patient understands she can contact our office should she require our assistance before this time.   Full report to follow.  110 minutes were spent face-to-face with patient administering standardized tests and 15 minutes were spent scoring (technician). [CPT [id number], [id number]]

## 2019-09-14 ENCOUNTER — Ambulatory Visit: Payer: Medicare Other | Admitting: Psychology

## 2019-09-14 ENCOUNTER — Other Ambulatory Visit: Payer: Self-pay

## 2019-09-14 ENCOUNTER — Encounter: Payer: Self-pay | Admitting: Psychology

## 2019-09-14 DIAGNOSIS — G3184 Mild cognitive impairment, so stated: Secondary | ICD-10-CM

## 2019-09-14 NOTE — Progress Notes (Signed)
   Neuropsychology Feedback Session [name]. [place] Department of Neurology  Reason for Referral:   Gabrielle Yanes Hyltonis a 78 y.o. Caucasian female referred by Evelina Dun, FNP,to characterize hercurrent cognitive functioning and assist with diagnostic clarity and treatment planning in the context of subjective cognitive decline and possible psychiatric comorbidities.  Feedback:   [name] completed a comprehensive neuropsychological evaluation on 09/06/2019. Please refer to that encounter for the full report. Briefly, results suggested primary areas of impairment surrounding learning and memory, confrontation naming, and aspects of executive functioning (namely cognitive flexibility and working memory). Additional performance variability was noted across processing speed, response inhibition, and verbal fluency (semantic worse than phonemic). Given [name] and her son's report of intact activities of daily living (ADLs), she meets criteria for a mild neurocognitive disorder (formerly "mild cognitive impairment") at the present time. Regarding etiology, an amnestic memory profile across 2/3 administered memory measures, coupled with deficits in confrontation naming and executive functioning is certainly concerning for Alzheimer's disease. Scores across verbal fluency measures (i.e., semantic worse than phonemic) are also consistent with this presentation. Visuospatial functioning was within normal limits, which could suggest that [name] is in the earlier stages of this illness presently.  [name] was accompanied by her son. Content of the current session focused on the results of the current evaluation. [name] and her son were given the opportunity to ask questions and their questions were answered. They were also encouraged to reach out should additional questions arise. A copy of her report was provided at the conclusion of the visit.      A total of 20  minutes were spent with [name] and her son during the current feedback session.

## 2019-09-18 ENCOUNTER — Other Ambulatory Visit: Payer: Self-pay | Admitting: Family

## 2019-09-18 DIAGNOSIS — E039 Hypothyroidism, unspecified: Secondary | ICD-10-CM

## 2019-11-27 ENCOUNTER — Telehealth: Payer: Self-pay | Admitting: Neurology

## 2019-11-27 NOTE — Telephone Encounter (Signed)
[name], would you call and see if son and patient would like to do their Wednesday appointment over video or push it back a few months due to the Covid increase? If they want to come to the office this week I am more than happy to meet with them here but if they have a computer we could do it via mychart a little safer. thanks

## 2019-11-28 NOTE — Telephone Encounter (Signed)
FYI I called and spoke with [name]'s son and advised that due to COVID Dr. Lucia Gaskins suggests a MyChart visit for [name] to have her best interests in mind and to limit possible exposure. [name]'s son agreed to this and is aware to help [name] with MyChart tomorrow for 9:30 appt.

## 2019-11-28 NOTE — Progress Notes (Signed)
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  Location: Patient: home Provider: office   I discussed the limitations of evaluation and management by telemedicine and the availability of in person appointments. The patient expressed understanding and agreed to proceed.  Follow Up Instructions:    I discussed the assessment and treatment plan with the patient. The patient was provided an opportunity to ask questions and all were answered. The patient agreed with the plan and demonstrated an understanding of the instructions.   The patient was advised to call back or seek an in-person evaluation if the symptoms worsen or if the condition fails to improve as anticipated.  I provided 25 minutes of non-face-to-face time(we were on video speaking, face to face vitually with patient and son) during this encounter.   Anson Fret, MD    Interval history November 29, 2019: I saw patient for follow-up with her son, both very lovely, MRI of the brain was normal for age, formal memory testing was consisted with mild neurocognitive disorder not dementia, however cannot rule out a prodromal Alzheimer's and we will have to really watch her for progression.  She had vomiting with the Namzaric which is a combination of donepezil and memantine, at this time we will just start her on donepezil very low-dose 5 mg they can even start with half a tab just to make sure she does not have any side effects.  Then we can increase it when we see her in 3 to 4 months in the office or via video.  They can always reach me by email or  phone calls if anything happens.  HPI:  [name] is a 79 y.o. female here as requested by Gabrielle Spencer, FNP for memory problems.  Past medical history asthma, hyperlipidemia, overweight, hypothyroidism, vitamin D deficiency, memory problem. Here with her son who also provides information. She says she forgets things, she tries to write things done and she loses the paper and finds whatever she is looking for, started at least a year ago per son. She has a form of ADD and son thought she was forgetful due to this but noticed it more a year ago. Son says they spent the day together and they went to [employer], she paid him for groceries and then forgot and says again she needed to pay him in the same day. She forgets appointments, has to tell patient multiple times in the same day about things, more short-term memory, she lives with her husband and he is in ICU and the last 2 weeks it has been worse since she is going to the hospital and there is stress. Short-term memory progressive. Her energy is poor. Anxiety is worsening but memory changes started prior to this increased anxiety. She is paying all the bills now, husband helps her and she writes it down, not missing any bills and doing well, son says she has double paid even up to 2 years ago bc she forget she already paid.She is able to take medications, not missing any medications, no difficulties driving, she says she was lost once. But she  has ADD and her brain "wanders off". No other focal neurologic deficits, associated symptoms, inciting events or modifiable factors. Sister had Alzheimer's, died at 62 and had it 15 years.   Reviewed notes, labs and imaging from outside physicians, which showed:   I reviewed Marin Shutter' notes.  Patient very anxious about her memory.  Patient requested a neurology referral.  She was encouraged to write notes.  She was started on memantine and donepezil (Namzaric) titration up to 28 mg memantine with 10 mg  donepezil.  Mini-Mental status exam was 28 out of 30.  This was July 27, 2019.  Labs taken the same day include normal TSH, normal CBC, normal CMP, excellent vitamin D levels.  Review of Systems: Patient complains of symptoms per HPI as well as the following symptoms: memory loss. Pertinent negatives and positives per HPI. All others negative.   Social History   Socioeconomic History  . Marital status: Married    Spouse name: Not on file  . Number of children: 3  . Years of education: 6  . Highest education level: Not on file  Occupational History  . Occupation: retired Scientist, product/process development  Tobacco Use  . Smoking status: Never Smoker  . Smokeless tobacco: Never Used  Substance and Sexual Activity  . Alcohol use: No  . Drug use: No  . Sexual activity: Not on file  Other Topics Concern  . Not on file  Social History Narrative   Lives at home alone    Right handed   Social Determinants of Health   Financial Resource Strain:   . Difficulty of Paying Living Expenses: Not on file  Food Insecurity:   . Worried About Programme researcher, broadcasting/film/video in the Last Year: Not on file  . Ran Out of Food in the Last Year: Not on file  Transportation Needs:   . Lack of Transportation (Medical): Not on file  . Lack of Transportation (Non-Medical): Not on file  Physical Activity:   . Days of Exercise per Week: Not on file  . Minutes of Exercise per Session: Not on file  Stress:   . Feeling of Stress : Not on file  Social Connections:   . Frequency of Communication with Friends and Family: Not on file  . Frequency of Social Gatherings with Friends and Family: Not on file  . Attends Religious Services: Not on file  . Active Member of Clubs or Organizations: Not on file  . Attends Banker Meetings: Not on file  . Marital Status: Not on file  Intimate Partner Violence:   . Fear of Current or Ex-Partner: Not on file  . Emotionally Abused: Not on file  . Physically Abused: Not on file  .  Sexually Abused: Not on file    Family History  Problem Relation Age of Onset  . Heart failure Mother   . Emphysema Mother   . Other Father        blood clot in brain  . Breast cancer Sister   . Cancer Sister        breast  . Memory loss Sister   . Breast cancer Sister   . Cancer Sister        breast  . Asthma Sister   . Cancer Brother        lung  . Memory loss Sister   . Anesthesia problems Neg Hx   . Hypotension Neg Hx   . Malignant hyperthermia Neg Hx   . Pseudochol deficiency Neg  Hx     Past Medical History:  Diagnosis Date  . Allergic rhinitis 01/08/2010   Qualifier: Diagnosis of  By: Joya Gaskins MD, Burnett Harry   . Asthma   . Gallstone pancreatitis   . Hiatal hernia 03/09/2018  . HLD (hyperlipidemia) 03/28/2013  . Hypothyroidism   . Mild neurocognitive disorder 09/06/2019  . Osteoporosis   . Postinflammatory pulmonary fibrosis ([place])   . Vertigo   . Vitamin D deficiency 11/21/2015    Patient Active Problem List   Diagnosis Date Noted  . Mild neurocognitive disorder 09/06/2019  . Hiatal hernia 03/09/2018  . Overweight (BMI 25.0-29.9) 08/14/2016  . Osteoporosis 12/02/2015  . Vitamin D deficiency 11/21/2015  . HLD (hyperlipidemia) 03/28/2013  . Allergic rhinitis 01/08/2010  . Hypothyroidism 07/03/2007  . Moderate persistent asthma in adult without complication 16/07/9603  . 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 CATARACT EXTRACTION W/PHACO  10/01/2011   Procedure: CATARACT EXTRACTION PHACO AND INTRAOCULAR LENS PLACEMENT (IOC);  Surgeon: Tonny Branch;  Location: [facility];  Service: Ophthalmology;  Laterality: Left;  CDE:13.74  . CATARACT EXTRACTION W/PHACO  10/22/2011   Procedure: CATARACT EXTRACTION PHACO AND INTRAOCULAR LENS PLACEMENT (IOC);  Surgeon: Tonny Branch;  Location: [facility];  Service: Ophthalmology;  Laterality: Right;  CDE:9.98  . CHOLECYSTECTOMY  2010  . TUBAL LIGATION      Current  Outpatient Medications  Medication Sig Dispense Refill  . albuterol (PROAIR HFA) 108 (90 Base) MCG/ACT inhaler Inhale 2 puffs into the lungs every 6 (six) hours as needed for wheezing or shortness of breath. 18 g 11  . aspirin EC 81 MG tablet Take 81 mg by mouth daily.      Marland Kitchen atorvastatin (LIPITOR) 20 MG tablet Take 1 tablet (20 mg total) by mouth daily. (Patient not taking: Reported on 11/29/2019) 90 tablet 3  . Biotin 5000 MCG CAPS Take 5,000 mcg by mouth daily.    . Cholecalciferol (VITAMIN D-3) 1000 UNITS CAPS Take 5,000 Units by mouth daily.     Marland Kitchen donepezil (ARICEPT) 5 MG tablet Take 1 tablet (5 mg total) by mouth at bedtime. 90 tablet 2  . ibuprofen (ADVIL,MOTRIN) 200 MG tablet Take 400 mg by mouth every 6 (six) hours as needed for mild pain.    Marland Kitchen levothyroxine (SYNTHROID) 88 MCG tablet Take 1 tablet by mouth once daily 90 tablet 2  . Memantine HCl-Donepezil HCl (NAMZARIC) 7 & 14 & 21 &28 -10 MG C4PK Take 1 tablet by mouth daily. (Patient not taking: Reported on 11/29/2019) 28 each 0  . Multiple Vitamins-Minerals (CENTRUM SILVER PO) Take 1 tablet by mouth daily. Reported on 12/12/2015    . ondansetron (ZOFRAN) 4 MG tablet Take 1 tablet (4 mg total) by mouth every 8 (eight) hours as needed for nausea or vomiting. (Patient not taking: Reported on 11/29/2019) 20 tablet 0   No current facility-administered medications for this visit.    Allergies as of 11/29/2019 - Review Complete 11/29/2019  Allergen Reaction Noted  . Sulfonamide derivatives  12/15/2007    Vitals: There were no vitals taken for this visit. Last Weight:  Wt Readings from Last 1 Encounters:  08/03/19 134 lb 12.8 oz (61.1 kg)   Last Height:   Ht Readings from Last 1 Encounters:  08/03/19 5' (1.524 m)     Physical exam: Exam: Gen: NAD, conversant, well nourised, obese, well groomed  CV: RRR, no MRG. No Carotid Bruits. No peripheral edema, warm, nontender Eyes: Conjunctivae clear without exudates or  hemorrhage  Neuro: Detailed Neurologic Exam  Speech:    Speech is normal; fluent and spontaneous with normal comprehension.  Cognition:  MMSE - Mini Mental State Exam 08/01/2019 07/27/2019  Orientation to time 3 5  Orientation to Place 3 5  Registration 3 3  Attention/ Calculation 1 3  Recall 1 3  Language- name 2 objects 1 2  Language- repeat 0 1  Language- follow 3 step command 3 3  Language- read & follow direction 1 1  Write a sentence 1 1  Copy design 0 1  Total score 17 28       The patient is oriented to person, place, and time;     recent and remote memory intact;     language fluent;     normal attention, concentration,     fund of knowledge Cranial Nerves:    The pupils are equal, round, and reactive to light. . Visual fields are full to finger confrontation. Extraocular movements are intact. Trigeminal sensation is intact and the muscles of mastication are normal. The face is symmetric. The palate elevates in the midline. Hearing intact. Voice is normal. Shoulder shrug is normal. The tongue has normal motion without fasciculations.   Coordination:    Normal finger to nose  Gait:    Good stride, not shuffling  Motor Observation:    No asymmetry, no atrophy, and no involuntary movements noted. Tone:    Normal muscle tone.    Posture:    Posture is normal.     Strength:    Strength is V/V in the upper and lower limbs.      Sensation: intact to LT     Reflex Exam:  DTR's:    Absent AJs otherwise deep tendon reflexes in the upper and lower extremities are brisk bilaterally.   Toes:    The toes are withdrawal bilaterally.   Clonus:    Clonus is absent.    Assessment/Plan:   79 y.o. female here as requested by Gabrielle Spencer, FNP for memory problems.  Past medical history asthma, hyperlipidemia, overweight, hypothyroidism, vitamin D deficiency, memory problem.  Diagnosed with mild neurocognitive disorder possibly prodromal Alzheimer's but we have to  watch her for progression, consider repeating testing in 12-18 months.   - Christy Hawks started [employer], she had reaction, we stopped it and will try very low dose Aricept 5mg  can even start with 1/2 a tab - MRI brain and lab work for reversible causes of dementia was essentially normal for age - Formal neurocognitive testing c/w mild neurocognitive disorder.  We discussed that she was not diagnosed with dementia, mild neurocognitive disorder, third of people may progress to dementia, third may get better, third may stay the same, but given her deficits this may also be a prodromal Alzheimer's. -We will see them back in 4 to 6 months, they can come into the office at that time if safe or change to video.  Meds ordered this encounter  Medications  . donepezil (ARICEPT) 5 MG tablet    Sig: Take 1 tablet (5 mg total) by mouth at bedtime.    Dispense:  90 tablet    Refill:  2     Cc: , FNP,  Gabrielle Spencer, MD  [facility] [address] [place], [place]  Phone [phone] Fax [phone]

## 2019-11-29 ENCOUNTER — Encounter: Payer: Self-pay | Admitting: Neurology

## 2019-11-29 ENCOUNTER — Telehealth: Payer: Self-pay | Admitting: Neurology

## 2019-11-29 ENCOUNTER — Telehealth (INDEPENDENT_AMBULATORY_CARE_PROVIDER_SITE_OTHER): Payer: Medicare Other | Admitting: Neurology

## 2019-11-29 DIAGNOSIS — G3184 Mild cognitive impairment, so stated: Secondary | ICD-10-CM | POA: Diagnosis not present

## 2019-11-29 MED ORDER — DONEPEZIL HCL 5 MG PO TABS: 5.0000 mg | ORAL_TABLET | Freq: Every day | ORAL | 2 refills | Status: DC

## 2019-11-29 NOTE — Telephone Encounter (Signed)
Spoke w/ both pt & son Earl Lites on [place]) and updated pt's chart for the VV today. History unchanged. Meds updated.

## 2019-11-29 NOTE — Telephone Encounter (Signed)
[name] which you mind calling this patient and her son and scheduling a follow-up with me in 4 months please, they can elect to come in the office or make it video either is fine with me.

## 2019-11-30 NOTE — Telephone Encounter (Signed)
I called and spoke with patient's son, Tammy Sours. He states he will call back to schedule 4 month follow-up because he is not currently at his calendar.

## 2019-12-11 ENCOUNTER — Other Ambulatory Visit: Payer: Self-pay | Admitting: Neurology

## 2019-12-11 MED ORDER — SERTRALINE HCL 50 MG PO TABS: 50.0000 mg | ORAL_TABLET | Freq: Every day | ORAL | 6 refills | Status: DC

## 2020-01-20 IMAGING — CT CT RENAL STONE PROTOCOL
2 of 4 series · 16 of 46 positions shown, 18 images · non-contrast
Comparison: Ultrasound abdomen 03/29/2009

ADDENDUM:
Upon re-review of the images, appendix is identified and is
borderline enlarged measuring up to 8-9 mm in diameter with normal
size of the tip. No stones. No definitive surrounding inflammation.
Repeat CT with oral and intravenous contrast could prove helpful if
clinical picture suggests acute appendicitis. Case was discussed
with Dr. Aashiq.
CLINICAL DATA: Flank pain

EXAM:
CT ABDOMEN AND PELVIS WITHOUT CONTRAST
TECHNIQUE: Multidetector CT imaging of the abdomen and pelvis was performed
following the standard protocol without IV contrast.

[Series 2: axial st · axial · 0.70mm/px · z∈[-42,+338]mm · 13 of 84 slices shown, 15 images]
[im 4/84  soft-tissue]
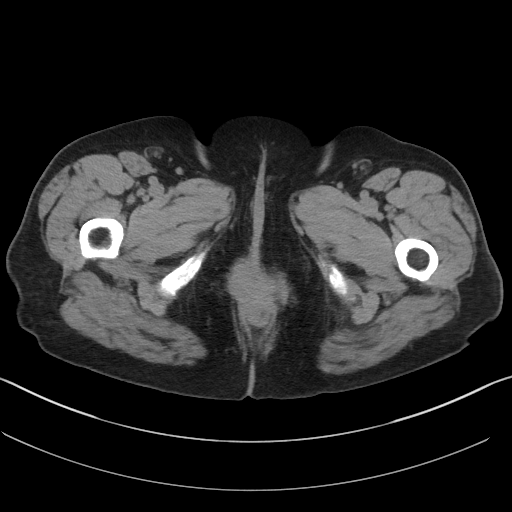
[im 4/84  bone]
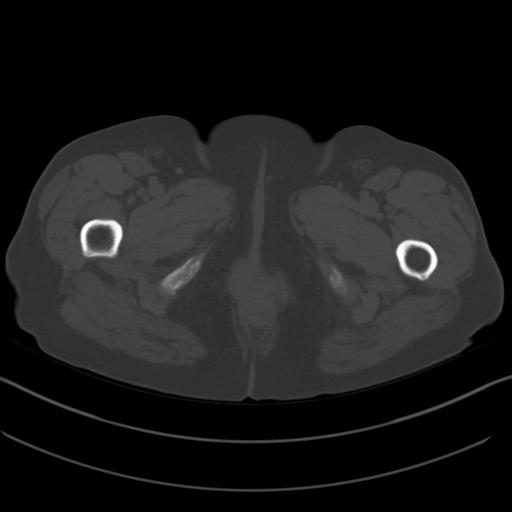
[im 11/84  soft-tissue]
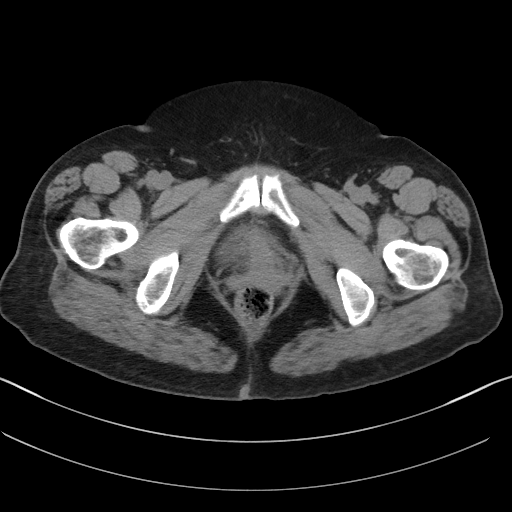
[im 19/84  soft-tissue]
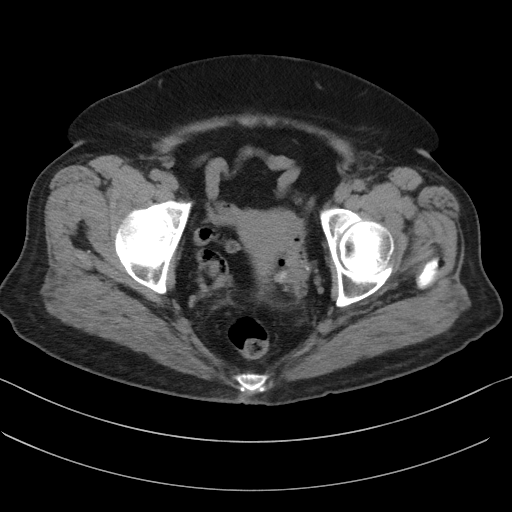
[im 22/84  soft-tissue]
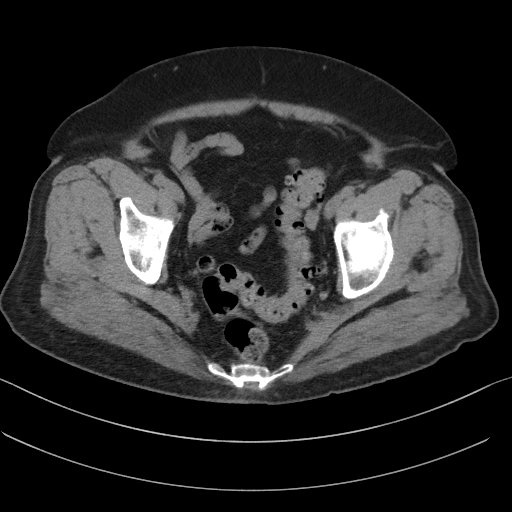
[im 29/84  soft-tissue]
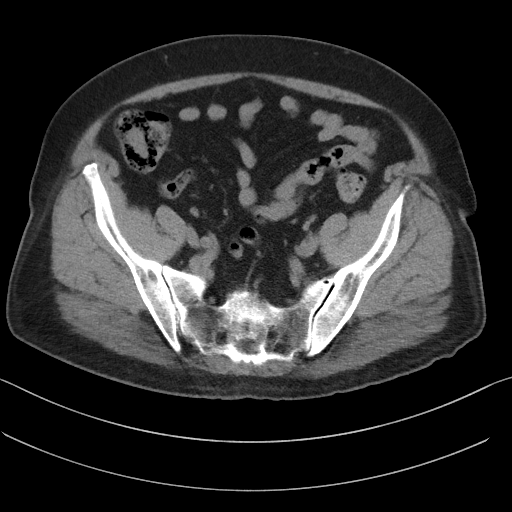
[im 37/84  soft-tissue]
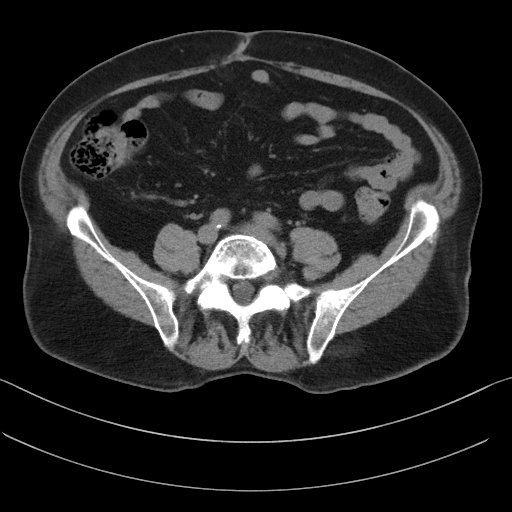
[im 44/84  soft-tissue]
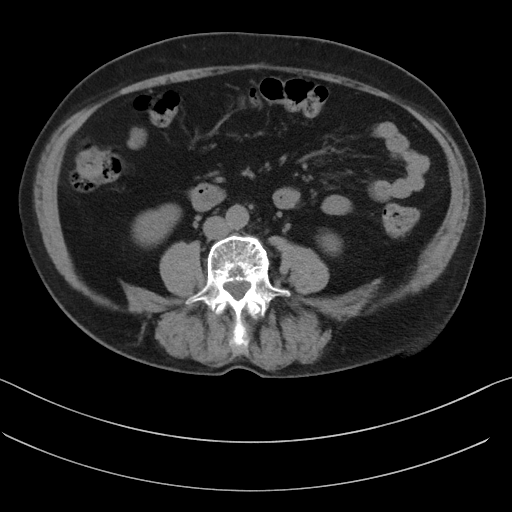
[im 47/84  soft-tissue]
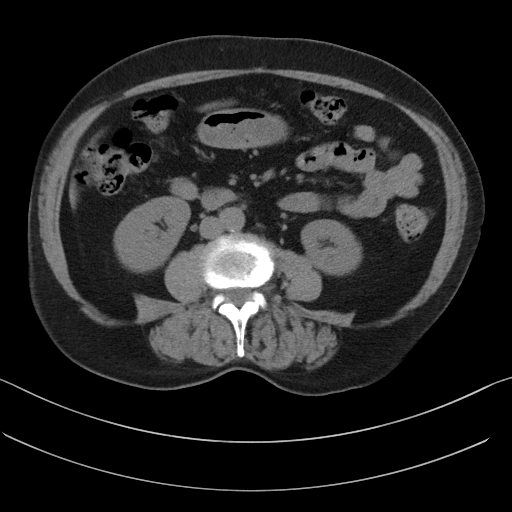
[im 55/84  soft-tissue]
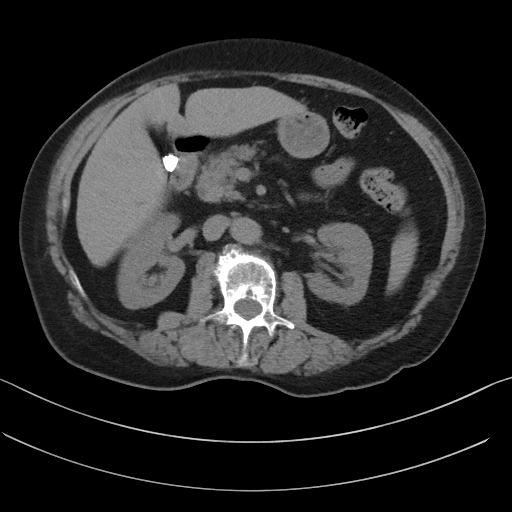
[im 55/84  bone]
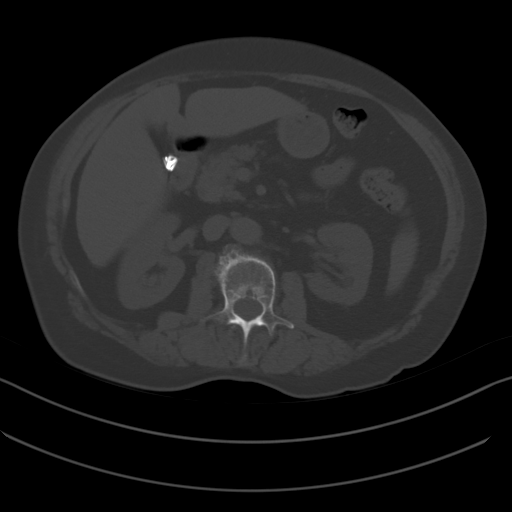
[im 62/84  soft-tissue]
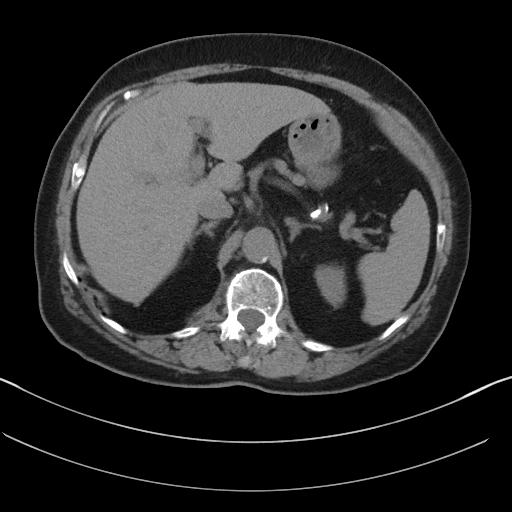
[im 65/84  soft-tissue]
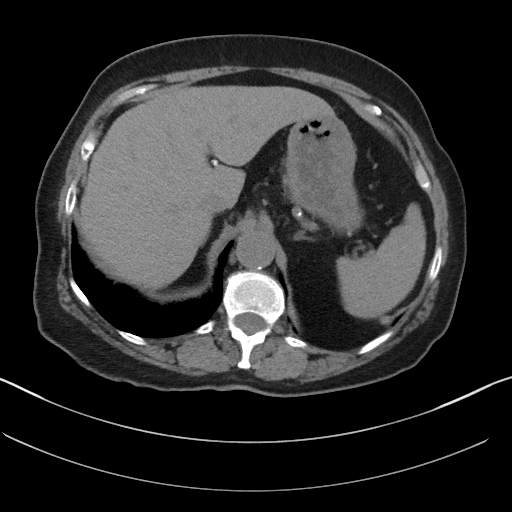
[im 73/84  soft-tissue]
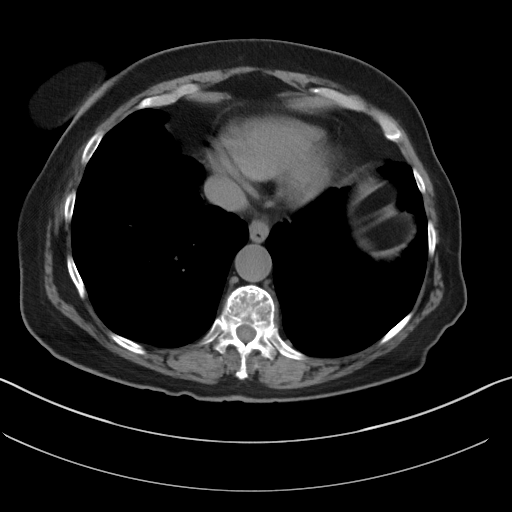
[im 80/84  soft-tissue]
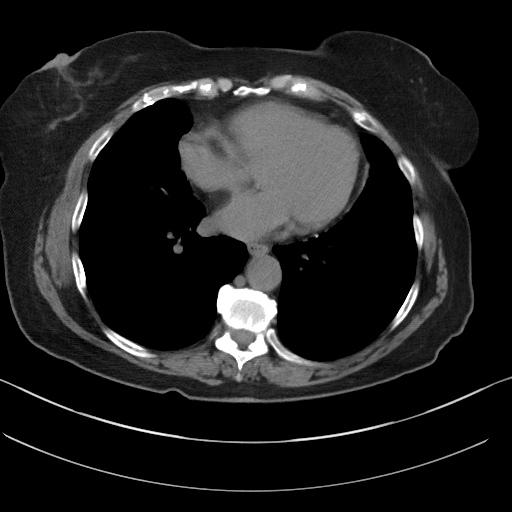

[Series 5: coronal st · coronal · 0.70mm/px · 3 of 94 slices shown]
[im 32/94  soft-tissue]
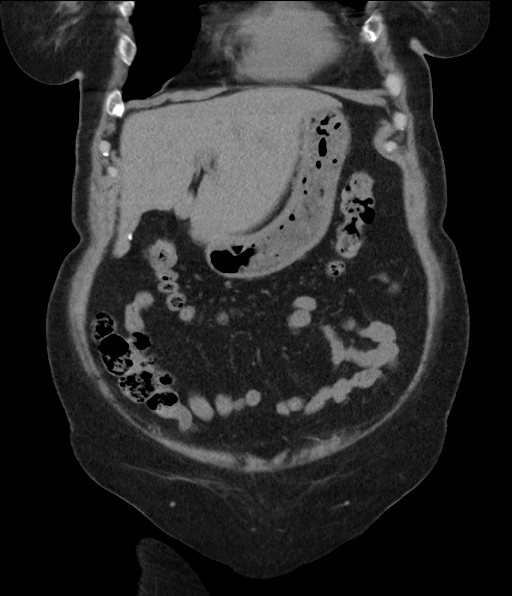
[im 42/94  soft-tissue]
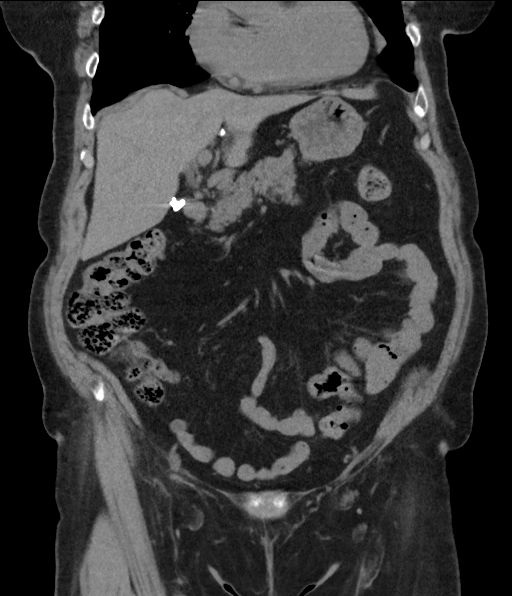
[im 52/94  soft-tissue]
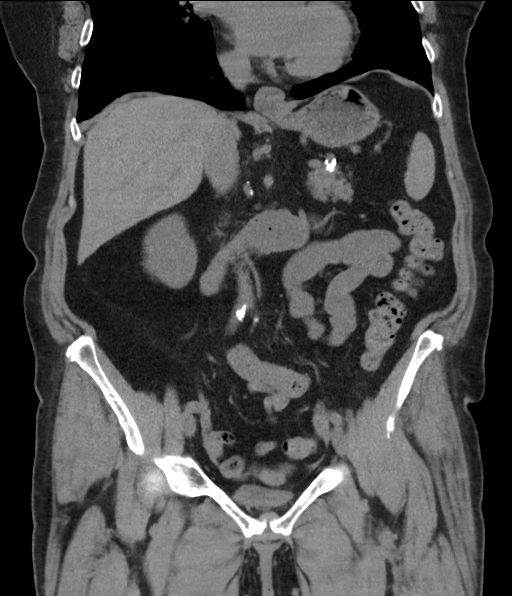

[16 of 46 positions shown; findings below may reference images not displayed]

FINDINGS: Lower chest: Lung bases demonstrate no acute consolidation or
effusion. Heart size upper normal. Small hiatal hernia.

Hepatobiliary: Calcified liver granuloma. Surgical absence of the
gallbladder. No biliary dilatation.

Pancreas: Unremarkable. No pancreatic ductal dilatation or
surrounding inflammatory changes.

Spleen: Normal in size without focal abnormality.

Adrenals/Urinary Tract: Adrenal glands are within normal limits. No
hydronephrosis. Probable cyst midpole left kidney. Bladder
unremarkable

Stomach/Bowel: Stomach is within normal limits. Appendix not well
seen but no right lower quadrant inflammation. Sigmoid colon
diverticula without acute inflammation. No evidence of bowel wall
thickening, distention, or inflammatory changes.

Vascular/Lymphatic: Mild to moderate aortic atherosclerosis. No
aneurysmal dilatation. No significantly enlarged lymph nodes.

Reproductive: Uterus and bilateral adnexa are unremarkable.

Other: Negative for free air or free fluid.

Musculoskeletal: Degenerative changes of the spine. No acute or
suspicious abnormality
IMPRESSION: 1. Negative for nephrolithiasis, hydronephrosis or ureteral stone
2. Sigmoid colon diverticular disease without acute inflammation.

## 2020-02-08 ENCOUNTER — Telehealth: Payer: Self-pay | Admitting: Family

## 2020-02-08 NOTE — Chronic Care Management (AMB) (Signed)
  Chronic Care Management   Note  02/08/2020 Name: [name] MRN: [id number] DOB: 1941-08-01  [name] is a 79 y.o. year old female who is a primary care patient of Sharion Balloon, FNP. I reached out to [facility] by phone today in response to a referral sent by [name]'s health plan.     [name] was given information about Chronic Care Management services today including:  1. CCM service includes personalized support from designated clinical staff supervised by her physician, including individualized plan of care and coordination with other care providers 2. 24/7 contact phone numbers for assistance for urgent and routine care needs. 3. Service will only be billed when office clinical staff spend 20 minutes or more in a month to coordinate care. 4. Only one practitioner may furnish and bill the service in a calendar month. 5. The patient may stop CCM services at any time (effective at the end of the month) by phone call to the office staff. 6. The patient will be responsible for cost sharing (co-pay) of up to 20% of the service fee (after annual deductible is met).  Patient did not agree to enrollment in care management services and does not wish to consider at this time.  Follow up plan: The patient has been provided with contact information for the care management team and has been advised to call with any health related questions or concerns.   [place], [place] [address] Direct Dial: [phone] [email or web address] Website: [email or web address]

## 2020-02-13 DIAGNOSIS — Z1231 Encounter for screening mammogram for malignant neoplasm of breast: Secondary | ICD-10-CM | POA: Diagnosis not present

## 2020-03-11 ENCOUNTER — Telehealth: Payer: Self-pay

## 2020-03-11 NOTE — Telephone Encounter (Signed)
Pt's son, Milderd Meager, (per [facility]) called office and left a VM asking for a call to r/s pt's appt next month. Please call [phone].

## 2020-03-13 NOTE — Telephone Encounter (Signed)
Spoke with patient's son Tammy Sours. He states that they would like to leave appointment date for 6/16.

## 2020-03-18 ENCOUNTER — Telehealth: Payer: Self-pay | Admitting: Family

## 2020-03-18 NOTE — Telephone Encounter (Signed)
Patient thought they switched her thyroid pill. I had patient look at the pills and they are the exact same. Patient advised that it is ok to take.

## 2020-03-27 DIAGNOSIS — L989 Disorder of the skin and subcutaneous tissue, unspecified: Secondary | ICD-10-CM | POA: Diagnosis not present

## 2020-03-27 DIAGNOSIS — Z124 Encounter for screening for malignant neoplasm of cervix: Secondary | ICD-10-CM | POA: Diagnosis not present

## 2020-03-28 ENCOUNTER — Ambulatory Visit: Payer: Medicare Other | Admitting: Neurology

## 2020-03-28 ENCOUNTER — Ambulatory Visit: Payer: Medicare Other | Admitting: Family

## 2020-04-03 DIAGNOSIS — L989 Disorder of the skin and subcutaneous tissue, unspecified: Secondary | ICD-10-CM | POA: Diagnosis not present

## 2020-04-05 ENCOUNTER — Ambulatory Visit (INDEPENDENT_AMBULATORY_CARE_PROVIDER_SITE_OTHER): Payer: Medicare Other | Admitting: Family

## 2020-04-05 ENCOUNTER — Other Ambulatory Visit: Payer: Self-pay

## 2020-04-05 ENCOUNTER — Encounter: Payer: Self-pay | Admitting: Family

## 2020-04-05 VITALS — BP 133/78 | HR 72 | Temp 97.8°F | Ht 60.0 in | Wt 132.8 lb

## 2020-04-05 DIAGNOSIS — Z1211 Encounter for screening for malignant neoplasm of colon: Secondary | ICD-10-CM

## 2020-04-05 DIAGNOSIS — Z1159 Encounter for screening for other viral diseases: Secondary | ICD-10-CM

## 2020-04-05 DIAGNOSIS — E039 Hypothyroidism, unspecified: Secondary | ICD-10-CM | POA: Diagnosis not present

## 2020-04-05 DIAGNOSIS — E559 Vitamin D deficiency, unspecified: Secondary | ICD-10-CM

## 2020-04-05 DIAGNOSIS — E785 Hyperlipidemia, unspecified: Secondary | ICD-10-CM

## 2020-04-05 DIAGNOSIS — J8410 Pulmonary fibrosis, unspecified: Secondary | ICD-10-CM

## 2020-04-05 DIAGNOSIS — E663 Overweight: Secondary | ICD-10-CM

## 2020-04-05 DIAGNOSIS — J4540 Moderate persistent asthma, uncomplicated: Secondary | ICD-10-CM | POA: Diagnosis not present

## 2020-04-05 DIAGNOSIS — G3184 Mild cognitive impairment, so stated: Secondary | ICD-10-CM

## 2020-04-05 MED ORDER — DONEPEZIL HCL 5 MG PO TABS: 5.0000 mg | ORAL_TABLET | Freq: Every day | ORAL | 2 refills | Status: DC

## 2020-04-05 MED ORDER — LEVOTHYROXINE SODIUM 88 MCG PO TABS: 88.0000 ug | ORAL_TABLET | Freq: Every day | ORAL | 2 refills | Status: DC

## 2020-04-05 NOTE — Patient Instructions (Signed)
Health Maintenance After Age 78 After age 78, you are at a higher risk for certain long-term diseases and infections as well as injuries from falls. Falls are a major cause of broken bones and head injuries in people who are older than age 78. Getting regular preventive care can help to keep you healthy and well. Preventive care includes getting regular testing and making lifestyle changes as recommended by your health care provider. Talk with your health care provider about:  Which screenings and tests you should have. A screening is a test that checks for a disease when you have no symptoms.  A diet and exercise plan that is right for you. What should I know about screenings and tests to prevent falls? Screening and testing are the best ways to find a health problem early. Early diagnosis and treatment give you the best chance of managing medical conditions that are common after age 78. Certain conditions and lifestyle choices may make you more likely to have a fall. Your health care provider may recommend:  Regular vision checks. Poor vision and conditions such as cataracts can make you more likely to have a fall. If you wear glasses, make sure to get your prescription updated if your vision changes.  Medicine review. Work with your health care provider to regularly review all of the medicines you are taking, including over-the-counter medicines. Ask your health care provider about any side effects that may make you more likely to have a fall. Tell your health care provider if any medicines that you take make you feel dizzy or sleepy.  Osteoporosis screening. Osteoporosis is a condition that causes the bones to get weaker. This can make the bones weak and cause them to break more easily.  Blood pressure screening. Blood pressure changes and medicines to control blood pressure can make you feel dizzy.  Strength and balance checks. Your health care provider may recommend certain tests to check your  strength and balance while standing, walking, or changing positions.  Foot health exam. Foot pain and numbness, as well as not wearing proper footwear, can make you more likely to have a fall.  Depression screening. You may be more likely to have a fall if you have a fear of falling, feel emotionally low, or feel unable to do activities that you used to do.  Alcohol use screening. Using too much alcohol can affect your balance and may make you more likely to have a fall. What actions can I take to lower my risk of falls? General instructions  Talk with your health care provider about your risks for falling. Tell your health care provider if: ? You fall. Be sure to tell your health care provider about all falls, even ones that seem minor. ? You feel dizzy, sleepy, or off-balance.  Take over-the-counter and prescription medicines only as told by your health care provider. These include any supplements.  Eat a healthy diet and maintain a healthy weight. A healthy diet includes low-fat dairy products, low-fat (lean) meats, and fiber from whole grains, beans, and lots of fruits and vegetables. Home safety  Remove any tripping hazards, such as rugs, cords, and clutter.  Install safety equipment such as grab bars in bathrooms and safety rails on stairs.  Keep rooms and walkways well-lit. Activity   Follow a regular exercise program to stay fit. This will help you maintain your balance. Ask your health care provider what types of exercise are appropriate for you.  If you need a cane or   walker, use it as recommended by your health care provider.  Wear supportive shoes that have nonskid soles. Lifestyle  Do not drink alcohol if your health care provider tells you not to drink.  If you drink alcohol, limit how much you have: ? 0-1 drink a day for women. ? 0-2 drinks a day for men.  Be aware of how much alcohol is in your drink. In the [place] one drink equals one typical bottle of beer (12  oz), one-half glass of wine (5 oz), or one shot of hard liquor (1 oz).  Do not use any products that contain nicotine or tobacco, such as cigarettes and e-cigarettes. If you need help quitting, ask your health care provider. Summary  Having a healthy lifestyle and getting preventive care can help to protect your health and wellness after age 78.  Screening and testing are the best way to find a health problem early and help you avoid having a fall. Early diagnosis and treatment give you the best chance for managing medical conditions that are more common for people who are older than age 78.  Falls are a major cause of broken bones and head injuries in people who are older than age 78. Take precautions to prevent a fall at home.  Work with your health care provider to learn what changes you can make to improve your health and wellness and to prevent falls. This information is not intended to replace advice given to you by your health care provider. Make sure you discuss any questions you have with your health care provider. Document Revised: 02/02/2019 Document Reviewed: 08/25/2017 Elsevier Patient Education  2020 Elsevier Inc.  

## 2020-04-05 NOTE — Progress Notes (Signed)
Subjective:    Patient ID: [name], female    DOB: September 19, 1941, 79 y.o.   MRN: [id number]  Chief Complaint  Patient presents with  . Medical Management of Chronic Issues   Pt presents to the office today for chronic follow up. Pt was followed by Pulmonologist's annually, but was told she only needed to come as needed . She is followed by the Neurologists 6 months for memory changes and neurocognitive disorder.   Asthma There is no cough, shortness of breath or wheezing. This is a chronic problem. The current episode started more than 1 year ago. The problem occurs intermittently. The problem has been resolved. Pertinent negatives include no sneezing. Her past medical history is significant for asthma.  Thyroid Problem Presents for follow-up visit. Patient reports no anxiety, depressed mood, diarrhea, fatigue or hair loss. The symptoms have been stable. Her past medical history is significant for hyperlipidemia.  Hyperlipidemia This is a chronic problem. The current episode started more than 1 year ago. The problem is uncontrolled. Recent lipid tests were reviewed and are high. Exacerbating diseases include obesity. Pertinent negatives include no shortness of breath. Current antihyperlipidemic treatment includes diet change. The current treatment provides no improvement of lipids. Risk factors for coronary artery disease include diabetes mellitus, dyslipidemia, hypertension and a sedentary lifestyle.      Review of Systems  Constitutional: Negative for fatigue.  HENT: Negative for sneezing.   Respiratory: Negative for cough, shortness of breath and wheezing.   Gastrointestinal: Negative for diarrhea.  Psychiatric/Behavioral: The patient is not nervous/anxious.   All other systems reviewed and are negative.      Objective:   Physical Exam Vitals reviewed.  Constitutional:      General: She is not in acute distress.    Appearance: She is well-developed.  HENT:     Head:  Normocephalic and atraumatic.     Right Ear: Tympanic membrane normal.     Left Ear: Tympanic membrane normal.  Eyes:     Pupils: Pupils are equal, round, and reactive to light.  Neck:     Thyroid: No thyromegaly.  Cardiovascular:     Rate and Rhythm: Normal rate and regular rhythm.     Heart sounds: Normal heart sounds. No murmur heard.   Pulmonary:     Effort: Pulmonary effort is normal. No respiratory distress.     Breath sounds: Normal breath sounds. No wheezing.  Abdominal:     General: Bowel sounds are normal. There is no distension.     Palpations: Abdomen is soft.     Tenderness: There is no abdominal tenderness.  Musculoskeletal:        General: No tenderness. Normal range of motion.     Cervical back: Normal range of motion and neck supple.  Skin:    General: Skin is warm and dry.  Neurological:     Mental Status: She is alert and oriented to person, place, and time.     Cranial Nerves: No cranial nerve deficit.     Deep Tendon Reflexes: Reflexes are normal and symmetric.  Psychiatric:        Behavior: Behavior normal.        Thought Content: Thought content normal.        Judgment: Judgment normal.       BP 133/78   Pulse 72   Temp 97.8 F (36.6 C) (Temporal)   Ht 5' (1.524 m)   Wt 132 lb 12.8 oz (60.2 kg)   SpO2  94%   BMI 25.94 kg/m      Assessment & Plan:  [name] comes in today with chief complaint of Medical Management of Chronic Issues   Diagnosis and orders addressed:  1. Hypothyroidism, unspecified type - levothyroxine (SYNTHROID) 88 MCG tablet; Take 1 tablet (88 mcg total) by mouth daily.  Dispense: 90 tablet; Refill: 2 - CMP14+EGFR - CBC with Differential/Platelet  2. Moderate persistent asthma in adult without complication - CMP14+EGFR - CBC with Differential/Platelet  3. Postinflammatory pulmonary fibrosis (HCC) - CMP14+EGFR - CBC with Differential/Platelet  4. Need for hepatitis C screening test - CMP14+EGFR - CBC with  Differential/Platelet - Hepatitis C antibody  5. Hyperlipidemia, unspecified hyperlipidemia type  6. Mild neurocognitive disorder  7. Overweight (BMI 25.0-29.9)  8. Vitamin D deficiency  9. Colon cancer screening - Cologuard   Labs pending Health Maintenance reviewed Diet and exercise encouraged  Follow up plan: 6 months     , FNP  

## 2020-04-06 LAB — CMP14+EGFR
ALT: 14 IU/L (ref 0–32)
AST: 18 IU/L (ref 0–40)
Albumin/Globulin Ratio: 1.9 (ref 1.2–2.2)
Albumin: 4.4 g/dL (ref 3.7–4.7)
Alkaline Phosphatase: 63 IU/L (ref 48–121)
BUN/Creatinine Ratio: 18 (ref 12–28)
BUN: 14 mg/dL (ref 8–27)
Bilirubin Total: 0.5 mg/dL (ref 0.0–1.2)
CO2: 23 mmol/L (ref 20–29)
Calcium: 9.5 mg/dL (ref 8.7–10.3)
Chloride: 108 mmol/L — ABNORMAL HIGH (ref 96–106)
Creatinine, Ser: 0.77 mg/dL (ref 0.57–1.00)
GFR calc Af Amer: 86 mL/min/{1.73_m2} (ref >59)
GFR calc non Af Amer: 74 mL/min/{1.73_m2} (ref >59)
Globulin, Total: 2.3 g/dL (ref 1.5–4.5)
Glucose: 81 mg/dL (ref 65–99)
Potassium: 4.2 mmol/L (ref 3.5–5.2)
Sodium: 144 mmol/L (ref 134–144)
Total Protein: 6.7 g/dL (ref 6.0–8.5)

## 2020-04-06 LAB — HEPATITIS C ANTIBODY: Hep C Virus Ab: <0.1 s/co ratio (ref 0.0–0.9)

## 2020-04-06 LAB — CBC WITH DIFFERENTIAL/PLATELET
Basophils Absolute: 0 10*3/uL (ref 0.0–0.2)
Basos: 0 %
EOS (ABSOLUTE): 0.1 10*3/uL (ref 0.0–0.4)
Eos: 2 %
Hematocrit: 39.5 % (ref 34.0–46.6)
Hemoglobin: 13.4 g/dL (ref 11.1–15.9)
Immature Grans (Abs): 0 10*3/uL (ref 0.0–0.1)
Immature Granulocytes: 0 %
Lymphocytes Absolute: 1.6 10*3/uL (ref 0.7–3.1)
Lymphs: 24 %
MCH: 31 pg (ref 26.6–33.0)
MCHC: 33.9 g/dL (ref 31.5–35.7)
MCV: 91 fL (ref 79–97)
Monocytes Absolute: 0.5 10*3/uL (ref 0.1–0.9)
Monocytes: 8 %
Neutrophils Absolute: 4.4 10*3/uL (ref 1.4–7.0)
Neutrophils: 66 %
Platelets: 235 10*3/uL (ref 150–450)
RBC: 4.32 x10E6/uL (ref 3.77–5.28)
RDW: 13.1 % (ref 11.7–15.4)
WBC: 6.7 10*3/uL (ref 3.4–10.8)

## 2020-04-09 NOTE — Progress Notes (Signed)
[facility]    Provider:  Dr [name] Requesting Provider: Junie Spencer, FNP Primary Care Provider:  Junie Spencer, FNP  CC: Mild neurocognitive disorder diagnosed by formal memory testing  04/10/2020: She is doing well. She has decreased heart rate, decreased since we started Donepezil, asymptomatic, we discussed the new biogen medication and discussed pathophysiology, she hs pulse of 55 and started her on donepezil asymptomatic however given brdycardia will order a 2-week holter monitor patch and if her heart rate is actually better that what we see today than we can try to increase the donepezil. She is here with her son who also provides much information. No falls. Memory stable. Maybe her memory is just a tad bit worse, when she is out of the house it is worse, out of her comfort zone, may be mild anxiety. Her repeat formal memory testing was not significantly progressed will repeat again in 12-18 months.   Patient complains of symptoms per HPI as well as the following symptoms: memory loss, anxiety . Pertinent negatives and positives per HPI. All others negative   Virtual Visit via Video Note  I connected with [name] on 11/29/2019 at  9:00 AM EDT by a video enabled telemedicine application and verified that I am speaking with the correct person using two identifiers.  Location: Patient: home Provider: office   I discussed the limitations of evaluation and management by telemedicine and the availability of in person appointments. The patient expressed understanding and agreed to proceed.  Follow Up Instructions:    I discussed the assessment and treatment plan with the patient. The patient was provided an opportunity to ask questions and all were answered. The patient agreed with the plan and demonstrated an understanding of the instructions.   The patient was advised to call back or seek an in-person evaluation if the symptoms worsen or if the  condition fails to improve as anticipated.  I provided 25 minutes of non-face-to-face time(we were on video speaking, face to face vitually with patient and son) during this encounter.   Gabrielle Fret, MD    Interval history November 29, 2019: I saw patient for follow-up with her son, both very lovely, MRI of the brain was normal for age, formal memory testing was consisted with mild neurocognitive disorder not dementia, however cannot rule out a prodromal Alzheimer's and we will have to really watch her for progression.  She had vomiting with the Namzaric which is a combination of donepezil and memantine, at this time we will just start her on donepezil very low-dose 5 mg they can even start with half a tab just to make sure she does not have any side effects.  Then we can increase it when we see her in 3 to 4 months in the office or via video.  They can always reach me by email or phone calls if anything happens.  HPI:  [name] is a 79 y.o. female here as requested by Gabrielle Spencer, FNP for memory problems.  Past medical history asthma, hyperlipidemia, overweight, hypothyroidism, vitamin D deficiency, memory problem. Here with her son who also provides information. She says she forgets things, she tries to write things done and she loses the paper and finds whatever she is looking for, started at least a year ago per son. She has a form of ADD and son thought she was forgetful due to this but noticed it more a year ago. Son says they spent the day  together and they went to [employer], she paid him for groceries and then forgot and says again she needed to pay him in the same day. She forgets appointments, has to tell patient multiple times [employer] the same day about things, more short-term memory, she lives with her husband and he is in ICU and the last 2 weeks it has been worse since she is going to the hospital and there is stress. Short-term memory progressive. Her energy is poor. Anxiety is  worsening but memory changes started prior to this increased anxiety. She is paying all the bills now, husband helps her and she writes it down, not missing any bills and doing well, son says she has double paid even up to 2 years ago bc she forget she already paid.She is able to take medications, not missing any medications, no difficulties driving, she says she was lost once. But she has ADD and her brain "wanders off". No other focal neurologic deficits, associated symptoms, inciting events or modifiable factors. Sister had Alzheimer's, died at 6390 and had it 15 years.   Reviewed notes, labs and imaging from outside physicians, which showed:   I reviewed [name]' notes.  Patient very anxious about her memory.  Patient requested a neurology referral.  She was encouraged to write notes.  She was started on memantine and donepezil (Namzaric) titration up to 28 mg memantine with 10 mg donepezil.  Mini-Mental status exam was 28 out of 30.  This was July 27, 2019.  Labs taken the same day include normal TSH, normal CBC, normal CMP, excellent vitamin D levels.  Review of Systems: Patient complains of symptoms per HPI as well as the following symptoms: memory loss. Pertinent negatives and positives per HPI. All others negative.   Social History   Socioeconomic History  . Marital status: Married    Spouse name: Not on file  . Number of children: 3  . Years of education: 211  . Highest education level: Not on file  Occupational History  . Occupation: retired Scientist, product/process developmenttextile worker  Tobacco Use  . Smoking status: Never Smoker  . Smokeless tobacco: Never Used  Vaping Use  . Vaping Use: Never used  Substance and Sexual Activity  . Alcohol use: No  . Drug use: No  . Sexual activity: Not on file  Other Topics Concern  . Not on file  Social History Narrative   Lives at home alone    Right handed   Social Determinants of Health   Financial Resource Strain:   . Difficulty of Paying Living  Expenses:   Food Insecurity:   . Worried About Programme researcher, broadcasting/film/videounning Out of Food in the Last Year:   . Baristaan Out of Food in the Last Year:   Transportation Needs:   . Freight forwarderLack of Transportation (Medical):   Marland Kitchen. Lack of Transportation (Non-Medical):   Physical Activity:   . Days of Exercise per Week:   . Minutes of Exercise per Session:   Stress:   . Feeling of Stress :   Social Connections:   . Frequency of Communication with Friends and Family:   . Frequency of Social Gatherings with Friends and Family:   . Attends Religious Services:   . Active Member of Clubs or Organizations:   . Attends BankerClub or Organization Meetings:   Marland Kitchen. Marital Status:   Intimate Partner Violence:   . Fear of Current or Ex-Partner:   . Emotionally Abused:   Marland Kitchen. Physically Abused:   . Sexually Abused:  Family History  Problem Relation Age of Onset  . Heart failure Mother   . Emphysema Mother   . Other Father        blood clot in brain  . Breast cancer Sister   . Cancer Sister        breast  . Memory loss Sister   . Breast cancer Sister   . Cancer Sister        breast  . Asthma Sister   . Cancer Brother        lung  . Memory loss Sister   . Anesthesia problems Neg Hx   . Hypotension Neg Hx   . Malignant hyperthermia Neg Hx   . Pseudochol deficiency Neg Hx     Past Medical History:  Diagnosis Date  . Allergic rhinitis 01/08/2010   Qualifier: Diagnosis of  By: Delford Field MD, Charlcie Cradle   . Asthma   . Gallstone pancreatitis   . Hiatal hernia 03/09/2018  . HLD (hyperlipidemia) 03/28/2013  . Hypothyroidism   . Mild neurocognitive disorder 09/06/2019  . Osteoporosis   . Postinflammatory pulmonary fibrosis (HCC)   . Vertigo   . Vitamin D deficiency 11/21/2015    Patient Active Problem List   Diagnosis Date Noted  . Mild neurocognitive disorder 09/06/2019  . Hiatal hernia 03/09/2018  . Overweight (BMI 25.0-29.9) 08/14/2016  . Osteoporosis 12/02/2015  . Vitamin D deficiency 11/21/2015  . HLD (hyperlipidemia)  03/28/2013  . Allergic rhinitis 01/08/2010  . Hypothyroidism 07/03/2007  . Moderate persistent asthma in adult without complication 07/03/2007  . Postinflammatory pulmonary fibrosis (HCC) 07/03/2007    Past Surgical History:  Procedure Laterality Date  . CATARACT EXTRACTION W/PHACO  10/01/2011   Procedure: CATARACT EXTRACTION PHACO AND INTRAOCULAR LENS PLACEMENT (IOC);  Surgeon: Gemma Payor;  Location: [facility];  Service: Ophthalmology;  Laterality: Left;  CDE:13.74  . CATARACT EXTRACTION W/PHACO  10/22/2011   Procedure: CATARACT EXTRACTION PHACO AND INTRAOCULAR LENS PLACEMENT (IOC);  Surgeon: Gemma Payor;  Location: [facility];  Service: Ophthalmology;  Laterality: Right;  CDE:9.98  . CHOLECYSTECTOMY  2010  . TUBAL LIGATION      Current Outpatient Medications  Medication Sig Dispense Refill  . albuterol (PROAIR HFA) 108 (90 Base) MCG/ACT inhaler Inhale 2 puffs into the lungs every 6 (six) hours as needed for wheezing or shortness of breath. 18 g 11  . aspirin EC 81 MG tablet Take 81 mg by mouth daily.      . Biotin 5000 MCG CAPS Take 5,000 mcg by mouth daily.    . Cholecalciferol (VITAMIN D-3) 1000 UNITS CAPS Take 5,000 Units by mouth daily.     Marland Kitchen donepezil (ARICEPT) 5 MG tablet Take 1 tablet (5 mg total) by mouth at bedtime. 90 tablet 2  . ibuprofen (ADVIL,MOTRIN) 200 MG tablet Take 400 mg by mouth every 6 (six) hours as needed for mild pain.    Marland Kitchen levothyroxine (SYNTHROID) 88 MCG tablet Take 1 tablet (88 mcg total) by mouth daily. 90 tablet 2  . Multiple Vitamins-Minerals (CENTRUM SILVER PO) Take 1 tablet by mouth daily. Reported on 12/12/2015     No current facility-administered medications for this visit.    Allergies as of 04/10/2020 - Review Complete 04/10/2020  Allergen Reaction Noted  . Sulfonamide derivatives  12/15/2007    Vitals: BP (!) 146/78 (BP Location: Right Arm, Patient Position: Sitting)   Pulse (!) 53   Ht 5' (1.524 m)   Wt 132 lb (59.9 kg)   BMI 25.78 kg/m  Last  Weight:  Wt Readings from Last 1 Encounters:  04/10/20 132 lb (59.9 kg)   Last Height:   Ht Readings from Last 1 Encounters:  04/10/20 5' (1.524 m)     Physical exam: Exam: Gen: NAD, conversant, well nourised, obese, well groomed                     CV: bradycardia, no MRG. No Carotid Bruits. No peripheral edema, warm, nontender Eyes: Conjunctivae clear without exudates or hemorrhage  Neuro: Detailed Neurologic Exam  Speech:    Speech is normal; fluent and spontaneous with normal comprehension.  Cognition:  MMSE - Mini Mental State Exam 08/01/2019 07/27/2019  Orientation to time 3 5  Orientation to Place 3 5  Registration 3 3  Attention/ Calculation 1 3  Recall 1 3  Language- name 2 objects 1 2  Language- repeat 0 1  Language- follow 3 step command 3 3  Language- read & follow direction 1 1  Write a sentence 1 1  Copy design 0 1  Total score 17 28   Cranial Nerves:    The pupils are equal, round, and reactive to light. . Visual fields are full to finger confrontation. Extraocular movements are intact. Trigeminal sensation is intact and the muscles of mastication are normal. The face is symmetric. The palate elevates in the midline. Hearing intact. Voice is normal. Shoulder shrug is normal. The tongue has normal motion without fasciculations.   Gait:    Good stride, not shuffling  Motor Observation:    No asymmetry, no atrophy, and no involuntary movements noted. Tone:    Normal muscle tone.    Posture:    Posture is normal.     Strength:    Strength is V/V in the upper and lower limbs.      Sensation: intact to LT     Reflex Exam:  DTR's:    Absent AJs otherwise deep tendon reflexes in the upper and lower extremities are brisk bilaterally.      Assessment/Plan:   79 y.o. female here as requested by Sharion Balloon, FNP for memory problems.  Past medical history asthma, hyperlipidemia, overweight, hypothyroidism, vitamin D deficiency, memory problem.   Diagnosed with mild neurocognitive disorder possibly prodromal Alzheimer's, had repeat testing,  but we have to watch her for progression, consider repeating testing in 12-18 months.   - Christy Adkins started Sundance, she had reaction, we stopped it and will try very low dose Aricept 5mg . She is doing well but has bradycardia, this can be a side effect of Aricept.  She is doing so well on it and had to stop it and I would actually like to increase it to 10 mg.  I will order a 2-week Holter monitor patch just to see if today's pulse (55) is may be an outlier, in the past her pulse is always been in the 70s.  - MRI brain and lab work for reversible causes of dementia was essentially normal for age - Formal neurocognitive testing c/w mild neurocognitive disorder.  We discussed that she was not diagnosed with dementia, mild neurocognitive disorder, third of people may progress to dementia, third may get better, third may stay the same, but given her deficits this may also be a prodromal Alzheimer's.  Slightly progressed but overall not significant.  -We will see them back in  6 months, they can come into the office at that time if safe or change to video, at that time  we will repeat MMSE to follow patient.  No orders of the defined types were placed in this encounter.  I spent 30 minutes of face-to-face and non-face-to-face time with patient on the  1. Bradycardia   2. Mild neurocognitive disorder    diagnosis.  This included previsit chart review, lab review, study review, order entry, electronic health record documentation, patient education on the different diagnostic and therapeutic options, counseling and coordination of care, risks and benefits of management, compliance, or risk factor reduction   Cc: Gabrielle Spencer, FNP,  Naomie Dean, MD  [facility] [address] [place], [place] [address]  Phone [phone] Fax [phone]

## 2020-04-10 ENCOUNTER — Ambulatory Visit: Payer: Medicare Other | Admitting: Neurology

## 2020-04-10 ENCOUNTER — Encounter: Payer: Self-pay | Admitting: Neurology

## 2020-04-10 ENCOUNTER — Other Ambulatory Visit: Payer: Self-pay

## 2020-04-10 VITALS — BP 146/78 | HR 53 | Ht 60.0 in | Wt 132.0 lb

## 2020-04-10 DIAGNOSIS — G3184 Mild cognitive impairment, so stated: Secondary | ICD-10-CM

## 2020-04-10 DIAGNOSIS — R001 Bradycardia, unspecified: Secondary | ICD-10-CM | POA: Diagnosis not present

## 2020-04-10 NOTE — Patient Instructions (Addendum)
Continue current medications 2 week cardiac monitor   Donepezil tablets What is this medicine? DONEPEZIL (doe NEP e zil) is used to treat mild to moderate dementia caused by Alzheimer's disease. This medicine may be used for other purposes; ask your health care provider or pharmacist if you have questions. COMMON BRAND NAME(S): Aricept What should I tell my health care provider before I take this medicine? They need to know if you have any of these conditions:  asthma or other lung disease  difficulty passing urine  head injury  heart disease  history of irregular heartbeat  liver disease  seizures (convulsions)  stomach or intestinal disease, ulcers or stomach bleeding  an unusual or allergic reaction to donepezil, other medicines, foods, dyes, or preservatives  pregnant or trying to get pregnant  breast-feeding How should I use this medicine? Take this medicine by mouth with a glass of water. Follow the directions on the prescription label. You may take this medicine with or without food. Take this medicine at regular intervals. This medicine is usually taken before bedtime. Do not take it more often than directed. Continue to take your medicine even if you feel better. Do not stop taking except on your doctor's advice. If you are taking the 23 mg donepezil tablet, swallow it whole; do not cut, crush, or chew it. Talk to your pediatrician regarding the use of this medicine in children. Special care may be needed. Overdosage: If you think you have taken too much of this medicine contact a poison control center or emergency room at once. NOTE: This medicine is only for you. Do not share this medicine with others. What if I miss a dose? If you miss a dose, take it as soon as you can. If it is almost time for your next dose, take only that dose, do not take double or extra doses. What may interact with this medicine? Do not take this medicine with any of the following  medications:  certain medicines for fungal infections like itraconazole, fluconazole, posaconazole, and voriconazole  cisapride  dextromethorphan; quinidine  dronedarone  pimozide  quinidine  thioridazine This medicine may also interact with the following medications:  antihistamines for allergy, cough and cold  atropine  bethanechol  carbamazepine  certain medicines for bladder problems like oxybutynin, tolterodine  certain medicines for Parkinson's disease like benztropine, trihexyphenidyl  certain medicines for stomach problems like dicyclomine, hyoscyamine  certain medicines for travel sickness like scopolamine  dexamethasone  dofetilide  ipratropium  NSAIDs, medicines for pain and inflammation, like ibuprofen or naproxen  other medicines for Alzheimer's disease  other medicines that prolong the QT interval (cause an abnormal heart rhythm)  phenobarbital  phenytoin  rifampin, rifabutin or rifapentine  ziprasidone This list may not describe all possible interactions. Give your health care provider a list of all the medicines, herbs, non-prescription drugs, or dietary supplements you use. Also tell them if you smoke, drink alcohol, or use illegal drugs. Some items may interact with your medicine. What should I watch for while using this medicine? Visit your doctor or health care professional for regular checks on your progress. Check with your doctor or health care professional if your symptoms do not get better or if they get worse. You may get drowsy or dizzy. Do not drive, use machinery, or do anything that needs mental alertness until you know how this drug affects you. What side effects may I notice from receiving this medicine? Side effects that you should report to your doctor  or health care professional as soon as possible:  allergic reactions like skin rash, itching or hives, swelling of the face, lips, or tongue  feeling faint or lightheaded,  falls  loss of bladder control  seizures  signs and symptoms of a dangerous change in heartbeat or heart rhythm like chest pain; dizziness; fast or irregular heartbeat; palpitations; feeling faint or lightheaded, falls; breathing problems  signs and symptoms of infection like fever or chills; cough; sore throat; pain or trouble passing urine  signs and symptoms of liver injury like dark yellow or brown urine; general ill feeling or flu-like symptoms; light-colored stools; loss of appetite; nausea; right upper belly pain; unusually weak or tired; yellowing of the eyes or skin  slow heartbeat or palpitations  unusual bleeding or bruising  vomiting Side effects that usually do not require medical attention (report to your doctor or health care professional if they continue or are bothersome):  diarrhea, especially when starting treatment  headache  loss of appetite  muscle cramps  nausea  stomach upset This list may not describe all possible side effects. Call your doctor for medical advice about side effects. You may report side effects to FDA at 1-800-FDA-1088. Where should I keep my medicine? Keep out of reach of children. Store at room temperature between 15 and 30 degrees C (59 and 86 degrees F). Throw away any unused medicine after the expiration date. NOTE: This sheet is a summary. It may not cover all possible information. If you have questions about this medicine, talk to your doctor, pharmacist, or health care provider.  2020 Elsevier/Gold Standard (2018-10-03 10:33:41)

## 2020-04-15 DIAGNOSIS — Z1211 Encounter for screening for malignant neoplasm of colon: Secondary | ICD-10-CM | POA: Diagnosis not present

## 2020-04-19 LAB — COLOGUARD: COLOGUARD: NEGATIVE

## 2020-04-25 LAB — COLOGUARD: Cologuard: NEGATIVE

## 2020-05-09 ENCOUNTER — Telehealth: Payer: Self-pay | Admitting: Family

## 2020-05-09 NOTE — Telephone Encounter (Signed)
Pt aware of Cologuard results and voiced understanding.

## 2020-06-06 NOTE — Progress Notes (Signed)
CARDIOLOGY CONSULT NOTE       Patient ID: [name] MRN: [id number] DOB/AGE: 78/12/1940 78 y.o.  Admit date: (Not on file) Referring Physician: Lendon Colonel Primary Physician: Junie Spencer, FNP Primary Cardiologist: New Reason for Consultation: Bradycardia  Active Problems:   * No active hospital problems. *   HPI:  79 y.o. referred by Jannifer Rodney FNP for bradycardia. History of asthma, GERD, hypothyroidism, HLD pulmonary fibrosis and vertigo. She is on Aricept for dementia/memory TSH has been normal LDL 115 and lipitor dose increased October 2020 She sees Dr Lucia Gaskins who started the Aricept and noted her pulse decrease to 55 range which was lower than before starting medication Previously pulse was in 70's Note indicates monitor ordered 04/10/20    ECG today SR rate 63 normal PR 126 msec   ROS All other systems reviewed and negative except as noted above  Past Medical History:  Diagnosis Date  . Allergic rhinitis 01/08/2010   Qualifier: Diagnosis of  By: Delford Field MD, Charlcie Cradle   . Asthma   . Gallstone pancreatitis   . Hiatal hernia 03/09/2018  . HLD (hyperlipidemia) 03/28/2013  . Hypothyroidism   . Mild neurocognitive disorder 09/06/2019  . Osteoporosis   . Postinflammatory pulmonary fibrosis (HCC)   . Vertigo   . Vitamin D deficiency 11/21/2015    Family History  Problem Relation Age of Onset  . Heart failure Mother   . Emphysema Mother   . Other Father        blood clot in brain  . Breast cancer Sister   . Cancer Sister        breast  . Memory loss Sister   . Breast cancer Sister   . Cancer Sister        breast  . Asthma Sister   . Cancer Brother        lung  . Memory loss Sister   . Anesthesia problems Neg Hx   . Hypotension Neg Hx   . Malignant hyperthermia Neg Hx   . Pseudochol deficiency Neg Hx     Social History   Socioeconomic History  . Marital status: Widowed    Spouse name: Not on file  . Number of children: 3  . Years of education: 24  .  Highest education level: Not on file  Occupational History  . Occupation: retired Scientist, product/process development  Tobacco Use  . Smoking status: Never Smoker  . Smokeless tobacco: Never Used  Vaping Use  . Vaping Use: Never used  Substance and Sexual Activity  . Alcohol use: No  . Drug use: No  . Sexual activity: Not on file  Other Topics Concern  . Not on file  Social History Narrative   Lives at home alone    Right handed   Social Determinants of Health   Financial Resource Strain:   . Difficulty of Paying Living Expenses: Not on file  Food Insecurity:   . Worried About Programme researcher, broadcasting/film/video in the Last Year: Not on file  . Ran Out of Food in the Last Year: Not on file  Transportation Needs:   . Lack of Transportation (Medical): Not on file  . Lack of Transportation (Non-Medical): Not on file  Physical Activity:   . Days of Exercise per Week: Not on file  . Minutes of Exercise per Session: Not on file  Stress:   . Feeling of Stress : Not on file  Social Connections:   . Frequency of Communication with Friends  and Family: Not on file  . Frequency of Social Gatherings with Friends and Family: Not on file  . Attends Religious Services: Not on file  . Active Member of Clubs or Organizations: Not on file  . Attends Banker Meetings: Not on file  . Marital Status: Not on file  Intimate Partner Violence:   . Fear of Current or Ex-Partner: Not on file  . Emotionally Abused: Not on file  . Physically Abused: Not on file  . Sexually Abused: Not on file    Past Surgical History:  Procedure Laterality Date  . CATARACT EXTRACTION W/PHACO  10/01/2011   Procedure: CATARACT EXTRACTION PHACO AND INTRAOCULAR LENS PLACEMENT (IOC);  Surgeon: Gemma Payor;  Location: [facility];  Service: Ophthalmology;  Laterality: Left;  CDE:13.74  . CATARACT EXTRACTION W/PHACO  10/22/2011   Procedure: CATARACT EXTRACTION PHACO AND INTRAOCULAR LENS PLACEMENT (IOC);  Surgeon: Gemma Payor;  Location: [facility];   Service: Ophthalmology;  Laterality: Right;  CDE:9.98  . CHOLECYSTECTOMY  2010  . TUBAL LIGATION        Current Outpatient Medications:  .  albuterol (PROAIR HFA) 108 (90 Base) MCG/ACT inhaler, Inhale 2 puffs into the lungs every 6 (six) hours as needed for wheezing or shortness of breath., Disp: 18 g, Rfl: 11 .  aspirin EC 81 MG tablet, Take 81 mg by mouth daily.  , Disp: , Rfl:  .  Biotin 5000 MCG CAPS, Take 5,000 mcg by mouth daily., Disp: , Rfl:  .  Cholecalciferol (VITAMIN D-3) 1000 UNITS CAPS, Take 5,000 Units by mouth daily. , Disp: , Rfl:  .  donepezil (ARICEPT) 5 MG tablet, Take 1 tablet (5 mg total) by mouth at bedtime., Disp: 90 tablet, Rfl: 2 .  ibuprofen (ADVIL,MOTRIN) 200 MG tablet, Take 400 mg by mouth every 6 (six) hours as needed for mild pain., Disp: , Rfl:  .  levothyroxine (SYNTHROID) 88 MCG tablet, Take 1 tablet (88 mcg total) by mouth daily., Disp: 90 tablet, Rfl: 2 .  Multiple Vitamins-Minerals (CENTRUM SILVER PO), Take 1 tablet by mouth daily. Reported on 12/12/2015, Disp: , Rfl:     Physical Exam: Blood pressure 128/78, pulse 63, height 5' (1.524 m), weight 133 lb 3.2 oz (60.4 kg), SpO2 98 %.   Affect appropriate Healthy:  appears stated age HEENT: normal Neck supple with no adenopathy JVP normal no bruits no thyromegaly Lungs clear with no wheezing and good diaphragmatic motion Heart:  S1/S2 no murmur, no rub, gallop or click PMI normal Abdomen: benighn, BS positve, no tenderness, no AAA no bruit.  No HSM or HJR Distal pulses intact with no bruits No edema Neuro non-focal Skin warm and dry No muscular weakness    Lab Results  Component Value Date   WBC 6.7 04/05/2020   HGB 13.4 04/05/2020   HCT 39.5 04/05/2020   MCV 91 04/05/2020   PLT 235 04/05/2020   No results for input(s): NA, K, CL, CO2, BUN, CREATININE, CALCIUM, PROT, BILITOT, ALKPHOS, ALT, AST, GLUCOSE in the last 168 hours.  Invalid input(s): LABALBU No results found for: CKTOTAL, CKMB,  CKMBINDEX, TROPONINI  Lab Results  Component Value Date   CHOL 183 07/27/2019   CHOL 192 09/14/2018   CHOL 166 02/15/2018   Lab Results  Component Value Date   HDL 47 07/27/2019   HDL 49 09/14/2018   HDL 46 02/15/2018   Lab Results  Component Value Date   LDLCALC 115 (H) 07/27/2019   LDLCALC 121 (H) 09/14/2018  LDLCALC 94 02/15/2018   Lab Results  Component Value Date   TRIG 114 07/27/2019   TRIG 109 09/14/2018   TRIG 128 02/15/2018   Lab Results  Component Value Date   CHOLHDL 3.9 07/27/2019   CHOLHDL 3.9 09/14/2018   CHOLHDL 3.6 02/15/2018   No results found for: LDLDIRECT    Radiology: No results found.  EKG: SR rate 63 normal    ASSESSMENT AND PLAN:   1. 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 Asymptomatic Aricept has not helped would d/c it She does not need monitor and she will ship back when it comes  2. Thyroid:  TSH normal continue replacement 3. Asthma:  No active wheezing continue Proair HFA 4. Dementia:  Consider trial of Namenda f/u neurology   F/U with cardiology PRN  Signed: Charlton Haws 06/13/2020, 10:03 AM

## 2020-06-10 ENCOUNTER — Other Ambulatory Visit: Payer: Self-pay

## 2020-06-10 DIAGNOSIS — R001 Bradycardia, unspecified: Secondary | ICD-10-CM

## 2020-06-10 NOTE — Progress Notes (Signed)
Placed order for event monitor per Dr. Eden Emms.

## 2020-06-13 ENCOUNTER — Encounter: Payer: Self-pay | Admitting: Cardiovascular Disease

## 2020-06-13 ENCOUNTER — Ambulatory Visit: Payer: Medicare Other | Admitting: Cardiovascular Disease

## 2020-06-13 ENCOUNTER — Other Ambulatory Visit: Payer: Self-pay

## 2020-06-13 VITALS — BP 128/78 | HR 63 | Ht 60.0 in | Wt 133.2 lb

## 2020-06-13 DIAGNOSIS — R001 Bradycardia, unspecified: Secondary | ICD-10-CM

## 2020-06-13 NOTE — Patient Instructions (Addendum)
Medication Instructions:  *If you need a refill on your cardiac medications before your next appointment, please call your pharmacy*  Lab Work: If you have labs (blood work) drawn today and your tests are completely normal, you will receive your results only by: . MyChart Message (if you have MyChart) OR . A paper copy in the mail If you have any lab test that is abnormal or we need to change your treatment, we will call you to review the results.  Testing/Procedures: None ordered today.  Follow-Up: At [facility], you and your health needs are our priority.  As part of our continuing mission to provide you with exceptional heart care, we have created designated Provider Care Teams.  These Care Teams include your primary Cardiologist (physician) and Advanced Practice Providers (APPs -  Physician Assistants and Nurse Practitioners) who all work together to provide you with the care you need, when you need it.  We recommend signing up for the patient portal called "MyChart".  Sign up information is provided on this After Visit Summary.  MyChart is used to connect with patients for Virtual Visits (Telemedicine).  Patients are able to view lab/test results, encounter notes, upcoming appointments, etc.  Non-urgent messages can be sent to your provider as well.   To learn more about what you can do with MyChart, go to [email or web address].    Your next appointment:   As needed  The format for your next appointment:   In Person  Provider:   You may see Dr. Nishan or one of the following Advanced Practice Providers on your designated Care Team:    Lori Gerhardt, NP  Laura Ingold, NP  Jill McDaniel, NP   

## 2020-06-21 ENCOUNTER — Other Ambulatory Visit: Payer: Self-pay | Admitting: Family

## 2020-06-21 DIAGNOSIS — E039 Hypothyroidism, unspecified: Secondary | ICD-10-CM

## 2020-09-09 ENCOUNTER — Other Ambulatory Visit: Payer: Self-pay | Admitting: Family

## 2020-09-09 DIAGNOSIS — E039 Hypothyroidism, unspecified: Secondary | ICD-10-CM

## 2020-09-26 ENCOUNTER — Encounter: Payer: Self-pay | Admitting: *Deleted

## 2020-10-07 ENCOUNTER — Ambulatory Visit: Payer: Medicare Other | Admitting: Family

## 2020-10-30 ENCOUNTER — Ambulatory Visit: Payer: Medicare Other | Admitting: Neurology

## 2020-11-05 ENCOUNTER — Other Ambulatory Visit: Payer: Self-pay

## 2020-11-05 ENCOUNTER — Encounter: Payer: Self-pay | Admitting: Family

## 2020-11-05 ENCOUNTER — Ambulatory Visit (INDEPENDENT_AMBULATORY_CARE_PROVIDER_SITE_OTHER): Payer: Medicare Other | Admitting: Family

## 2020-11-05 ENCOUNTER — Ambulatory Visit (INDEPENDENT_AMBULATORY_CARE_PROVIDER_SITE_OTHER): Payer: Medicare Other

## 2020-11-05 ENCOUNTER — Other Ambulatory Visit: Payer: Self-pay | Admitting: Family

## 2020-11-05 VITALS — BP 139/80 | HR 69 | Temp 97.0°F | Ht 60.0 in | Wt 134.8 lb

## 2020-11-05 DIAGNOSIS — M25559 Pain in unspecified hip: Secondary | ICD-10-CM

## 2020-11-05 DIAGNOSIS — M810 Age-related osteoporosis without current pathological fracture: Secondary | ICD-10-CM

## 2020-11-05 DIAGNOSIS — E785 Hyperlipidemia, unspecified: Secondary | ICD-10-CM | POA: Diagnosis not present

## 2020-11-05 DIAGNOSIS — J4540 Moderate persistent asthma, uncomplicated: Secondary | ICD-10-CM

## 2020-11-05 DIAGNOSIS — M8588 Other specified disorders of bone density and structure, other site: Secondary | ICD-10-CM | POA: Diagnosis not present

## 2020-11-05 DIAGNOSIS — G3184 Mild cognitive impairment, so stated: Secondary | ICD-10-CM | POA: Diagnosis not present

## 2020-11-05 DIAGNOSIS — E039 Hypothyroidism, unspecified: Secondary | ICD-10-CM | POA: Diagnosis not present

## 2020-11-05 DIAGNOSIS — J301 Allergic rhinitis due to pollen: Secondary | ICD-10-CM

## 2020-11-05 DIAGNOSIS — E663 Overweight: Secondary | ICD-10-CM

## 2020-11-05 DIAGNOSIS — Z780 Asymptomatic menopausal state: Secondary | ICD-10-CM | POA: Diagnosis not present

## 2020-11-05 MED ORDER — ALENDRONATE SODIUM 70 MG PO TABS: 70.0000 mg | ORAL_TABLET | ORAL | 11 refills | Status: AC

## 2020-11-05 NOTE — Patient Instructions (Signed)
Osteoporosis  Osteoporosis happens when the bones become thin and less dense than normal. Osteoporosis makes bones more brittle and fragile and more likely to break (fracture). Over time, osteoporosis can cause your bones to become so weak that they fracture after a minor fall. Bones in the hip, wrist, and spine are most likely to fracture due to osteoporosis. What are the causes? The exact cause of this condition is not known. What increases the risk? You are more likely to develop this condition if you:  Have family members with this condition.  Have poor nutrition.  Use the following: ? Steroid medicines, such as prednisone. ? Anti-seizure medicines. ? Nicotine or tobacco, such as cigarettes, e-cigarettes, and chewing tobacco.  Are female.  Are age 50 or older.  Are not physically active (are sedentary).  Are of European or Asian descent.  Have a small body frame. What are the signs or symptoms? A fracture might be the first sign of osteoporosis, especially if the fracture results from a fall or injury that usually would not cause a bone to break. Other signs and symptoms include:  Pain in the neck or low back.  Stooped posture.  Loss of height. How is this diagnosed? This condition may be diagnosed based on:  Your medical history.  A physical exam.  A bone mineral density test, also called a DXA or DEXA test (dual-energy X-ray absorptiometry test). This test uses X-rays to measure the amount of minerals in your bones. How is this treated? This condition may be treated by:  Making lifestyle changes, such as: ? Including foods with more calcium and vitamin D in your diet. ? Doing weight-bearing and muscle-strengthening exercises. ? Stopping tobacco use. ? Limiting alcohol intake.  Taking medicine to slow the process of bone loss or to increase bone density.  Taking daily supplements of calcium and vitamin D.  Taking hormone replacement medicines, such as  estrogen for women and testosterone for men.  Monitoring your levels of calcium and vitamin D. The goal of treatment is to strengthen your bones and lower your risk for a fracture. Follow these instructions at home: Eating and drinking Include calcium and vitamin D in your diet. Calcium is important for bone health, and vitamin D helps your body absorb calcium. Good sources of calcium and vitamin D include:  Certain fatty fish, such as salmon and tuna.  Products that have calcium and vitamin D added to them (are fortified), such as fortified cereals.  Egg yolks.  Cheese.  Liver.   Activity Do exercises as told by your health care provider. Ask your health care provider what exercises and activities are safe for you. You should do:  Exercises that make you work against gravity (weight-bearing exercises), such as tai chi, yoga, or walking.  Exercises to strengthen muscles, such as lifting weights. Lifestyle  Do not drink alcohol if: ? Your health care provider tells you not to drink. ? You are pregnant, may be pregnant, or are planning to become pregnant.  If you drink alcohol: ? Limit how much you use to:  0-1 drink a day for women.  0-2 drinks a day for men.  Know how much alcohol is in your drink. In the [place] one drink equals one 12 oz bottle of beer (355 mL), one 5 oz glass of wine (148 mL), or one 1 oz glass of hard liquor (44 mL).  Do not use any products that contain nicotine or tobacco, such as cigarettes, e-cigarettes, and chewing tobacco.   If you need help quitting, ask your health care provider. Preventing falls  Use devices to help you move around (mobility aids) as needed, such as canes, walkers, scooters, or crutches.  Keep rooms well-lit and clutter-free.  Remove tripping hazards from walkways, including cords and throw rugs.  Install grab bars in bathrooms and safety rails on stairs.  Use rubber mats in the bathroom and other areas that are often wet or  slippery.  Wear closed-toe shoes that fit well and support your feet. Wear shoes that have rubber soles or low heels.  Review your medicines with your health care provider. Some medicines can cause dizziness or changes in blood pressure, which can increase your risk of falling. General instructions  Take over-the-counter and prescription medicines only as told by your health care provider.  Keep all follow-up visits. This is important. Contact a health care provider if:  You have never been screened for osteoporosis and you are: ? A woman who is age 65 or older. ? A man who is age 70 or older. Get help right away if:  You fall or injure yourself. Summary  Osteoporosis is thinning and loss of density in your bones. This makes bones more brittle and fragile and more likely to break (fracture),even with minor falls.  The goal of treatment is to strengthen your bones and lower your risk for a fracture.  Include calcium and vitamin D in your diet. Calcium is important for bone health, and vitamin D helps your body absorb calcium.  Talk with your health care provider about screening for osteoporosis if you are a woman who is age 65 or older, or a man who is age 70 or older. This information is not intended to replace advice given to you by your health care provider. Make sure you discuss any questions you have with your health care provider. Document Revised: 03/28/2020 Document Reviewed: 03/28/2020 Elsevier Patient Education  2021 [employer]  

## 2020-11-05 NOTE — Progress Notes (Signed)
Subjective:    Patient ID: [name], female    DOB: 04-28-1941, 80 y.o.   MRN: [id number]  Chief Complaint  Patient presents with  . Hypothyroidism    6 mth check up   . Asthma  . Hip Pain    Right side x 2 weeks no injury     Pt presents to the office today for chronic follow up. Pt was followed by Pulmonologist's annually, but was told she only needed to come as needed . She is followed by the Neurologists 6 months for memory changes and neurocognitive disorder.  Asthma There is no cough, shortness of breath or wheezing. This is a chronic problem. The current episode started more than 1 year ago. The problem occurs intermittently. The problem has been waxing and waning. She reports moderate improvement on treatment. Her past medical history is significant for asthma.  Hip Pain  The incident occurred more than 1 week ago. There was no injury mechanism. The pain is present in the right hip. The pain is at a severity of 7/10. The pain is moderate. The pain has been intermittent since onset.  Thyroid Problem Presents for follow-up visit. Symptoms include anxiety and fatigue. Patient reports no depressed mood, diarrhea or dry skin. The symptoms have been stable. Her past medical history is significant for hyperlipidemia.  Hyperlipidemia This is a chronic problem. The current episode started more than 1 year ago. Pertinent negatives include no shortness of breath. Current antihyperlipidemic treatment includes diet change. The current treatment provides mild improvement of lipids. Risk factors for coronary artery disease include dyslipidemia.  Osteoporosis  Pt currently not taking any medications for this. She admits not doing any exercises. Her last dexascan was 09/20/18.    Review of Systems  Constitutional: Positive for fatigue.  Respiratory: Negative for cough, shortness of breath and wheezing.   Gastrointestinal: Negative for diarrhea.  Psychiatric/Behavioral: The patient is  nervous/anxious.   All other systems reviewed and are negative.      Objective:   Physical Exam Vitals reviewed.  Constitutional:      General: She is not in acute distress.    Appearance: She is well-developed and well-nourished.  HENT:     Head: Normocephalic and atraumatic.     Right Ear: Tympanic membrane normal.     Left Ear: Tympanic membrane normal.     Mouth/Throat:     Mouth: Oropharynx is clear and moist.  Eyes:     Pupils: Pupils are equal, round, and reactive to light.  Neck:     Thyroid: No thyromegaly.  Cardiovascular:     Rate and Rhythm: Normal rate and regular rhythm.     Pulses: Intact distal pulses.     Heart sounds: Normal heart sounds. No murmur heard.   Pulmonary:     Effort: Pulmonary effort is normal. No respiratory distress.     Breath sounds: Normal breath sounds. No wheezing.  Abdominal:     General: Bowel sounds are normal. There is no distension.     Palpations: Abdomen is soft.     Tenderness: There is no abdominal tenderness.  Musculoskeletal:        General: No tenderness or edema. Normal range of motion.     Cervical back: Normal range of motion and neck supple.  Skin:    General: Skin is warm and dry.  Neurological:     Mental Status: She is alert and oriented to person, place, and time.     Cranial  Nerves: No cranial nerve deficit.     Deep Tendon Reflexes: Reflexes are normal and symmetric.  Psychiatric:        Mood and Affect: Mood and affect normal.        Behavior: Behavior normal.        Thought Content: Thought content normal.        Judgment: Judgment normal.       BP 139/80   Pulse 69   Temp (!) 97 F (36.1 C) (Temporal)   Ht 5' (1.524 m)   Wt 134 lb 12.8 oz (61.1 kg)   SpO2 99%   BMI 26.33 kg/m      Assessment & Plan:  [name] comes in today with chief complaint of Hypothyroidism (6 mth check up ), Asthma, and Hip Pain (Right side x 2 weeks no injury )   Diagnosis and orders addressed:  1.  Allergic rhinitis due to pollen, unspecified seasonality - CMP14+EGFR - CBC with Differential/Platelet  2. Moderate persistent asthma in adult without complication - BUB34+IEOD - CBC with Differential/Platelet  3. Hypothyroidism, unspecified type - CMP14+EGFR - CBC with Differential/Platelet - TSH  4. Osteoporosis, unspecified osteoporosis type, unspecified pathological fracture presence - DG [facility] DEXA - CMP14+EGFR - CBC with Differential/Platelet  5. Overweight (BMI 25.0-29.9) - CMP14+EGFR - CBC with Differential/Platelet  6. Hyperlipidemia, unspecified hyperlipidemia type - CMP14+EGFR - CBC with Differential/Platelet  7. Mild neurocognitive disorder - CMP14+EGFR - CBC with Differential/Platelet  8. Hip pain Continue motrin as needed   Labs pending Health Maintenance reviewed Diet and exercise encouraged  Follow up plan: 4 months    Evelina Dun, FNP

## 2020-11-06 LAB — CBC WITH DIFFERENTIAL/PLATELET
Basophils Absolute: 0.1 10*3/uL (ref 0.0–0.2)
Basos: 1 %
EOS (ABSOLUTE): 0.2 10*3/uL (ref 0.0–0.4)
Eos: 4 %
Hematocrit: 42.9 % (ref 34.0–46.6)
Hemoglobin: 14.4 g/dL (ref 11.1–15.9)
Immature Grans (Abs): 0 10*3/uL (ref 0.0–0.1)
Immature Granulocytes: 0 %
Lymphocytes Absolute: 1.4 10*3/uL (ref 0.7–3.1)
Lymphs: 23 %
MCH: 30.3 pg (ref 26.6–33.0)
MCHC: 33.6 g/dL (ref 31.5–35.7)
MCV: 90 fL (ref 79–97)
Monocytes Absolute: 0.5 10*3/uL (ref 0.1–0.9)
Monocytes: 9 %
Neutrophils Absolute: 3.7 10*3/uL (ref 1.4–7.0)
Neutrophils: 63 %
Platelets: 258 10*3/uL (ref 150–450)
RBC: 4.76 x10E6/uL (ref 3.77–5.28)
RDW: 12.5 % (ref 11.7–15.4)
WBC: 5.9 10*3/uL (ref 3.4–10.8)

## 2020-11-06 LAB — CMP14+EGFR
ALT: 17 IU/L (ref 0–32)
AST: 17 IU/L (ref 0–40)
Albumin/Globulin Ratio: 1.9 (ref 1.2–2.2)
Albumin: 4.8 g/dL — ABNORMAL HIGH (ref 3.7–4.7)
Alkaline Phosphatase: 66 IU/L (ref 44–121)
BUN/Creatinine Ratio: 15 (ref 12–28)
BUN: 13 mg/dL (ref 8–27)
Bilirubin Total: 0.4 mg/dL (ref 0.0–1.2)
CO2: 23 mmol/L (ref 20–29)
Calcium: 10.4 mg/dL — ABNORMAL HIGH (ref 8.7–10.3)
Chloride: 105 mmol/L (ref 96–106)
Creatinine, Ser: 0.84 mg/dL (ref 0.57–1.00)
GFR calc Af Amer: 77 mL/min/{1.73_m2} (ref >59)
GFR calc non Af Amer: 67 mL/min/{1.73_m2} (ref >59)
Globulin, Total: 2.5 g/dL (ref 1.5–4.5)
Glucose: 98 mg/dL (ref 65–99)
Potassium: 4.5 mmol/L (ref 3.5–5.2)
Sodium: 142 mmol/L (ref 134–144)
Total Protein: 7.3 g/dL (ref 6.0–8.5)

## 2020-11-06 LAB — TSH: TSH: 2.92 u[IU]/mL (ref 0.450–4.500)

## 2020-11-11 ENCOUNTER — Ambulatory Visit (INDEPENDENT_AMBULATORY_CARE_PROVIDER_SITE_OTHER): Payer: Medicare Other | Admitting: Pharmacist

## 2020-11-11 ENCOUNTER — Other Ambulatory Visit: Payer: Self-pay

## 2020-11-11 DIAGNOSIS — M8000XA Age-related osteoporosis with current pathological fracture, unspecified site, initial encounter for fracture: Secondary | ICD-10-CM | POA: Diagnosis not present

## 2020-11-11 NOTE — Progress Notes (Signed)
    11/11/2020 Name: [name] MRN: [id number] DOB: 27-Aug-1941  Current Height:  5'     Max Lifetime Height:  5'2" Current Weight:   134lb      Ethnicity:Caucasian   HPI: Previously diagnosed with osteopenia in 2001.  She was started on Fosamax but did not continue due to side effects - though she cannot recall type of SE experienced. Does pt already have a diagnosis of:  Osteoporosis?  Yes  Back Pain?  No       Kyphosis?  No Prior fracture?  No Med(s) for Osteoporosis/Osteopenia:  n/a Med(s) previously tried for Osteoporosis/Osteopenia:  fosamax                                                             PMH: Age at menopause:  21 Hysterectomy?  No Oophorectomy?  No HRT? No Steroid Use?  No Thyroid med?  Yes History of cancer?  No History of digestive disorders (ie Crohn's)?  No Current or previous eating disorders?  No Last Vitamin D Result:  69 (07/27/2019) Last GFR Result:  67 (11/05/20)   FH/SH: Family history of osteoporosis?  No Parent with history of hip fracture?  No Family history of breast cancer?  Yes 2 older sisters Exercise?  No-foot pain Smoking?  No Alcohol?  No    Calcium Assessment Calcium Intake  # of servings/day  Calcium mg  Milk (8 oz) 1  x  300  = 300  Yogurt (4 oz) 0 x  200 = 0  Cheese (1 oz) 0.5 x  200 = 100  Other Calcium sources   250mg   Ca supplement MVI = 400   Estimated calcium intake per day 1050mg     DEXA Results Date of Test T-Score for AP Spine L1-L4 T-Score for Total Left Hip T-Score for Total Right Hip  11/05/20 -1.2 -2.7 --  09/20/18 -1.2 -2.8 --  12/02/15 -0.9 -2.6 -1.3  01/02/02 -1.9 -1.8 --  12/29/99 -2.2 -1.8 --   FRAX 10 year estimate: Not calculated due to osteoporosis diagnosis  Assessment: Osteoporosis with low calcium intake & adequate vitamin D COMPARISON: 09/20/2018 and 12/02/2015. Since the most recent prior exam there has been no significant change in bone density for the lumbar spine, L2 through L4, or  total mean proximal femurs.  Recommendations: 1.  Start  alendronate (FOSAMAX)  2.  recommend calcium 1200mg  daily through supplementation or diet.  3.  recommend weight bearing exercise - 30 minutes at least 4 days per week.   4.  Counseled and educated about fall risk and prevention.  Recheck DEXA:  2 years  Time spent counseling patient:  20 minutes   09/22/2018, PharmD, BCPS Clinical Pharmacist, [facility] [place]  [place] Phone [phone]

## 2020-12-24 ENCOUNTER — Other Ambulatory Visit: Payer: Self-pay | Admitting: Family

## 2020-12-24 DIAGNOSIS — E039 Hypothyroidism, unspecified: Secondary | ICD-10-CM

## 2021-01-27 ENCOUNTER — Encounter: Payer: Self-pay | Admitting: Neurology

## 2021-01-27 ENCOUNTER — Ambulatory Visit: Payer: Medicare Other | Admitting: Neurology

## 2021-01-27 VITALS — BP 145/80 | HR 64 | Ht 60.0 in | Wt 138.0 lb

## 2021-01-27 DIAGNOSIS — G3184 Mild cognitive impairment, so stated: Secondary | ICD-10-CM | POA: Diagnosis not present

## 2021-01-27 NOTE — Patient Instructions (Signed)
1 year follow up

## 2021-01-27 NOTE — Progress Notes (Signed)
[facility]    Provider:  Dr Lucia Gaskins Requesting Provider: Junie Spencer, FNP Primary Care Provider:  Junie Spencer, FNP  CC: Mild neurocognitive disorder diagnosed by formal memory testing  01/27/2021: Has her good days and bad days. Still living in the same place. Appetite is good but she is not eating as much. Weight is stable. She orders out a lot, doesn't cook as much. No swllowing problems, no falls or accidents, she drives locally and no problems, she had formal memory testing Dr. Milbert Coulter in 08/2019 and suggested repeating. Son is here and sees some difference, not significantly different. This morning she didn't remember a conversation. Her younger son watches her and she does not miss a bill. No ireegularities with bank accounts, No balance problems. No exercise, discussed leading a more healthy lifestyle.   Patient complains of symptoms per HPI as well as the following symptoms: memory decline . Pertinent negatives and positives per HPI. All others negative .   04/10/2020: She is doing well. She has decreased heart rate, decreased since we started Donepezil, asymptomatic, we discussed the new biogen medication and discussed pathophysiology, she hs pulse of 55 and started her on donepezil asymptomatic however given brdycardia will order a 2-week holter monitor patch and if her heart rate is actually better that what we see today than we can try to increase the donepezil. She is here with her son who also provides much information. No falls. Memory stable. Maybe her memory is just a tad bit worse, when she is out of the house it is worse, out of her comfort zone, may be mild anxiety. Her repeat formal memory testing was not significantly progressed will repeat again in 12-18 months.   Patient complains of symptoms per HPI as well as the following symptoms: memory loss, anxiety . Pertinent negatives and positives per HPI. All others negative   Virtual Visit via Video  Note  I connected with Kashina P Rupe on 11/29/2019 at 11:00 AM EDT by a video enabled telemedicine application and verified that I am speaking with the correct person using two identifiers.  Location: Patient: home Provider: office   I discussed the limitations of evaluation and management by telemedicine and the availability of in person appointments. The patient expressed understanding and agreed to proceed.  Follow Up Instructions:    I discussed the assessment and treatment plan with the patient. The patient was provided an opportunity to ask questions and all were answered. The patient agreed with the plan and demonstrated an understanding of the instructions.   The patient was advised to call back or seek an in-person evaluation if the symptoms worsen or if the condition fails to improve as anticipated.  I provided 25 minutes of non-face-to-face time(we were on video speaking, face to face vitually with patient and son) during this encounter.   Anson Fret, MD    Interval history November 29, 2019: I saw patient for follow-up with her son, both very lovely, MRI of the brain was normal for age, formal memory testing was consisted with mild neurocognitive disorder not dementia, however cannot rule out a prodromal Alzheimer's and we will have to really watch her for progression.  She had vomiting with the Namzaric which is a combination of donepezil and memantine, at this time we will just start her on donepezil very low-dose 5 mg they can even start with half a tab just to make sure she does not have any side effects.  Then we  can increase it when we see her in 3 to 4 months in the office or via video.  They can always reach me by email or phone calls if anything happens.  HPI:  REIA VIERNES is a 80 y.o. female here as requested by Junie Spencer, FNP for memory problems.  Past medical history asthma, hyperlipidemia, overweight, hypothyroidism, vitamin D deficiency, memory  problem. Here with her son who also provides information. She says she forgets things, she tries to write things done and she loses the paper and finds whatever she is looking for, started at least a year ago per son. She has a form of ADD and son thought she was forgetful due to this but noticed it more a year ago. Son says they spent the day together and they went to [employer], she paid him for groceries and then forgot and says again she needed to pay him in the same day. She forgets appointments, has to tell patient multiple times in the same day about things, more short-term memory, she lives with her husband and he is in ICU and the last 2 weeks it has been worse since she is going to the hospital and there is stress. Short-term memory progressive. Her energy is poor. Anxiety is worsening but memory changes started prior to this increased anxiety. She is paying all the bills now, husband helps her and she writes it down, not missing any bills and doing well, son says she has double paid even up to 2 years ago bc she forget she already paid.She is able to take medications, not missing any medications, no difficulties driving, she says she was lost once. But she has ADD and her brain "wanders off". No other focal neurologic deficits, associated symptoms, inciting events or modifiable factors. Sister had Alzheimer's, died at 66 and had it 15 years.   Reviewed notes, labs and imaging from outside physicians, which showed:   I reviewed Marin Shutter' notes.  Patient very anxious about her memory.  Patient requested a neurology referral.  She was encouraged to write notes.  She was started on memantine and donepezil (Namzaric) titration up to 28 mg memantine with 10 mg donepezil.  Mini-Mental status exam was 28 out of 30.  This was July 27, 2019.  Labs taken the same day include normal TSH, normal CBC, normal CMP, excellent vitamin D levels.  Review of Systems: Patient complains of symptoms per HPI as  well as the following symptoms: memory loss. Pertinent negatives and positives per HPI. All others negative.   Social History   Socioeconomic History  . Marital status: Widowed    Spouse name: Not on file  . Number of children: 3  . Years of education: 19  . Highest education level: Not on file  Occupational History  . Occupation: retired Scientist, product/process development  Tobacco Use  . Smoking status: Never Smoker  . Smokeless tobacco: Never Used  Vaping Use  . Vaping Use: Never used  Substance and Sexual Activity  . Alcohol use: No  . Drug use: No  . Sexual activity: Not on file  Other Topics Concern  . Not on file  Social History Narrative   Lives at home alone    Right handed   Caffeine: 3 cups/day coffee, coke    Social Determinants of Health   Financial Resource Strain: Not on file  Food Insecurity: Not on file  Transportation Needs: Not on file  Physical Activity: Not on file  Stress: Not  on file  Social Connections: Not on file  Intimate Partner Violence: Not on file    Family History  Problem Relation Age of Onset  . Heart failure Mother   . Emphysema Mother   . Other Father        blood clot in brain  . Breast cancer Sister   . Cancer Sister        breast  . Memory loss Sister   . Breast cancer Sister   . Cancer Sister        breast  . Asthma Sister   . Cancer Brother        lung  . Memory loss Sister   . Anesthesia problems Neg Hx   . Hypotension Neg Hx   . Malignant hyperthermia Neg Hx   . Pseudochol deficiency Neg Hx     Past Medical History:  Diagnosis Date  . Allergic rhinitis 01/08/2010   Qualifier: Diagnosis of  By: Delford FieldWright MD, Charlcie CradlePatrick E   . Asthma   . Gallstone pancreatitis   . Hiatal hernia 03/09/2018  . HLD (hyperlipidemia) 03/28/2013  . Hypothyroidism   . Mild neurocognitive disorder 09/06/2019  . Osteoporosis   . Postinflammatory pulmonary fibrosis (HCC)   . Vertigo   . Vitamin D deficiency 11/21/2015    Patient Active Problem List    Diagnosis Date Noted  . Mild neurocognitive disorder 09/06/2019  . Hiatal hernia 03/09/2018  . Overweight (BMI 25.0-29.9) 08/14/2016  . Osteoporosis 12/02/2015  . Vitamin D deficiency 11/21/2015  . HLD (hyperlipidemia) 03/28/2013  . Allergic rhinitis 01/08/2010  . Hypothyroidism 07/03/2007  . Moderate persistent asthma in adult without complication 07/03/2007  . Postinflammatory pulmonary fibrosis (HCC) 07/03/2007    Past Surgical History:  Procedure Laterality Date  . CATARACT EXTRACTION W/PHACO  10/01/2011   Procedure: CATARACT EXTRACTION PHACO AND INTRAOCULAR LENS PLACEMENT (IOC);  Surgeon: Gemma PayorKerry Hunt;  Location: [facility];  Service: Ophthalmology;  Laterality: Left;  CDE:13.74  . CATARACT EXTRACTION W/PHACO  10/22/2011   Procedure: CATARACT EXTRACTION PHACO AND INTRAOCULAR LENS PLACEMENT (IOC);  Surgeon: Gemma PayorKerry Hunt;  Location: [facility];  Service: Ophthalmology;  Laterality: Right;  CDE:9.98  . CHOLECYSTECTOMY  2010  . TUBAL LIGATION      Current Outpatient Medications  Medication Sig Dispense Refill  . acetaminophen (TYLENOL) 500 MG tablet Take 500-1,000 mg by mouth as needed.    Marland Kitchen. albuterol (PROAIR HFA) 108 (90 Base) MCG/ACT inhaler Inhale 2 puffs into the lungs every 6 (six) hours as needed for wheezing or shortness of breath. 18 g 11  . alendronate (FOSAMAX) 70 MG tablet Take 1 tablet (70 mg total) by mouth every 7 (seven) days. Take with a full glass of water on an empty stomach. 4 tablet 11  . aspirin 81 MG chewable tablet Chew by mouth daily.    Janann August. EUTHYROX 88 MCG tablet Take 1 tablet by mouth once daily 90 tablet 3  . ibuprofen (ADVIL,MOTRIN) 200 MG tablet Take 400 mg by mouth every 6 (six) hours as needed for mild pain.    . Multiple Vitamins-Minerals (MULTIVITAL-M PO) Take by mouth. Vitafusion womens     No current facility-administered medications for this visit.    Allergies as of 01/27/2021 - Review Complete 01/27/2021  Allergen Reaction Noted  . Sulfonamide derivatives   12/15/2007    Vitals: BP (!) 145/80 (BP Location: Left Arm, Patient Position: Sitting)   Pulse 64   Ht 5' (1.524 m)   Wt 138 lb (62.6 kg)  BMI 26.95 kg/m  Last Weight:  Wt Readings from Last 1 Encounters:  01/27/21 138 lb (62.6 kg)   Last Height:   Ht Readings from Last 1 Encounters:  01/27/21 5' (1.524 m)     Physical exam: Stable  Exam: Gen: NAD, conversant, well nourised, well groomed                     CV: bradycardia, no MRG. No Carotid Bruits. No peripheral edema, warm, nontender Eyes: Conjunctivae clear without exudates or hemorrhage  Neuro: Stable Detailed Neurologic Exam  Speech:    Speech is normal; fluent and spontaneous with normal comprehension.  Cognition:  MMSE - Mini Mental State Exam 01/27/2021 08/01/2019 07/27/2019  Orientation to time 4 3 5   Orientation to Place 4 3 5   Registration 3 3 3   Attention/ Calculation 2 1 3   Recall 2 1 3   Language- name 2 objects 2 1 2   Language- repeat 1 0 1  Language- follow 3 step command 3 3 3   Language- read & follow direction 1 1 1   Write a sentence 1 1 1   Copy design 0 0 1  Total score 23 17 28    Cranial Nerves:    The pupils are equal, round, and reactive to light. . Visual fields are full to finger confrontation. Extraocular movements are intact. Trigeminal sensation is intact and the muscles of mastication are normal. The face is symmetric. The palate elevates in the midline. Hearing intact. Voice is normal. Shoulder shrug is normal. The tongue has normal motion without fasciculations.   Gait:    Good stride, not shuffling  Motor Observation:    No asymmetry, no atrophy, and no involuntary movements noted. Tone:    Normal muscle tone.    Posture:    Posture is normal.     Strength:    Strength is V/V in the upper and lower limbs.      Sensation: intact to LT     Reflex Exam:  DTR's:    Absent AJs otherwise deep tendon reflexes in the upper and lower extremities are brisk bilaterally.       Assessment/Plan:   80 y.o. female here as requested by , FNP for memory problems.  Past medical history asthma, hyperlipidemia, overweight, hypothyroidism, vitamin D deficiency, memory problem.  Diagnosed with mild neurocognitive disorder possibly prodromal Alzheimer's, had repeat testing,  but we have to watch her for progression, consider repeating testing in 12-18 months.   - Looking good, pleasant and lovely, we will see her back in one year but if there is a decline contact and we can reorder neurocognitive testing with Dr. whom they lived very much.  - Christy Hawks started Altus, she had reaction, we stopped it, also on Aricept she had  Bradycardia..  - MRI brain and lab work for reversible causes of dementia was essentially normal for age - Formal neurocognitive testing c/w mild neurocognitive disorder.  We discussed that she was not diagnosed with dementia, mild neurocognitive disorder, third of people may progress to dementia, third may get better, third may stay the same, but given her deficits this may also be a prodromal Alzheimer's.  Slightly progressed but overall not significant.  -We will see them back in  1 year, they can come into the office at that time if safe or change to video, at that time we will repeat MMSE to follow patient.  No orders of the defined types were placed in  this encounter.  I spent 30 minutes of face-to-face and non-face-to-face time with patient on the  1. Mild neurocognitive disorder    diagnosis.  This included previsit chart review, lab review, study review, order entry, electronic health record documentation, patient education on the different diagnostic and therapeutic options, counseling and coordination of care, risks and benefits of management, compliance, or risk factor reduction  Cc: Junie Spencer, FNP,  Naomie Dean, MD  [facility] [address] [place], [place]  [address]  Phone [phone] Fax [phone]

## 2021-02-18 DIAGNOSIS — Z1231 Encounter for screening mammogram for malignant neoplasm of breast: Secondary | ICD-10-CM | POA: Diagnosis not present

## 2021-02-26 ENCOUNTER — Encounter: Payer: Self-pay | Admitting: Family

## 2021-02-26 ENCOUNTER — Ambulatory Visit (INDEPENDENT_AMBULATORY_CARE_PROVIDER_SITE_OTHER): Payer: Medicare Other | Admitting: Family

## 2021-02-26 ENCOUNTER — Other Ambulatory Visit: Payer: Self-pay

## 2021-02-26 VITALS — BP 119/75 | HR 82 | Temp 98.3°F | Ht 60.0 in | Wt 137.8 lb

## 2021-02-26 DIAGNOSIS — E039 Hypothyroidism, unspecified: Secondary | ICD-10-CM

## 2021-02-26 DIAGNOSIS — E785 Hyperlipidemia, unspecified: Secondary | ICD-10-CM

## 2021-02-26 DIAGNOSIS — E663 Overweight: Secondary | ICD-10-CM

## 2021-02-26 DIAGNOSIS — J4540 Moderate persistent asthma, uncomplicated: Secondary | ICD-10-CM

## 2021-02-26 DIAGNOSIS — E559 Vitamin D deficiency, unspecified: Secondary | ICD-10-CM

## 2021-02-26 DIAGNOSIS — G3184 Mild cognitive impairment, so stated: Secondary | ICD-10-CM

## 2021-02-26 LAB — CMP14+EGFR
ALT: 17 IU/L (ref 0–32)
AST: 15 IU/L (ref 0–40)
Albumin/Globulin Ratio: 1.9 (ref 1.2–2.2)
Albumin: 4.5 g/dL (ref 3.7–4.7)
Alkaline Phosphatase: 47 IU/L (ref 44–121)
BUN/Creatinine Ratio: 20 (ref 12–28)
BUN: 16 mg/dL (ref 8–27)
Bilirubin Total: 0.4 mg/dL (ref 0.0–1.2)
CO2: 23 mmol/L (ref 20–29)
Calcium: 9.8 mg/dL (ref 8.7–10.3)
Chloride: 105 mmol/L (ref 96–106)
Creatinine, Ser: 0.81 mg/dL (ref 0.57–1.00)
Globulin, Total: 2.4 g/dL (ref 1.5–4.5)
Glucose: 96 mg/dL (ref 65–99)
Potassium: 4.4 mmol/L (ref 3.5–5.2)
Sodium: 141 mmol/L (ref 134–144)
Total Protein: 6.9 g/dL (ref 6.0–8.5)
eGFR: 74 mL/min/{1.73_m2} (ref >59)

## 2021-02-26 NOTE — Patient Instructions (Signed)
Health Maintenance After Age 79 After age 79, you are at a higher risk for certain long-term diseases and infections as well as injuries from falls. Falls are a major cause of broken bones and head injuries in people who are older than age 79. Getting regular preventive care can help to keep you healthy and well. Preventive care includes getting regular testing and making lifestyle changes as recommended by your health care provider. Talk with your health care provider about:  Which screenings and tests you should have. A screening is a test that checks for a disease when you have no symptoms.  A diet and exercise plan that is right for you. What should I know about screenings and tests to prevent falls? Screening and testing are the best ways to find a health problem early. Early diagnosis and treatment give you the best chance of managing medical conditions that are common after age 79. Certain conditions and lifestyle choices may make you more likely to have a fall. Your health care provider may recommend:  Regular vision checks. Poor vision and conditions such as cataracts can make you more likely to have a fall. If you wear glasses, make sure to get your prescription updated if your vision changes.  Medicine review. Work with your health care provider to regularly review all of the medicines you are taking, including over-the-counter medicines. Ask your health care provider about any side effects that may make you more likely to have a fall. Tell your health care provider if any medicines that you take make you feel dizzy or sleepy.  Osteoporosis screening. Osteoporosis is a condition that causes the bones to get weaker. This can make the bones weak and cause them to break more easily.  Blood pressure screening. Blood pressure changes and medicines to control blood pressure can make you feel dizzy.  Strength and balance checks. Your health care provider may recommend certain tests to check your  strength and balance while standing, walking, or changing positions.  Foot health exam. Foot pain and numbness, as well as not wearing proper footwear, can make you more likely to have a fall.  Depression screening. You may be more likely to have a fall if you have a fear of falling, feel emotionally low, or feel unable to do activities that you used to do.  Alcohol use screening. Using too much alcohol can affect your balance and may make you more likely to have a fall. What actions can I take to lower my risk of falls? General instructions  Talk with your health care provider about your risks for falling. Tell your health care provider if: ? You fall. Be sure to tell your health care provider about all falls, even ones that seem minor. ? You feel dizzy, sleepy, or off-balance.  Take over-the-counter and prescription medicines only as told by your health care provider. These include any supplements.  Eat a healthy diet and maintain a healthy weight. A healthy diet includes low-fat dairy products, low-fat (lean) meats, and fiber from whole grains, beans, and lots of fruits and vegetables. Home safety  Remove any tripping hazards, such as rugs, cords, and clutter.  Install safety equipment such as grab bars in bathrooms and safety rails on stairs.  Keep rooms and walkways well-lit. Activity  Follow a regular exercise program to stay fit. This will help you maintain your balance. Ask your health care provider what types of exercise are appropriate for you.  If you need a cane or walker,   use it as recommended by your health care provider.  Wear supportive shoes that have nonskid soles.   Lifestyle  Do not drink alcohol if your health care provider tells you not to drink.  If you drink alcohol, limit how much you have: ? 0-1 drink a day for women. ? 0-2 drinks a day for men.  Be aware of how much alcohol is in your drink. In the [place] one drink equals one typical bottle of beer (12  oz), one-half glass of wine (5 oz), or one shot of hard liquor (1 oz).  Do not use any products that contain nicotine or tobacco, such as cigarettes and e-cigarettes. If you need help quitting, ask your health care provider. Summary  Having a healthy lifestyle and getting preventive care can help to protect your health and wellness after age 79.  Screening and testing are the best way to find a health problem early and help you avoid having a fall. Early diagnosis and treatment give you the best chance for managing medical conditions that are more common for people who are older than age 79.  Falls are a major cause of broken bones and head injuries in people who are older than age 79. Take precautions to prevent a fall at home.  Work with your health care provider to learn what changes you can make to improve your health and wellness and to prevent falls. This information is not intended to replace advice given to you by your health care provider. Make sure you discuss any questions you have with your health care provider. Document Revised: 02/02/2019 Document Reviewed: 08/25/2017 Elsevier Patient Education  2021 Elsevier Inc.  

## 2021-02-26 NOTE — Progress Notes (Signed)
Subjective:    Patient ID: [name], female    DOB: 22-Jul-1941, 80 y.o.   MRN: [id number]  Chief Complaint  Patient presents with  . Medical Management of Chronic Issues    Pt presents to the office today for chronic follow up. Ptwasfollowed by Pulmonologist's annually, but was told she only needed to come as needed. She is followed by the Neurologists 6 months for memory changes and neurocognitive disorder. Asthma There is no cough, shortness of breath or wheezing. This is a chronic problem. The current episode started more than 1 year ago. The problem occurs intermittently. She reports moderate improvement on treatment. Her past medical history is significant for asthma.  Hyperlipidemia This is a chronic problem. The current episode started more than 1 year ago. Exacerbating diseases include obesity. Pertinent negatives include no shortness of breath. Current antihyperlipidemic treatment includes statins. The current treatment provides moderate improvement of lipids. Risk factors for coronary artery disease include dyslipidemia, hypertension and a sedentary lifestyle.  Thyroid Problem Presents for follow-up visit. Patient reports no anxiety, depressed mood, fatigue or hair loss. The symptoms have been stable. Her past medical history is significant for hyperlipidemia.      Review of Systems  Constitutional: Negative for fatigue.  Respiratory: Negative for cough, shortness of breath and wheezing.   Psychiatric/Behavioral: The patient is not nervous/anxious.   All other systems reviewed and are negative.      Objective:   Physical Exam Vitals reviewed.  Constitutional:      General: She is not in acute distress.    Appearance: She is well-developed.  HENT:     Head: Normocephalic and atraumatic.     Right Ear: Tympanic membrane normal.     Left Ear: Tympanic membrane normal.  Eyes:     Pupils: Pupils are equal, round, and reactive to light.  Neck:     Thyroid:  No thyromegaly.  Cardiovascular:     Rate and Rhythm: Normal rate and regular rhythm.     Heart sounds: Normal heart sounds. No murmur heard.   Pulmonary:     Effort: Pulmonary effort is normal. No respiratory distress.     Breath sounds: Normal breath sounds. No wheezing.  Abdominal:     General: Bowel sounds are normal. There is no distension.     Palpations: Abdomen is soft.     Tenderness: There is no abdominal tenderness.  Musculoskeletal:        General: No tenderness. Normal range of motion.     Cervical back: Normal range of motion and neck supple.  Skin:    General: Skin is warm and dry.  Neurological:     Mental Status: She is alert and oriented to person, place, and time.     Cranial Nerves: No cranial nerve deficit.     Deep Tendon Reflexes: Reflexes are normal and symmetric.  Psychiatric:        Behavior: Behavior normal.        Thought Content: Thought content normal.        Judgment: Judgment normal.       BP 119/75   Pulse 82   Temp 98.3 F (36.8 C) (Temporal)   Ht 5' (1.524 m)   Wt 137 lb 12.8 oz (62.5 kg)   BMI 26.91 kg/m      Assessment & Plan:  [name] comes in today with chief complaint of Medical Management of Chronic Issues   Diagnosis and orders addressed:  1. Hypothyroidism,  unspecified type - CMP14+EGFR  2. Moderate persistent asthma in adult without complication - RQS12+KSKS  3. Hyperlipidemia, unspecified hyperlipidemia type - CMP14+EGFR  4. Mild neurocognitive disorder - CMP14+EGFR  5. Overweight (BMI 25.0-29.9) - CMP14+EGFR  6. Vitamin D deficiency - CMP14+EGFR   Labs pending Health Maintenance reviewed Diet and exercise encouraged  Follow up plan: 6 months    Evelina Dun, FNP

## 2021-05-13 DIAGNOSIS — B351 Tinea unguium: Secondary | ICD-10-CM | POA: Diagnosis not present

## 2021-05-13 DIAGNOSIS — L84 Corns and callosities: Secondary | ICD-10-CM | POA: Diagnosis not present

## 2021-05-13 DIAGNOSIS — M79676 Pain in unspecified toe(s): Secondary | ICD-10-CM | POA: Diagnosis not present

## 2021-05-19 DIAGNOSIS — B09 Unspecified viral infection characterized by skin and mucous membrane lesions: Secondary | ICD-10-CM | POA: Diagnosis not present

## 2021-08-29 ENCOUNTER — Ambulatory Visit: Payer: Medicare Other | Admitting: Family

## 2021-09-05 ENCOUNTER — Ambulatory Visit: Payer: Medicare Other | Admitting: Family

## 2021-09-11 ENCOUNTER — Ambulatory Visit (INDEPENDENT_AMBULATORY_CARE_PROVIDER_SITE_OTHER): Payer: Medicare Other | Admitting: Family

## 2021-09-11 ENCOUNTER — Encounter: Payer: Self-pay | Admitting: Family

## 2021-09-11 ENCOUNTER — Other Ambulatory Visit: Payer: Self-pay

## 2021-09-11 VITALS — BP 139/72 | HR 69 | Temp 96.7°F | Ht 60.0 in | Wt 134.0 lb

## 2021-09-11 DIAGNOSIS — E785 Hyperlipidemia, unspecified: Secondary | ICD-10-CM

## 2021-09-11 DIAGNOSIS — G3184 Mild cognitive impairment, so stated: Secondary | ICD-10-CM

## 2021-09-11 DIAGNOSIS — E559 Vitamin D deficiency, unspecified: Secondary | ICD-10-CM

## 2021-09-11 DIAGNOSIS — E663 Overweight: Secondary | ICD-10-CM

## 2021-09-11 DIAGNOSIS — E039 Hypothyroidism, unspecified: Secondary | ICD-10-CM

## 2021-09-11 DIAGNOSIS — J4540 Moderate persistent asthma, uncomplicated: Secondary | ICD-10-CM

## 2021-09-11 MED ORDER — LEVOTHYROXINE SODIUM 88 MCG PO TABS: 88.0000 ug | ORAL_TABLET | Freq: Every day | ORAL | 3 refills | Status: DC

## 2021-09-11 NOTE — Progress Notes (Signed)
Subjective:    Patient ID: [name], female    DOB: 12-Jun-1941, 80 y.o.   MRN: [id number]  Chief Complaint  Patient presents with   Medical Management of Chronic Issues   Pt presents to the office today for chronic follow up. Pt was followed by Pulmonologist's annually, but was told she only needed to come as needed . She is followed by the Neurologists 6 months for memory changes and neurocognitive disorder.   Asthma There is no cough or wheezing. The current episode started more than 1 year ago. The problem occurs intermittently. Her past medical history is significant for asthma.  Thyroid Problem Presents for follow-up visit. Patient reports no anxiety, diaphoresis, diarrhea, dry skin or fatigue. The symptoms have been stable. Her past medical history is significant for hyperlipidemia.  Hyperlipidemia This is a chronic problem. The current episode started more than 1 year ago. The problem is controlled. Current antihyperlipidemic treatment includes statins. The current treatment provides moderate improvement of lipids. Risk factors for coronary artery disease include dyslipidemia, a sedentary lifestyle and post-menopausal.     Review of Systems  Constitutional:  Negative for diaphoresis and fatigue.  Respiratory:  Negative for cough and wheezing.   Gastrointestinal:  Negative for diarrhea.  Psychiatric/Behavioral:  The patient is not nervous/anxious.   All other systems reviewed and are negative.     Objective:   Physical Exam Vitals reviewed.  Constitutional:      General: She is not in acute distress.    Appearance: She is well-developed.  HENT:     Head: Normocephalic and atraumatic.     Right Ear: Tympanic membrane normal.     Left Ear: Tympanic membrane normal.  Eyes:     Pupils: Pupils are equal, round, and reactive to light.  Neck:     Thyroid: No thyromegaly.  Cardiovascular:     Rate and Rhythm: Normal rate and regular rhythm.     Heart sounds: Normal  heart sounds. No murmur heard. Pulmonary:     Effort: Pulmonary effort is normal. No respiratory distress.     Breath sounds: Normal breath sounds. No wheezing.  Abdominal:     General: Bowel sounds are normal. There is no distension.     Palpations: Abdomen is soft.     Tenderness: There is no abdominal tenderness.  Musculoskeletal:        General: No tenderness. Normal range of motion.     Cervical back: Normal range of motion and neck supple.  Skin:    General: Skin is warm and dry.  Neurological:     Mental Status: She is alert and oriented to person, place, and time.     Cranial Nerves: No cranial nerve deficit.     Deep Tendon Reflexes: Reflexes are normal and symmetric.  Psychiatric:        Behavior: Behavior normal.        Thought Content: Thought content normal.        Judgment: Judgment normal.      BP 139/72   Pulse 69   Temp (!) 96.7 F (35.9 C) (Temporal)   Ht 5' (1.524 m)   Wt 134 lb (60.8 kg)   BMI 26.17 kg/m      Assessment & Plan:  [name] comes in today with chief complaint of Medical Management of Chronic Issues   Diagnosis and orders addressed:  1. Hypothyroidism, unspecified type - levothyroxine (EUTHYROX) 88 MCG tablet; Take 1 tablet (88 mcg total) by  mouth daily.  Dispense: 90 tablet; Refill: 3 - CMP14+EGFR - CBC with Differential/Platelet - TSH  2. Moderate persistent asthma in adult without complication - CHT98+VSYV - CBC with Differential/Platelet  3. Hyperlipidemia, unspecified hyperlipidemia type - CMP14+EGFR - CBC with Differential/Platelet - Lipid panel  4. Overweight (BMI 25.0-29.9) - CMP14+EGFR - CBC with Differential/Platelet  5. Vitamin D deficiency - CMP14+EGFR - CBC with Differential/Platelet - VITAMIN D 25 Hydroxy (Vit-D Deficiency, Fractures)  6. Mild neurocognitive disorder - CMP14+EGFR - CBC with Differential/Platelet   Labs pending Health Maintenance reviewed Diet and exercise encouraged  Follow  up plan: 6 months    Evelina Dun, FNP

## 2021-09-11 NOTE — Patient Instructions (Signed)

## 2021-09-12 LAB — CBC WITH DIFFERENTIAL/PLATELET
Basophils Absolute: 0 10*3/uL (ref 0.0–0.2)
Basos: 1 %
EOS (ABSOLUTE): 0.1 10*3/uL (ref 0.0–0.4)
Eos: 2 %
Hematocrit: 41 % (ref 34.0–46.6)
Hemoglobin: 13.9 g/dL (ref 11.1–15.9)
Immature Grans (Abs): 0 10*3/uL (ref 0.0–0.1)
Immature Granulocytes: 0 %
Lymphocytes Absolute: 1.2 10*3/uL (ref 0.7–3.1)
Lymphs: 20 %
MCH: 30.5 pg (ref 26.6–33.0)
MCHC: 33.9 g/dL (ref 31.5–35.7)
MCV: 90 fL (ref 79–97)
Monocytes Absolute: 0.5 10*3/uL (ref 0.1–0.9)
Monocytes: 8 %
Neutrophils Absolute: 4.1 10*3/uL (ref 1.4–7.0)
Neutrophils: 69 %
Platelets: 232 10*3/uL (ref 150–450)
RBC: 4.56 x10E6/uL (ref 3.77–5.28)
RDW: 12.7 % (ref 11.7–15.4)
WBC: 5.8 10*3/uL (ref 3.4–10.8)

## 2021-09-12 LAB — LIPID PANEL
Chol/HDL Ratio: 4.1 ratio (ref 0.0–4.4)
Cholesterol, Total: 195 mg/dL (ref 100–199)
HDL: 47 mg/dL (ref >39)
LDL Chol Calc (NIH): 128 mg/dL — ABNORMAL HIGH (ref 0–99)
Triglycerides: 113 mg/dL (ref 0–149)
VLDL Cholesterol Cal: 20 mg/dL (ref 5–40)

## 2021-09-12 LAB — CMP14+EGFR
ALT: 17 IU/L (ref 0–32)
AST: 18 IU/L (ref 0–40)
Albumin/Globulin Ratio: 2 (ref 1.2–2.2)
Albumin: 4.7 g/dL (ref 3.7–4.7)
Alkaline Phosphatase: 52 IU/L (ref 44–121)
BUN/Creatinine Ratio: 19 (ref 12–28)
BUN: 14 mg/dL (ref 8–27)
Bilirubin Total: 0.4 mg/dL (ref 0.0–1.2)
CO2: 21 mmol/L (ref 20–29)
Calcium: 9.8 mg/dL (ref 8.7–10.3)
Chloride: 106 mmol/L (ref 96–106)
Creatinine, Ser: 0.73 mg/dL (ref 0.57–1.00)
Globulin, Total: 2.3 g/dL (ref 1.5–4.5)
Glucose: 97 mg/dL (ref 70–99)
Potassium: 4.3 mmol/L (ref 3.5–5.2)
Sodium: 142 mmol/L (ref 134–144)
Total Protein: 7 g/dL (ref 6.0–8.5)
eGFR: 84 mL/min/{1.73_m2} (ref >59)

## 2021-09-12 LAB — VITAMIN D 25 HYDROXY (VIT D DEFICIENCY, FRACTURES): Vit D, 25-Hydroxy: 42 ng/mL (ref 30.0–100.0)

## 2021-09-12 LAB — TSH: TSH: 1.85 u[IU]/mL (ref 0.450–4.500)

## 2021-12-08 ENCOUNTER — Encounter (HOSPITAL_COMMUNITY): Payer: Self-pay | Admitting: *Deleted

## 2021-12-08 ENCOUNTER — Emergency Department (HOSPITAL_COMMUNITY)
Admission: EM | Admit: 2021-12-08 | Discharge: 2021-12-08 | Disposition: A | Discharge status: Home / Self Care | Payer: Medicare Other | Attending: Emergency Medicine | Admitting: Emergency Medicine

## 2021-12-08 ENCOUNTER — Emergency Department (HOSPITAL_COMMUNITY): Payer: Medicare Other

## 2021-12-08 ENCOUNTER — Ambulatory Visit: Payer: Medicare Other | Admitting: Family Medicine

## 2021-12-08 DIAGNOSIS — M5441 Lumbago with sciatica, right side: Secondary | ICD-10-CM | POA: Insufficient documentation

## 2021-12-08 DIAGNOSIS — M48061 Spinal stenosis, lumbar region without neurogenic claudication: Secondary | ICD-10-CM | POA: Diagnosis not present

## 2021-12-08 DIAGNOSIS — M545 Low back pain, unspecified: Secondary | ICD-10-CM | POA: Diagnosis present

## 2021-12-08 DIAGNOSIS — Z7982 Long term (current) use of aspirin: Secondary | ICD-10-CM | POA: Insufficient documentation

## 2021-12-08 MED ORDER — PREDNISONE 10 MG PO TABS: 20.0000 mg | ORAL_TABLET | Freq: Every day | ORAL | 0 refills | Status: DC

## 2021-12-08 MED ORDER — LIDOCAINE 5 % EX PTCH: 1.0000 | MEDICATED_PATCH | CUTANEOUS | 0 refills | Status: DC

## 2021-12-08 MED ORDER — PREDNISONE 50 MG PO TABS
60.0000 mg | ORAL_TABLET | Freq: Once | ORAL | Status: AC
  Administered 2021-12-08: 60 mg via ORAL
  Filled 2021-12-08: qty 1

## 2021-12-08 MED ORDER — LIDOCAINE 5 % EX PTCH
1.0000 | MEDICATED_PATCH | CUTANEOUS | Status: DC
  Administered 2021-12-08: 1 via TRANSDERMAL
  Filled 2021-12-08 (×2): qty 1

## 2021-12-08 NOTE — ED Triage Notes (Signed)
Right lower back pain for 2 weeks

## 2021-12-08 NOTE — ED Provider Notes (Signed)
[facility] EMERGENCY DEPARTMENT Provider Note   CSN: [id number] Arrival date & time: 12/08/21  1106     History  Chief Complaint  Patient presents with   Back Pain    [name] is a 81 y.o. female.   Back Pain  Patient is a very pleasant 81 year old female presenting with lower back pain.  It radiates on the right side of her buttock.  She feels it worse when she is walking, started 2 weeks ago atraumatically.  She has been taking ibuprofen at home which has helped alleviate the pain.  No history of diabetes, no previous spinal surgeries, no urinary retention, saddle anesthesia, bilateral lower extremity weakness.  Home Medications Prior to Admission medications   Medication Sig Start Date End Date Taking? Authorizing Provider  lidocaine (LIDODERM) 5 % Place 1 patch onto the skin daily. Remove & Discard patch within 12 hours or as directed by MD 12/08/21  Yes Theron Arista, PA-C  predniSONE (DELTASONE) 10 MG tablet Take 2 tablets (20 mg total) by mouth daily. 12/08/21  Yes Theron Arista, PA-C  acetaminophen (TYLENOL) 500 MG tablet Take 500-1,000 mg by mouth as needed.    [provider]  albuterol (PROAIR HFA) 108 (90 Base) MCG/ACT inhaler Inhale 2 puffs into the lungs every 6 (six) hours as needed for wheezing or shortness of breath. 05/02/19   Chilton Greathouse, MD  aspirin 81 MG chewable tablet Chew by mouth daily.    [provider]  ibuprofen (ADVIL,MOTRIN) 200 MG tablet Take 400 mg by mouth every 6 (six) hours as needed for mild pain.    [provider]  levothyroxine (EUTHYROX) 88 MCG tablet Take 1 tablet (88 mcg total) by mouth daily. 09/11/21   Junie Spencer, FNP  Multiple Vitamins-Minerals (MULTIVITAL-M PO) Take by mouth. Vitafusion womens    [provider]      Allergies    Sulfonamide derivatives    Review of Systems   Review of Systems  Musculoskeletal:  Positive for back pain.   Physical Exam Updated Vital Signs BP (!)  149/74    Pulse 64    Temp 97.7 F (36.5 C) (Oral)    Resp 18    SpO2 99%  Physical Exam Vitals and nursing note reviewed. Exam conducted with a chaperone present.  Constitutional:      Appearance: Normal appearance.  HENT:     Head: Normocephalic.  Eyes:     Extraocular Movements: Extraocular movements intact.     Pupils: Pupils are equal, round, and reactive to light.     Comments: No nystagmus   Neck:     Comments: No midline cervical tenderness. No palpable deformities.  Cardiovascular:     Rate and Rhythm: Normal rate and regular rhythm.     Pulses: Normal pulses.     Comments: DP, PT, and radial pulses 2+ and symmetrical bilaterally Pulmonary:     Effort: Pulmonary effort is normal.     Breath sounds: Normal breath sounds.  Abdominal:     Tenderness: There is no right CVA tenderness or left CVA tenderness.  Musculoskeletal:        General: Tenderness present.     Cervical back: Normal range of motion. No rigidity or tenderness.     Comments: Pain reproduced with straight leg raise of the right lower extremity.  No specific midline tenderness.  Skin:    General: Skin is warm and dry.     Capillary Refill: Capillary refill takes less than  2 seconds.     Findings: No bruising or erythema.  Neurological:     Mental Status: She is alert and oriented to person, place, and time. Mental status is at baseline.     Comments: Patient is alert, oriented to personal, place and time with normal speech. Cranial nerves III-XII grossly in tact. Grip strength equal bilaterally LE strength equal bilaterally. Sensation to light touch in tact bilaterally. No gait abnormalities, patient ambulatory.    Psychiatric:        Mood and Affect: Mood normal.    ED Results / Procedures / Treatments   Labs (all labs ordered are listed, but only abnormal results are displayed) Labs Reviewed - No data to display  EKG None  Radiology CT Lumbar Spine Wo Contrast  Result Date: 12/08/2021 CLINICAL  DATA:  Low back pain, cancer suspected Compression fracture, lumbar EXAM: CT LUMBAR SPINE WITHOUT CONTRAST TECHNIQUE: Multidetector CT imaging of the lumbar spine was performed without intravenous contrast administration. Multiplanar CT image reconstructions were also generated. RADIATION DOSE REDUCTION: This exam was performed according to the departmental dose-optimization program which includes automated exposure control, adjustment of the mA and/or kV according to patient size and/or use of iterative reconstruction technique. COMPARISON:  CT 03/09/2018 FINDINGS: Segmentation: 5 lumbar type vertebrae. Alignment: Dextroconvex lumbar curvature. Trace retrolisthesis at L2-L3 and L3-L4 and L4-L5. Vertebrae: There is no evidence of lumbar spine fracture. No aggressive osseous lesion. Unchanged posterior wedging of L5. Multilevel degenerative endplate changes. Paraspinal and other soft tissues: There is a 2.2 cm left posterior renal cyst, density consistent with a simple cyst. Aortoiliac atherosclerotic calcifications. No AAA. Disc levels: There is multilevel degenerative disc disease and bilateral facet arthropathy resulting in varying degrees of mild to moderate spinal canal stenosis and bilateral neural foraminal narrowing. Degree of spinal canal stenosis appears the worst at L2-L3, L3-L4, and L4-L5, where it is likely moderate. There is neural foraminal narrowing is worse on the left at L4-L5 where it appears at least moderate. IMPRESSION: No evidence of acute lumbar spine fracture. 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 At least moderate spinal canal stenosis at L2-L3, L3-L4, and L4-L5. At least moderate left-sided neural foraminal stenosis at L4-L5. Electronically Signed   By: Caprice Renshaw M.D.   On:  12/08/2021 12:46    Procedures Procedures    Medications Ordered in ED Medications  predniSONE (DELTASONE) tablet 60 mg (60 mg Oral Given 12/08/21 1331)    ED Course/ Medical Decision Making/ A&P                           Medical Decision Making Amount and/or Complexity of Data Reviewed Radiology: ordered.  Risk Prescription drug management.   This patient presents to the ED for concern of low back pain, this involves an extensive number of treatment options, and is a complaint that carries with it a high risk of complications and morbidity.  The differential diagnosis includes sciatica, cauda equina, epidural abscess, occult fracture, other   Co morbidities that complicate the patient evaluation: osteoporosis    Additional history obtained: -Additional history obtained from patient's sister at bedside -External records from outside source obtained and reviewed including: Chart review including previous notes, labs, imaging, consultation notes   Imaging Studies ordered: -I ordered imaging studies including CT lumbar -I  independently visualized and interpreted imaging which showed spinal narrowing likely attributing to her nerve impingement.  No obvious fractures or masses. -I agree with the radiologist interpretation   Medicines ordered and prescription drug management: -I ordered medication including Lidoderm and prednisone  for back pain  -Reevaluation of the patient after these medicines showed that the patient improved -I have reviewed the patients home medicines and have made adjustments as needed   ED Course: Patient is mildly hypertensive, otherwise vitals unremarkable.  No focal deficits on neuro exam, patient is ambulatory with a steady gait.  Given her age and symptoms going on greater than 2 weeks I did proceed with CT to better evaluate, it is notable for stenosis likely contributing to radiculopathy and sciatic symptoms.  Sciatica was most consistent with my  exam, considered but think epidural abscess unlikely.  Also no evidence of fracture.  No cauda equina symptoms.  Do not feel she needs any additional work-up in the ED setting, will discharge with short steroid burst and have her follow-up with orthopedics if symptoms persist.   Reevaluation: After the interventions noted above, I reevaluated the patient and found that they have :improved   Dispostion: D/C, ortho follow up.          Final Clinical Impression(s) / ED Diagnoses Final diagnoses:  Acute right-sided low back pain with right-sided sciatica    Rx / DC Orders ED Discharge Orders          Ordered    predniSONE (DELTASONE) 10 MG tablet  Daily        12/08/21 1314    lidocaine (LIDODERM) 5 %  Every 24 hours        12/08/21 1314              Theron Arista, PA-C 12/08/21 1913    Gloris Manchester, MD 12/12/21 1312

## 2021-12-08 NOTE — Discharge Instructions (Addendum)
]  Take prednisone 40 mg for the next 5 days.  Follow-up with Dr. Romeo Apple if the pain persist.  Use the Lidoderm patches as needed for pain.  Your CT scan showed the following:  No evidence of acute lumbar spine fracture.     Dextroconvex curvature with trace degenerative anterolisthesis at  L2-L3, L3-L4, and L4-L5.     Multilevel degenerative disc disease and bilateral facet arthropathy  resulting in varying degrees of mild to moderate spinal canal and  neural foraminal stenosis. At least moderate spinal canal stenosis  at L2-L3, L3-L4, and L4-L5. At least moderate left-sided neural  foraminal stenosis at L4-L5.

## 2021-12-09 ENCOUNTER — Ambulatory Visit: Payer: Medicare Other | Admitting: Nurse Practitioner

## 2022-01-27 ENCOUNTER — Encounter: Payer: Self-pay | Admitting: Neurology

## 2022-01-27 ENCOUNTER — Telehealth: Payer: Self-pay | Admitting: Neurology

## 2022-01-27 ENCOUNTER — Ambulatory Visit: Payer: Medicare Other | Admitting: Neurology

## 2022-01-27 VITALS — BP 150/79 | HR 72 | Ht 62.0 in | Wt 134.6 lb

## 2022-01-27 DIAGNOSIS — G3184 Mild cognitive impairment, so stated: Secondary | ICD-10-CM

## 2022-01-27 DIAGNOSIS — F0390 Unspecified dementia without behavioral disturbance: Secondary | ICD-10-CM | POA: Insufficient documentation

## 2022-01-27 DIAGNOSIS — G300 Alzheimer's disease with early onset: Secondary | ICD-10-CM | POA: Insufficient documentation

## 2022-01-27 NOTE — Progress Notes (Signed)
?[facility] ? ? ? ?Provider:  Dr Jaynee Eagles ?Requesting Provider: Sharion Balloon, FNP ?Primary Care Provider:  Sharion Balloon, FNP ? 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 ?01/27/2022: She was diagnosed with MCI for formal neurocognitive testing. She is worse. I suggest repeat so we can see progression, whether this is Alzeimers or normal cognitive aging. Son is here. She could not tolerate Aricept or namenda. Discussed acute events, usually not dementia but usually due to metabolic/toxic or other physical injuries so we try to avoid. And if there is an acute change see primary care first. Will refer back to [facility]. Son provides much information,  says more confusion when out of the house, out of her comfort zone. No agitation or behavioral, or hallucinations or delusions. No depression. No falls. Eating well. She is still driving, no accidents, no getting lost. No other focal neurologic deficits, associated symptoms, inciting events or modifiable factors. ? ? ?01/27/2021: Has her good days and bad days. Still living in the same place. Appetite is good but she is not eating as much. Weight is stable. She orders out a lot, doesn't cook as much. No swllowing problems, no falls or accidents, she drives locally and no problems, she had formal memory testing Dr. Melvyn Novas in 08/2019 and suggested repeating. Son is here and sees some difference, not significantly different. This morning she didn't remember a conversation. Her younger son watches her and she does not miss a bill. No ireegularities with bank accounts, No balance problems. No exercise, discussed leading a more healthy lifestyle.  ? ?Patient complains of symptoms per HPI as well as the following symptoms: memory decline . Pertinent negatives and positives per HPI. All others negative ? ?01/27/2021: Has her good days and bad days. Still living in the same place. Appetite is good but she is not eating as much.  Weight is stable. She orders out a lot, doesn't cook as much. No swllowing problems, no falls or accidents, she drives locally and no problems, she had formal memory testing Dr. Melvyn Novas in 08/2019 and suggested repeating. Son is here and sees some difference, not significantly different. This morning she didn't remember a conversation. Her younger son watches her and she does not miss a bill. No ireegularities with bank accounts, No balance problems. No exercise, discussed leading a more healthy lifestyle.  ? ?Patient complains of symptoms per HPI as well as the following symptoms: memory decline . Pertinent negatives and positives per HPI. All others negative ?.  ? ?04/10/2020: She is doing well. She has decreased heart rate, decreased since we started Donepezil, asymptomatic, we discussed the new biogen medication and discussed pathophysiology, she hs pulse of 55 and started her on donepezil asymptomatic however given brdycardia will order a 2-week holter monitor patch and if her heart rate is actually better that what we see today than we can try to increase the donepezil. She is here with her son who also provides much information. No falls. Memory stable. Maybe her memory is just a tad bit worse, when she is out of the house it is worse, out of her comfort zone, may be mild anxiety. Her repeat formal memory testing was not significantly progressed will repeat again in 12-18 months.  ? ?Patient complains of symptoms per HPI as well as the following symptoms: memory loss, anxiety . Pertinent negatives and positives per HPI. All others negative ? ? ?Interval history November 29, 2019: I saw patient for follow-up with  her son, both very lovely, MRI of the brain was normal for age, formal memory testing was consisted with mild neurocognitive disorder not dementia, however cannot rule out a prodromal Alzheimer's and we will have to really watch her for progression.  She had vomiting with the Namzaric which is a  combination of donepezil and memantine, at this time we will just start her on donepezil very low-dose 5 mg they can even start with half a tab just to make sure she does not have any side effects.  Then we can increase it when we see her in 3 to 4 months in the office or via video.  They can always reach me by email or phone calls if anything happens. ? ?HPI:  [name] is a 81 y.o. female here as requested by Sharion Balloon, FNP for memory problems.  Past medical history asthma, hyperlipidemia, overweight, hypothyroidism, vitamin D deficiency, memory problem. Here with her son who also provides information. She says she forgets things, she tries to write things done and she loses the paper and finds whatever she is looking for, started at least a year ago per son. She has a form of ADD and son thought she was forgetful due to this but noticed it more a year ago. Son says they spent the day together and they went to [employer], she paid him for groceries and then forgot and says again she needed to pay him in the same day. She forgets appointments, has to tell patient multiple times in the same day about things, more short-term memory, she lives with her husband and he is in ICU and the last 2 weeks it has been worse since she is going to the hospital and there is stress. Short-term memory progressive. Her energy is poor. Anxiety is worsening but memory changes started prior to this increased anxiety. She is paying all the bills now, husband helps her and she writes it down, not missing any bills and doing well, son says she has double paid even up to 2 years ago bc she forget she already paid.She is able to take medications, not missing any medications, no difficulties driving, she says she was lost once. But she has ADD and her brain "wanders off". No other focal neurologic deficits, associated symptoms, inciting events or modifiable factors. Sister had Alzheimer's, died at 59 and had it 71  years. ? ? ?Reviewed notes, labs and imaging from outside physicians, which showed:  ? ?I reviewed Serita Sheller' notes.  Patient very anxious about her memory.  Patient requested a neurology referral.  She was encouraged to write notes.  She was started on memantine and donepezil (Namzaric) titration up to 28 mg memantine with 10 mg donepezil.  Mini-Mental status exam was 28 out of 30.  This was July 27, 2019.  Labs taken the same day include normal TSH, normal CBC, normal CMP, excellent vitamin D levels. ? ?Review of Systems: ?Patient complains of symptoms per HPI as well as the following symptoms: memory loss. Pertinent negatives and positives per HPI. All others negative. ? ? ?Social History  ? ?Socioeconomic History  ? Marital status: Widowed  ?  Spouse name: Not on file  ? Number of children: 3  ? Years of education: 65  ? Highest education level: Not on file  ?Occupational History  ? Occupation: retired Engineer, manufacturing systems  ?Tobacco Use  ? Smoking status: Never  ? Smokeless tobacco: Never  ?Vaping Use  ? Vaping Use: Never  used  ?Substance and Sexual Activity  ? Alcohol use: No  ? Drug use: No  ? Sexual activity: Not on file  ?Other Topics Concern  ? Not on file  ?Social History Narrative  ? Lives at home alone   ? Right handed  ? Caffeine: 3 cups/day coffee, coke   ? ?Social Determinants of Health  ? ?Financial Resource Strain: Not on file  ?Food Insecurity: Not on file  ?Transportation Needs: Not on file  ?Physical Activity: Not on file  ?Stress: Not on file  ?Social Connections: Not on file  ?Intimate Partner Violence: Not on file  ? ? ?Family History  ?Problem Relation Age of Onset  ? Heart failure Mother   ? Emphysema Mother   ? Other Father   ?     blood clot in brain  ? Breast cancer Sister   ? Cancer Sister   ?     breast  ? Memory loss Sister   ? Breast cancer Sister   ? Cancer Sister   ?     breast  ? Asthma Sister   ? Cancer Brother   ?     lung  ? Memory loss Sister   ? Anesthesia problems Neg Hx   ?  Hypotension Neg Hx   ? Malignant hyperthermia Neg Hx   ? Pseudochol deficiency Neg Hx   ? ? ?Past Medical History:  ?Diagnosis Date  ? Allergic rhinitis 01/08/2010  ? Qualifier: Diagnosis of  By: Joya Gaskins MD, Burnett Harry   ? Asthma

## 2022-01-27 NOTE — Telephone Encounter (Signed)
Referral sent to Dr. Milbert Coulter [phone]. ?

## 2022-01-27 NOTE — Patient Instructions (Addendum)
Repeat formal memory testing ?If concern for Alzheimers, will retry Aricept at much lower dose and consider an FDG PET Scan for dementia ? ?Mild Neurocognitive Disorder ?Mild neurocognitive disorder, formerly known as mild cognitive impairment, is a disorder in which memory does not work as well as it should. This disorder may also cause problems with other mental functions, including thought, communication, behavior, and completion of tasks. These problems can be noticed and measured, but they usually do not interfere with daily activities or the ability to live independently. ?Mild neurocognitive disorder typically develops after 80 years of age, but it can also develop at younger ages. It is not as serious as major neurocognitive disorder, also known as dementia, but it may be the first sign of it. Generally, symptoms of this condition get worse over time. In rare cases, symptoms can get better. ?What are the causes? ?This condition may be caused by: ?Brain disorders like Alzheimer's disease, Parkinson's disease, and other conditions that gradually damage nerve cells (neurodegenerative conditions). ?Diseases that affect blood vessels in the brain and result in small strokes. ?Certain infections, such as HIV. ?Traumatic brain injury. ?Other medical conditions, such as brain tumors, underactive thyroid (hypothyroidism), and vitamin B12 deficiency. ?Use of certain drugs or prescription medicines. ?What increases the risk? ?The following factors may make you more likely to develop this condition: ?Being older than 65 years. ?Being female. ?Low education level. ?Diabetes, high blood pressure, high cholesterol, and other conditions that increase the risk for blood vessel diseases. ?Untreated or undertreated sleep apnea. ?Having a certain type of gene that can be passed from parent to child (inherited). ?Chronic health problems such as heart disease, lung disease, liver disease, kidney disease, or depression. ?What are  the signs or symptoms? ?Symptoms of this condition include: ?Difficulty remembering. You may: ?Forget names, phone numbers, or details of recent events. ?Forget social events and appointments. ?Repeatedly forget where you put your car keys or other items. ?Difficulty thinking and solving problems. You may have trouble with complex tasks, such as: ?Paying bills. ?Driving in unfamiliar places. ?Difficulty communicating. You may have trouble: ?Finding the right word or naming an object. ?Forming a sentence that makes sense, or understanding what you read or hear. ?Changes in your behavior or personality. When this happens, you may: ?Lose interest in the things that you used to enjoy. ?Withdraw from social situations. ?Get angry more easily than usual. ?Act before thinking. ?How is this diagnosed? ?This condition is diagnosed based on: ?Your symptoms. Your health care provider may ask you and the people you spend time with, such as family and friends, about your symptoms. ?Evaluation of mental functions (neuropsychological testing). Your health care provider may refer you to a neurologist or mental health specialist to evaluate your mental functions in detail. ?To identify the cause of your condition, your health care provider may: ?Get a detailed medical history. ?Ask about use of alcohol, drugs, and prescription medicines. ?Do a physical exam. ?Order blood tests and brain imaging exams. ?How is this treated? ?Mild neurocognitive disorder that is caused by medicine use, drug use, infection, or another medical condition may improve when the cause is treated, or when medicines or drugs are stopped. If this disorder has another cause, it generally does not improve and may get worse. In these cases, the goal of treatment is to help you manage the loss of mental function. Treatments in these cases include: ?Medicine. Medicine mainly helps memory and behavior symptoms. ?Talk therapy. Talk therapy provides education,  emotional  support, memory aids, and other ways of making up for problems with mental function. ?Lifestyle changes, including: ?Getting regular exercise. ?Eating a healthy diet that includes omega-3 fatty acids. ?Challenging your thinking and memory skills. ?Having more social interaction. ?Follow these instructions at home: ?Eating and drinking ? ?Drink enough fluid to keep your urine pale yellow. ?Eat a healthy diet that includes omega-3 fatty acids. These can be found in: ?Fish. ?Nuts. ?Leafy vegetables. ?Vegetable oils. ?If you drink alcohol: ?Limit how much you use to: ?0-1 drink a day for women. ?0-2 drinks a day for men. ?Be aware of how much alcohol is in your drink. In the [place] one drink equals one 12 oz bottle of beer (355 mL), one 5 oz glass of wine (148 mL), or one 1? oz glass of hard liquor (44 mL). ?Lifestyle ? ?Get regular exercise as told by your health care provider. ?Do not use any products that contain nicotine or tobacco, such as cigarettes, e-cigarettes, and chewing tobacco. If you need help quitting, ask your health care provider. ?Practice ways to manage stress. If you need help managing stress, ask your health care provider. ?Continue to have social interaction. ?Keep your mind active with stimulating activities you enjoy, such as reading or playing games. ?Make sure to get quality sleep. Follow these tips: ?Avoid napping during the day. ?Keep your sleeping area dark and cool. ?Avoid exercising during the few hours before you go to bed. ?Avoid caffeine products in the evening. ?General instructions ?Take over-the-counter and prescription medicines only as told by your health care provider. Your health care provider may recommend that you avoid taking medicines that can affect thinking, such as pain medicines or sleep medicines. ?Work with your health care provider to find out what you need help with and what your safety needs are. ?Keep all follow-up visits. This is important. ?Where to find  more information ?[employer] on Aging: https://walker.com/ ?Contact a health care provider if: ?You have any new symptoms. ?Get help right away if: ?You develop new confusion or your confusion gets worse. ?You act in ways that place you or your family in danger. ?Summary ?Mild neurocognitive disorder is a disorder in which memory does not work as well as it should. ?Mild neurocognitive disorder can have many causes. It may be the first stage of dementia. ?To manage your condition, get regular exercise, keep your mind active, get quality sleep, and eat a healthy diet. ?This information is not intended to replace advice given to you by your health care provider. Make sure you discuss any questions you have with your health care provider. ?Document Revised: 02/26/2020 Document Reviewed: 02/26/2020 ?Elsevier Patient Education ? 2022 Elsevier Inc. ? ?

## 2022-01-28 ENCOUNTER — Encounter: Payer: Self-pay | Admitting: Psychology

## 2022-02-02 ENCOUNTER — Ambulatory Visit: Payer: Medicare Other | Admitting: Neurology

## 2022-02-24 DIAGNOSIS — Z1231 Encounter for screening mammogram for malignant neoplasm of breast: Secondary | ICD-10-CM | POA: Diagnosis not present

## 2022-03-04 DIAGNOSIS — R928 Other abnormal and inconclusive findings on diagnostic imaging of breast: Secondary | ICD-10-CM | POA: Diagnosis not present

## 2022-03-10 ENCOUNTER — Ambulatory Visit: Payer: Medicare Other | Admitting: Family

## 2022-03-25 ENCOUNTER — Encounter: Payer: Self-pay | Admitting: Neurology

## 2022-03-27 ENCOUNTER — Encounter: Payer: Self-pay | Admitting: Family

## 2022-03-27 ENCOUNTER — Ambulatory Visit (INDEPENDENT_AMBULATORY_CARE_PROVIDER_SITE_OTHER): Payer: Medicare Other | Admitting: Family

## 2022-03-27 VITALS — BP 126/79 | HR 68 | Temp 98.0°F | Ht 62.0 in | Wt 133.0 lb

## 2022-03-27 DIAGNOSIS — G3184 Mild cognitive impairment, so stated: Secondary | ICD-10-CM

## 2022-03-27 DIAGNOSIS — E039 Hypothyroidism, unspecified: Secondary | ICD-10-CM | POA: Diagnosis not present

## 2022-03-27 DIAGNOSIS — E785 Hyperlipidemia, unspecified: Secondary | ICD-10-CM

## 2022-03-27 DIAGNOSIS — E559 Vitamin D deficiency, unspecified: Secondary | ICD-10-CM | POA: Diagnosis not present

## 2022-03-27 DIAGNOSIS — F411 Generalized anxiety disorder: Secondary | ICD-10-CM | POA: Insufficient documentation

## 2022-03-27 DIAGNOSIS — J4540 Moderate persistent asthma, uncomplicated: Secondary | ICD-10-CM

## 2022-03-27 LAB — URINALYSIS, COMPLETE
Bilirubin, UA: NEGATIVE
Glucose, UA: NEGATIVE
Ketones, UA: NEGATIVE
Nitrite, UA: NEGATIVE
Protein,UA: NEGATIVE
RBC, UA: NEGATIVE
Specific Gravity, UA: <1.005 — ABNORMAL LOW (ref 1.005–1.030)
Urobilinogen, Ur: 0.2 mg/dL (ref 0.2–1.0)
pH, UA: 6.5 (ref 5.0–7.5)

## 2022-03-27 LAB — MICROSCOPIC EXAMINATION
RBC, Urine: NONE SEEN /hpf (ref 0–2)
Renal Epithel, UA: NONE SEEN /hpf

## 2022-03-27 MED ORDER — ESCITALOPRAM OXALATE 5 MG PO TABS: 5.0000 mg | ORAL_TABLET | Freq: Every day | ORAL | 1 refills | Status: DC

## 2022-03-27 NOTE — Patient Instructions (Signed)

## 2022-03-27 NOTE — Progress Notes (Signed)
Subjective:    Patient ID: [name], female    DOB: 1941/02/12, 81 y.o.   MRN: [id number]  Chief Complaint  Patient presents with   Medical Management of Chronic Issues   Pt presents to the office today for chronic follow up. Pt was followed by Pulmonologist's annually, but was told she only needed to come as needed . She is followed by the Neurologists 6 months for memory changes and neurocognitive disorder.    She has moved and one of her sisters passed.  Asthma There is no cough or wheezing. This is a chronic problem. The current episode started more than 1 year ago. The problem has been waxing and waning. She reports moderate improvement on treatment. Her past medical history is significant for asthma.  Thyroid Problem Presents for follow-up visit. Symptoms include anxiety. Patient reports no diarrhea, dry skin or fatigue. The symptoms have been stable. Her past medical history is significant for hyperlipidemia.  Hyperlipidemia This is a chronic problem. The current episode started more than 1 year ago. The problem is uncontrolled. Exacerbating diseases include obesity. Current antihyperlipidemic treatment includes statins. The current treatment provides moderate improvement of lipids. Risk factors for coronary artery disease include dyslipidemia and a sedentary lifestyle.  Anxiety Presents for follow-up visit. Symptoms include excessive worry, irritability and nervous/anxious behavior. Symptoms occur occasionally. The severity of symptoms is mild.   Her past medical history is significant for asthma.     Review of Systems  Constitutional:  Positive for irritability. Negative for fatigue.  Respiratory:  Negative for cough and wheezing.   Gastrointestinal:  Negative for diarrhea.  Psychiatric/Behavioral:  The patient is nervous/anxious.   All other systems reviewed and are negative.     Objective:   Physical Exam Vitals reviewed.  Constitutional:      General: She is  not in acute distress.    Appearance: She is well-developed.  HENT:     Head: Normocephalic and atraumatic.     Right Ear: Tympanic membrane normal.     Left Ear: Tympanic membrane normal.  Eyes:     Pupils: Pupils are equal, round, and reactive to light.  Neck:     Thyroid: No thyromegaly.  Cardiovascular:     Rate and Rhythm: Normal rate and regular rhythm.     Heart sounds: Normal heart sounds. No murmur heard. Pulmonary:     Effort: Pulmonary effort is normal. No respiratory distress.     Breath sounds: Normal breath sounds. No wheezing.  Abdominal:     General: Bowel sounds are normal. There is no distension.     Palpations: Abdomen is soft.     Tenderness: There is no abdominal tenderness.  Musculoskeletal:        General: No tenderness. Normal range of motion.     Cervical back: Normal range of motion and neck supple.  Skin:    General: Skin is warm and dry.  Neurological:     Mental Status: She is alert and oriented to person, place, and time.     Cranial Nerves: No cranial nerve deficit.     Deep Tendon Reflexes: Reflexes are normal and symmetric.  Psychiatric:        Behavior: Behavior normal.        Thought Content: Thought content normal.        Judgment: Judgment normal.      BP 126/79   Pulse 68   Temp 98 F (36.7 C)   Ht '5\' 2"'  (1.575  m)   Wt 133 lb (60.3 kg)   SpO2 99%   BMI 24.33 kg/m      Assessment & Plan:  [name] comes in today with chief complaint of Medical Management of Chronic Issues   Diagnosis and orders addressed:  1. Moderate persistent asthma in adult without complication - UYW90+PPND - CBC with Differential/Platelet  2. Hypothyroidism, unspecified type - CMP14+EGFR - CBC with Differential/Platelet  3. Hyperlipidemia, unspecified hyperlipidemia type - CMP14+EGFR - CBC with Differential/Platelet  4. Mild neurocognitive disorder - CMP14+EGFR - CBC with Differential/Platelet - Urinalysis, Complete  5. Vitamin D  deficiency - CMP14+EGFR - CBC with Differential/Platelet  6. MCI (mild cognitive impairment) - CMP14+EGFR - CBC with Differential/Platelet  7. GAD (generalized anxiety disorder) Will add Lexapro 5 mg  Stress management  Follow up in 1 month  - CMP14+EGFR - CBC with Differential/Platelet - escitalopram (LEXAPRO) 5 MG tablet; Take 1 tablet (5 mg total) by mouth daily.  Dispense: 90 tablet; Refill: 1   Labs pending Health Maintenance reviewed Diet and exercise encouraged  Follow up plan: 1 month   Evelina Dun, FNP

## 2022-03-28 LAB — CBC WITH DIFFERENTIAL/PLATELET
Basophils Absolute: 0.1 10*3/uL (ref 0.0–0.2)
Basos: 1 %
EOS (ABSOLUTE): 0.2 10*3/uL (ref 0.0–0.4)
Eos: 3 %
Hematocrit: 42.2 % (ref 34.0–46.6)
Hemoglobin: 14.1 g/dL (ref 11.1–15.9)
Immature Grans (Abs): 0 10*3/uL (ref 0.0–0.1)
Immature Granulocytes: 0 %
Lymphocytes Absolute: 1.5 10*3/uL (ref 0.7–3.1)
Lymphs: 24 %
MCH: 30.7 pg (ref 26.6–33.0)
MCHC: 33.4 g/dL (ref 31.5–35.7)
MCV: 92 fL (ref 79–97)
Monocytes Absolute: 0.5 10*3/uL (ref 0.1–0.9)
Monocytes: 8 %
Neutrophils Absolute: 4.1 10*3/uL (ref 1.4–7.0)
Neutrophils: 64 %
Platelets: 241 10*3/uL (ref 150–450)
RBC: 4.59 x10E6/uL (ref 3.77–5.28)
RDW: 13 % (ref 11.7–15.4)
WBC: 6.3 10*3/uL (ref 3.4–10.8)

## 2022-03-28 LAB — CMP14+EGFR
ALT: 15 IU/L (ref 0–32)
AST: 18 IU/L (ref 0–40)
Albumin/Globulin Ratio: 2 (ref 1.2–2.2)
Albumin: 4.7 g/dL (ref 3.7–4.7)
Alkaline Phosphatase: 58 IU/L (ref 44–121)
BUN/Creatinine Ratio: 17 (ref 12–28)
BUN: 13 mg/dL (ref 8–27)
Bilirubin Total: 0.5 mg/dL (ref 0.0–1.2)
CO2: 23 mmol/L (ref 20–29)
Calcium: 9.8 mg/dL (ref 8.7–10.3)
Chloride: 104 mmol/L (ref 96–106)
Creatinine, Ser: 0.76 mg/dL (ref 0.57–1.00)
Globulin, Total: 2.4 g/dL (ref 1.5–4.5)
Glucose: 96 mg/dL (ref 70–99)
Potassium: 4.1 mmol/L (ref 3.5–5.2)
Sodium: 140 mmol/L (ref 134–144)
Total Protein: 7.1 g/dL (ref 6.0–8.5)
eGFR: 79 mL/min/{1.73_m2} (ref >59)

## 2022-05-11 ENCOUNTER — Ambulatory Visit: Payer: Medicare Other | Admitting: Family

## 2022-05-26 ENCOUNTER — Encounter: Payer: Self-pay | Admitting: Family

## 2022-05-26 ENCOUNTER — Ambulatory Visit (INDEPENDENT_AMBULATORY_CARE_PROVIDER_SITE_OTHER): Payer: Medicare Other | Admitting: Family

## 2022-05-26 VITALS — BP 131/82 | HR 83 | Temp 97.8°F | Ht 62.0 in | Wt 136.8 lb

## 2022-05-26 DIAGNOSIS — G3184 Mild cognitive impairment, so stated: Secondary | ICD-10-CM | POA: Diagnosis not present

## 2022-05-26 DIAGNOSIS — J4540 Moderate persistent asthma, uncomplicated: Secondary | ICD-10-CM

## 2022-05-26 DIAGNOSIS — F411 Generalized anxiety disorder: Secondary | ICD-10-CM

## 2022-05-26 MED ORDER — ESCITALOPRAM OXALATE 10 MG PO TABS: 10.0000 mg | ORAL_TABLET | Freq: Every day | ORAL | 3 refills | Status: DC

## 2022-05-26 NOTE — Patient Instructions (Signed)

## 2022-05-26 NOTE — Progress Notes (Signed)
Subjective:    Patient ID: [name], female    DOB: [birth year], 81 y.o.   MRN: [id number]  Chief Complaint  Patient presents with   Follow-up    Discuss increase lexapro    Pt presents to the office to follow up on GAD and Depression. She was started on Lexapro 5 mg, but haven't been able to see a big difference. Continues to worry.   She has mild dementia. She is living by herself.  Anxiety Presents for follow-up visit. Symptoms include depressed mood, excessive worry, irritability, nervous/anxious behavior and restlessness. Patient reports no chest pain, palpitations or panic. Symptoms occur most days. The severity of symptoms is moderate.    Depression        This is a chronic problem.  The current episode started more than 1 year ago.   The problem occurs intermittently.  Associated symptoms include restlessness, decreased interest and sad.  Associated symptoms include no helplessness and no hopelessness.     The symptoms are aggravated by family issues.  Past treatments include SSRIs - Selective serotonin reuptake inhibitors.  Past medical history includes anxiety.       Review of Systems  Constitutional:  Positive for irritability.  Cardiovascular:  Negative for chest pain and palpitations.  Psychiatric/Behavioral:  Positive for depression. The patient is nervous/anxious.   All other systems reviewed and are negative.      Objective:   Physical Exam Vitals reviewed.  Constitutional:      General: She is not in acute distress.    Appearance: She is well-developed.  HENT:     Head: Normocephalic and atraumatic.     Right Ear: Tympanic membrane normal.     Left Ear: Tympanic membrane normal.  Eyes:     Pupils: Pupils are equal, round, and reactive to light.  Neck:     Thyroid: No thyromegaly.  Cardiovascular:     Rate and Rhythm: Normal rate and regular rhythm.     Heart sounds: Normal heart sounds. No murmur heard. Pulmonary:     Effort: Pulmonary effort  is normal. No respiratory distress.     Breath sounds: Normal breath sounds. No wheezing.  Abdominal:     General: Bowel sounds are normal. There is no distension.     Palpations: Abdomen is soft.     Tenderness: There is no abdominal tenderness.  Musculoskeletal:        General: No tenderness. Normal range of motion.     Cervical back: Normal range of motion and neck supple.  Skin:    General: Skin is warm and dry.  Neurological:     Mental Status: She is alert and oriented to person, place, and time.     Cranial Nerves: No cranial nerve deficit.     Deep Tendon Reflexes: Reflexes are normal and symmetric.  Psychiatric:        Behavior: Behavior normal.        Thought Content: Thought content normal.        Judgment: Judgment normal.       BP 131/82   Pulse 83   Temp 97.8 F (36.6 C) (Temporal)   Ht 5\' 2"  (1.575 m)   Wt 136 lb 12.8 oz (62.1 kg)   SpO2 96%   BMI 25.02 kg/m      Assessment & Plan:  [name] comes in today with chief complaint of Follow-up (Discuss increase lexapro )   Diagnosis and orders addressed:  1. Moderate persistent  asthma in adult without complication - escitalopram (LEXAPRO) 10 MG tablet; Take 1 tablet (10 mg total) by mouth daily.  Dispense: 90 tablet; Refill: 3  2. GAD (generalized anxiety disorder) - escitalopram (LEXAPRO) 10 MG tablet; Take 1 tablet (10 mg total) by mouth daily.  Dispense: 90 tablet; Refill: 3  3. Mild neurocognitive disorder   Will increase Lexapro to 10 mg from 5 mg  Stress management  Health Maintenance reviewed Diet and exercise encouraged  Follow up plan: 1 month  Jannifer Rodney, FNP

## 2022-06-30 ENCOUNTER — Ambulatory Visit (INDEPENDENT_AMBULATORY_CARE_PROVIDER_SITE_OTHER): Payer: Medicare Other | Admitting: Family

## 2022-06-30 ENCOUNTER — Encounter: Payer: Self-pay | Admitting: Family

## 2022-06-30 DIAGNOSIS — F411 Generalized anxiety disorder: Secondary | ICD-10-CM

## 2022-06-30 NOTE — Progress Notes (Signed)
Virtual Visit  Note Due to COVID-19 pandemic this visit was conducted virtually. This visit type was conducted due to national recommendations for restrictions regarding the COVID-19 Pandemic (e.g. social distancing, sheltering in place) in an effort to limit this patient's exposure and mitigate transmission in our community. All issues noted in this document were discussed and addressed.  A physical exam was not performed with this format.  I connected with [name] on 06/30/22 at 1:54 pm  by telephone and verified that I am speaking with the correct person using two identifiers. [name] is currently located at home and daughter-in-law is currently with her during visit. The provider, Jannifer Rodney, FNP is located in their office at time of visit.  I discussed the limitations, risks, security and privacy concerns of performing an evaluation and management service by telephone and the availability of in person appointments. I also discussed with the patient that there may be a patient responsible charge related to this service. The patient expressed understanding and agreed to proceed.  Ms. kameo, bains are scheduled for a virtual visit with your provider today.    Just as we do with appointments in the office, we must obtain your consent to participate.  Your consent will be active for this visit and any virtual visit you may have with one of our providers in the next 365 days.    If you have a MyChart account, I can also send a copy of this consent to you electronically.  All virtual visits are billed to your insurance company just like a traditional visit in the office.  As this is a virtual visit, video technology does not allow for your provider to perform a traditional examination.  This may limit your provider's ability to fully assess your condition.  If your provider identifies any concerns that need to be evaluated in person or the need to arrange testing such as labs, EKG, etc,  we will make arrangements to do so.    Although advances in technology are sophisticated, we cannot ensure that it will always work on either your end or our end.  If the connection with a video visit is poor, we may have to switch to a telephone visit.  With either a video or telephone visit, we are not always able to ensure that we have a secure connection.   I need to obtain your verbal consent now.   Are you willing to proceed with your visit today?   Andreka P Handshoe has provided verbal consent on 06/30/2022 for a virtual visit (video or telephone).   Jannifer Rodney, [place] 06/30/2022  1:56 PM    History and Present Illness:  Pt calls the office today to follow up on  GAD. She is currently taking Lexapro 10 mg. Family states this has greatly improved her anxiety. Anxiety Presents for follow-up visit. Symptoms include excessive worry, nervous/anxious behavior and restlessness. Patient reports no depressed mood, insomnia or irritability. Symptoms occur occasionally. The severity of symptoms is mild.        Review of Systems  Constitutional:  Negative for irritability.  Psychiatric/Behavioral:  The patient is nervous/anxious. The patient does not have insomnia.   All other systems reviewed and are negative.    Observations/Objective: No SOB or distress noted   Assessment and Plan: 1. GAD (generalized anxiety disorder) Continue Lexapro  Stress management  Follow up as needed   I discussed the assessment and treatment plan with the patient. The patient was provided  an opportunity to ask questions and all were answered. The patient agreed with the plan and demonstrated an understanding of the instructions.   The patient was advised to call back or seek an in-person evaluation if the symptoms worsen or if the condition fails to improve as anticipated.  The above assessment and management plan was discussed with the patient. The patient verbalized understanding of and has agreed to  the management plan. Patient is aware to call the clinic if symptoms persist or worsen. Patient is aware when to return to the clinic for a follow-up visit. Patient educated on when it is appropriate to go to the emergency department.   Time call ended: 2:05 pm   I provided 11 minutes of  non face-to-face time during this encounter.    Jannifer Rodney, FNP

## 2022-08-24 ENCOUNTER — Other Ambulatory Visit: Payer: Self-pay | Admitting: Family

## 2022-08-24 ENCOUNTER — Ambulatory Visit (INDEPENDENT_AMBULATORY_CARE_PROVIDER_SITE_OTHER): Payer: Medicare Other

## 2022-08-24 DIAGNOSIS — E039 Hypothyroidism, unspecified: Secondary | ICD-10-CM

## 2022-08-24 DIAGNOSIS — Z23 Encounter for immunization: Secondary | ICD-10-CM

## 2022-10-05 ENCOUNTER — Telehealth: Payer: Self-pay | Admitting: Family

## 2022-10-05 DIAGNOSIS — E039 Hypothyroidism, unspecified: Secondary | ICD-10-CM

## 2022-10-05 MED ORDER — LEVOTHYROXINE SODIUM 88 MCG PO TABS: 88.0000 ug | ORAL_TABLET | Freq: Every day | ORAL | 0 refills | Status: DC

## 2022-10-05 NOTE — Telephone Encounter (Signed)
  Prescription Request  10/05/2022  What is the name of the medication or equipment? THYROID MEDICINE  Have you contacted your pharmacy to request a refill? YES  Which pharmacy would you like this sent to? [facility]  Pt scheduled appt with PCP on 11/05/22 (first available). Needs refill sent in to last until appt.

## 2022-10-05 NOTE — Telephone Encounter (Signed)
Refill sent to pharmacy.   

## 2022-11-02 ENCOUNTER — Encounter: Payer: Medicare Other | Admitting: Psychology

## 2022-11-05 ENCOUNTER — Ambulatory Visit (INDEPENDENT_AMBULATORY_CARE_PROVIDER_SITE_OTHER): Payer: Medicare Other | Admitting: Family

## 2022-11-05 ENCOUNTER — Encounter: Payer: Self-pay | Admitting: Family

## 2022-11-05 VITALS — BP 134/80 | HR 70 | Temp 97.1°F | Ht 61.0 in | Wt 135.0 lb

## 2022-11-05 DIAGNOSIS — J209 Acute bronchitis, unspecified: Secondary | ICD-10-CM

## 2022-11-05 DIAGNOSIS — F02A4 Dementia in other diseases classified elsewhere, mild, with anxiety: Secondary | ICD-10-CM | POA: Diagnosis not present

## 2022-11-05 DIAGNOSIS — F411 Generalized anxiety disorder: Secondary | ICD-10-CM

## 2022-11-05 DIAGNOSIS — E039 Hypothyroidism, unspecified: Secondary | ICD-10-CM

## 2022-11-05 DIAGNOSIS — G300 Alzheimer's disease with early onset: Secondary | ICD-10-CM | POA: Diagnosis not present

## 2022-11-05 DIAGNOSIS — Z0001 Encounter for general adult medical examination with abnormal findings: Secondary | ICD-10-CM

## 2022-11-05 DIAGNOSIS — E559 Vitamin D deficiency, unspecified: Secondary | ICD-10-CM

## 2022-11-05 DIAGNOSIS — Z0000 Encounter for general adult medical examination without abnormal findings: Secondary | ICD-10-CM | POA: Diagnosis not present

## 2022-11-05 DIAGNOSIS — E785 Hyperlipidemia, unspecified: Secondary | ICD-10-CM | POA: Diagnosis not present

## 2022-11-05 DIAGNOSIS — G3184 Mild cognitive impairment, so stated: Secondary | ICD-10-CM | POA: Diagnosis not present

## 2022-11-05 MED ORDER — PREDNISONE 20 MG PO TABS: 40.0000 mg | ORAL_TABLET | Freq: Every day | ORAL | 0 refills | Status: AC

## 2022-11-05 MED ORDER — LEVOTHYROXINE SODIUM 88 MCG PO TABS: 88.0000 ug | ORAL_TABLET | Freq: Every day | ORAL | 2 refills | Status: DC

## 2022-11-05 NOTE — Progress Notes (Signed)
Subjective:    Patient ID: [name], female    DOB: 11-05-1940, 82 y.o.   MRN: [id number]  Chief Complaint  Patient presents with   Medical Management of Chronic Issues   Cough    X 1 WEEK DIZZY WITH IT AND SOME DRAINAGE TAKING LORATADINE OVER THE COUNTER    Pt presents to the office today for CPE and  chronic follow up. She is followed by the Neurologists 6 months for memory changes and neurocognitive disorder.     She has moved and one of her sisters passed.  Cough This is a recurrent problem. The current episode started 1 to 4 weeks ago. The problem has been gradually worsening. The problem occurs every few minutes. The cough is Productive of sputum. Associated symptoms include ear congestion. Pertinent negatives include no chills, ear pain, fever, headaches, myalgias, nasal congestion, postnasal drip, sore throat, shortness of breath or wheezing. She has tried rest for the symptoms. The treatment provided mild relief. Her past medical history is significant for asthma.  Asthma She complains of cough. There is no hoarse voice, shortness of breath or wheezing. This is a chronic problem. The current episode started more than 1 year ago. The problem occurs intermittently. Associated symptoms include ear congestion. Pertinent negatives include no ear pain, fever, headaches, myalgias, nasal congestion, postnasal drip or sore throat. Her symptoms are alleviated by rest. She reports moderate improvement on treatment. Her past medical history is significant for asthma.  Thyroid Problem Presents for follow-up visit. Symptoms include anxiety. Patient reports no constipation, depressed mood, dry skin, fatigue or hoarse voice. The symptoms have been stable. Her past medical history is significant for hyperlipidemia.  Anxiety Presents for follow-up visit. Symptoms include excessive worry and nervous/anxious behavior. Patient reports no depressed mood, irritability or shortness of breath. Symptoms  occur occasionally. The severity of symptoms is mild.   Her past medical history is significant for asthma.  Hyperlipidemia This is a chronic problem. The current episode started more than 1 year ago. Exacerbating diseases include obesity. Pertinent negatives include no myalgias or shortness of breath. Current antihyperlipidemic treatment includes diet change. The current treatment provides mild improvement of lipids. Risk factors for coronary artery disease include dyslipidemia, hypertension, a sedentary lifestyle and post-menopausal.      Review of Systems  Constitutional:  Negative for chills, fatigue, fever and irritability.  HENT:  Negative for ear pain, hoarse voice, postnasal drip and sore throat.   Respiratory:  Positive for cough. Negative for shortness of breath and wheezing.   Gastrointestinal:  Negative for constipation.  Musculoskeletal:  Negative for myalgias.  Neurological:  Negative for headaches.  Psychiatric/Behavioral:  The patient is nervous/anxious.   All other systems reviewed and are negative.  Family History  Problem Relation Age of Onset   Heart failure Mother    Emphysema Mother    Other Father        blood clot in brain   Breast cancer Sister    Cancer Sister        breast   Memory loss Sister    Breast cancer Sister    Cancer Sister        breast   Asthma Sister    Cancer Brother        lung   Memory loss Sister    Anesthesia problems Neg Hx    Hypotension Neg Hx    Malignant hyperthermia Neg Hx    Pseudochol deficiency Neg Hx    Social  History   Socioeconomic History   Marital status: Widowed    Spouse name: Not on file   Number of children: 3   Years of education: 84   Highest education level: Not on file  Occupational History   Occupation: retired Engineer, manufacturing systems  Tobacco Use   Smoking status: Never   Smokeless tobacco: Never  Vaping Use   Vaping Use: Never used  Substance and Sexual Activity   Alcohol use: No   Drug use: No    Sexual activity: Not on file  Other Topics Concern   Not on file  Social History Narrative   Lives at home alone    Right handed   Caffeine: 3 cups/day coffee, coke    Social Determinants of Health   Financial Resource Strain: Not on file  Food Insecurity: Not on file  Transportation Needs: Not on file  Physical Activity: Not on file  Stress: Not on file  Social Connections: Not on file       Objective:   Physical Exam Vitals reviewed.  Constitutional:      General: She is not in acute distress.    Appearance: She is well-developed.  HENT:     Head: Normocephalic and atraumatic.     Right Ear: Tympanic membrane normal.     Left Ear: Tympanic membrane normal.  Eyes:     Pupils: Pupils are equal, round, and reactive to light.  Neck:     Thyroid: No thyromegaly.  Cardiovascular:     Rate and Rhythm: Normal rate and regular rhythm.     Heart sounds: Normal heart sounds. No murmur heard. Pulmonary:     Effort: Pulmonary effort is normal. No respiratory distress.     Breath sounds: Normal breath sounds. No wheezing.  Abdominal:     General: Bowel sounds are normal. There is no distension.     Palpations: Abdomen is soft.     Tenderness: There is no abdominal tenderness.  Musculoskeletal:        General: No tenderness. Normal range of motion.     Cervical back: Normal range of motion and neck supple.  Skin:    General: Skin is warm and dry.  Neurological:     Mental Status: She is alert and oriented to person, place, and time.     Cranial Nerves: No cranial nerve deficit.     Deep Tendon Reflexes: Reflexes are normal and symmetric.  Psychiatric:        Behavior: Behavior normal.        Thought Content: Thought content normal.        Cognition and Memory: Cognition is impaired. Memory is impaired.        Judgment: Judgment normal.      BP 134/80   Pulse 70   Temp (!) 97.1 F (36.2 C) (Temporal)   Ht 5\' 1"  (1.549 m)   Wt 135 lb (61.2 kg)   SpO2 96%   BMI  25.51 kg/m       Assessment & Plan:  [name] comes in today with chief complaint of Medical Management of Chronic Issues and Cough (X 1 WEEK DIZZY WITH IT AND SOME DRAINAGE TAKING LORATADINE OVER THE COUNTER )   Diagnosis and orders addressed:  1. Mild neurocognitive disorder - CMP14+EGFR - CBC with Differential/Platelet  2. Hypothyroidism, unspecified type - CMP14+EGFR - CBC with Differential/Platelet - TSH - levothyroxine (SYNTHROID) 88 MCG tablet; Take 1 tablet (88 mcg total) by mouth daily. (NEEDS TO BE  SEEN BEFORE NEXT REFILL)  Dispense: 90 tablet; Refill: 2  3. Hyperlipidemia, unspecified hyperlipidemia type  - CMP14+EGFR - CBC with Differential/Platelet - Lipid panel  4. GAD (generalized anxiety disorder) - CMP14+EGFR - CBC with Differential/Platelet  5. Vitamin D deficiency - CMP14+EGFR - CBC with Differential/Platelet - VITAMIN D 25 Hydroxy (Vit-D Deficiency, Fractures)  6. 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 Annual physical exam - CMP14+EGFR - CBC with Differential/Platelet - Lipid panel - TSH  8. Acute bronchitis, unspecified organism - Take meds as prescribed - Use a cool mist humidifier  -Use saline nose sprays frequently -Force fluids -For any cough or congestion  Use plain Mucinex- regular strength or max strength is fine -For fever or aces or pains- take tylenol or ibuprofen. -Throat lozenges if help -Follow up if symptoms worsen or do not improve  - predniSONE (DELTASONE) 20 MG tablet; Take 2 tablets (40 mg total) by mouth daily with breakfast for 5 days.  Dispense: 10 tablet; Refill: 0   Labs pending Call daughter in law who was in the car waiting on patient to discuss medications  Health Maintenance reviewed Diet and exercise encouraged  Follow up plan: 4 months    Jannifer Rodney, FNP

## 2022-11-05 NOTE — Patient Instructions (Addendum)

## 2022-11-06 LAB — CBC WITH DIFFERENTIAL/PLATELET
Basophils Absolute: 0.1 10*3/uL (ref 0.0–0.2)
Basos: 1 %
EOS (ABSOLUTE): 0.2 10*3/uL (ref 0.0–0.4)
Eos: 3 %
Hematocrit: 40.3 % (ref 34.0–46.6)
Hemoglobin: 13.4 g/dL (ref 11.1–15.9)
Immature Grans (Abs): 0 10*3/uL (ref 0.0–0.1)
Immature Granulocytes: 0 %
Lymphocytes Absolute: 1.4 10*3/uL (ref 0.7–3.1)
Lymphs: 24 %
MCH: 30.7 pg (ref 26.6–33.0)
MCHC: 33.3 g/dL (ref 31.5–35.7)
MCV: 92 fL (ref 79–97)
Monocytes Absolute: 0.6 10*3/uL (ref 0.1–0.9)
Monocytes: 10 %
Neutrophils Absolute: 3.6 10*3/uL (ref 1.4–7.0)
Neutrophils: 62 %
Platelets: 263 10*3/uL (ref 150–450)
RBC: 4.37 x10E6/uL (ref 3.77–5.28)
RDW: 12.6 % (ref 11.7–15.4)
WBC: 5.9 10*3/uL (ref 3.4–10.8)

## 2022-11-06 LAB — CMP14+EGFR
ALT: 16 IU/L (ref 0–32)
AST: 14 IU/L (ref 0–40)
Albumin/Globulin Ratio: 1.8 (ref 1.2–2.2)
Albumin: 4.4 g/dL (ref 3.8–4.8)
Alkaline Phosphatase: 55 IU/L (ref 44–121)
BUN/Creatinine Ratio: 23 (ref 12–28)
BUN: 17 mg/dL (ref 8–27)
Bilirubin Total: 0.5 mg/dL (ref 0.0–1.2)
CO2: 22 mmol/L (ref 20–29)
Calcium: 9.8 mg/dL (ref 8.7–10.3)
Chloride: 103 mmol/L (ref 96–106)
Creatinine, Ser: 0.74 mg/dL (ref 0.57–1.00)
Globulin, Total: 2.5 g/dL (ref 1.5–4.5)
Glucose: 88 mg/dL (ref 70–99)
Potassium: 4.1 mmol/L (ref 3.5–5.2)
Sodium: 141 mmol/L (ref 134–144)
Total Protein: 6.9 g/dL (ref 6.0–8.5)
eGFR: 82 mL/min/{1.73_m2} (ref >59)

## 2022-11-06 LAB — LIPID PANEL
Chol/HDL Ratio: 4.2 ratio (ref 0.0–4.4)
Cholesterol, Total: 178 mg/dL (ref 100–199)
HDL: 42 mg/dL (ref >39)
LDL Chol Calc (NIH): 120 mg/dL — ABNORMAL HIGH (ref 0–99)
Triglycerides: 85 mg/dL (ref 0–149)
VLDL Cholesterol Cal: 16 mg/dL (ref 5–40)

## 2022-11-06 LAB — TSH: TSH: 1.62 u[IU]/mL (ref 0.450–4.500)

## 2022-11-06 LAB — VITAMIN D 25 HYDROXY (VIT D DEFICIENCY, FRACTURES): Vit D, 25-Hydroxy: 32.7 ng/mL (ref 30.0–100.0)

## 2022-11-09 ENCOUNTER — Encounter: Payer: Medicare Other | Admitting: Psychology

## 2023-02-02 ENCOUNTER — Ambulatory Visit: Payer: Medicare Other | Admitting: Neurology

## 2023-03-02 DIAGNOSIS — Z1231 Encounter for screening mammogram for malignant neoplasm of breast: Secondary | ICD-10-CM | POA: Diagnosis not present

## 2023-03-05 ENCOUNTER — Telehealth: Payer: Self-pay | Admitting: Family

## 2023-03-05 DIAGNOSIS — R928 Other abnormal and inconclusive findings on diagnostic imaging of breast: Secondary | ICD-10-CM

## 2023-03-05 NOTE — Telephone Encounter (Signed)
Penny aware 

## 2023-03-05 NOTE — Telephone Encounter (Signed)
Ordereds faxed

## 2023-03-05 NOTE — Telephone Encounter (Signed)
Diagnostic mammogram ordered.  

## 2023-03-05 NOTE — Telephone Encounter (Signed)
Patient had a mammogram at [facility] on 5/7 and was told that she needed orders for diagnostic. They were told that the orders would have to be placed by her PCP and sent to [facility] [phone], she is supposed to have this done on 5/15

## 2023-03-10 DIAGNOSIS — N6489 Other specified disorders of breast: Secondary | ICD-10-CM | POA: Diagnosis not present

## 2023-03-10 DIAGNOSIS — Z803 Family history of malignant neoplasm of breast: Secondary | ICD-10-CM | POA: Diagnosis not present

## 2023-03-10 DIAGNOSIS — R922 Inconclusive mammogram: Secondary | ICD-10-CM | POA: Diagnosis not present

## 2023-03-15 ENCOUNTER — Encounter: Payer: Self-pay | Admitting: Family

## 2023-05-12 ENCOUNTER — Other Ambulatory Visit: Payer: Self-pay | Admitting: Family Medicine

## 2023-05-12 DIAGNOSIS — Z1211 Encounter for screening for malignant neoplasm of colon: Secondary | ICD-10-CM

## 2023-05-24 ENCOUNTER — Ambulatory Visit (INDEPENDENT_AMBULATORY_CARE_PROVIDER_SITE_OTHER): Payer: Medicare Other | Admitting: Family

## 2023-05-24 ENCOUNTER — Encounter: Payer: Self-pay | Admitting: Family

## 2023-05-24 VITALS — BP 131/82 | HR 89 | Temp 97.3°F | Ht 61.0 in | Wt 138.6 lb

## 2023-05-24 DIAGNOSIS — E785 Hyperlipidemia, unspecified: Secondary | ICD-10-CM | POA: Diagnosis not present

## 2023-05-24 DIAGNOSIS — F411 Generalized anxiety disorder: Secondary | ICD-10-CM | POA: Diagnosis not present

## 2023-05-24 DIAGNOSIS — E039 Hypothyroidism, unspecified: Secondary | ICD-10-CM

## 2023-05-24 DIAGNOSIS — G300 Alzheimer's disease with early onset: Secondary | ICD-10-CM

## 2023-05-24 DIAGNOSIS — G3184 Mild cognitive impairment, so stated: Secondary | ICD-10-CM

## 2023-05-24 DIAGNOSIS — F4321 Adjustment disorder with depressed mood: Secondary | ICD-10-CM

## 2023-05-24 DIAGNOSIS — J4540 Moderate persistent asthma, uncomplicated: Secondary | ICD-10-CM

## 2023-05-24 DIAGNOSIS — Z23 Encounter for immunization: Secondary | ICD-10-CM

## 2023-05-24 MED ORDER — ESCITALOPRAM OXALATE 20 MG PO TABS
20.0000 mg | ORAL_TABLET | Freq: Every day | ORAL | 1 refills | Status: DC
Start: 2023-05-24 — End: 2023-06-01

## 2023-05-24 NOTE — Progress Notes (Signed)
Subjective:    Patient ID: [name], female    DOB: [birth year], 82 y.o.   MRN: [id number]  Chief Complaint  Patient presents with   Medical Management of Chronic Issues   Pt presents to the office today for chronic follow up. She was followed by the Neurologists for memory changes and neurocognitive disorder.  She tried different medications but was allergic to those. States she does not want to go back to see them.    She reports her son passed away in 01-12-23 and having a hard time dealing with this. She is living by herself, but has family bring her meals daily and cleans her home. Her daughter in law brings her in today.  Asthma She complains of hoarse voice. There is no cough or wheezing. This is a chronic problem. The current episode started more than 1 year ago. The problem occurs intermittently. She reports moderate improvement on treatment. Her past medical history is significant for asthma.  Thyroid Problem Presents for follow-up visit. Symptoms include anxiety, depressed mood, diarrhea and hoarse voice. Patient reports no constipation, dry skin or fatigue. The symptoms have been stable. Her past medical history is significant for hyperlipidemia.  Hyperlipidemia This is a chronic problem. The current episode started more than 1 year ago. The problem is uncontrolled. Exacerbating diseases include obesity. Current antihyperlipidemic treatment includes diet change. The current treatment provides mild improvement of lipids. Risk factors for coronary artery disease include dyslipidemia, hypertension, a sedentary lifestyle and post-menopausal.  Anxiety Presents for follow-up visit. Symptoms include depressed mood, excessive worry, nervous/anxious behavior and restlessness. Patient reports no dry mouth. Symptoms occur most days. The severity of symptoms is moderate.   Her past medical history is significant for asthma.      Review of Systems  Constitutional:  Negative for fatigue.   HENT:  Positive for hoarse voice.   Respiratory:  Negative for cough and wheezing.   Gastrointestinal:  Positive for diarrhea. Negative for constipation.  Psychiatric/Behavioral:  The patient is nervous/anxious.   All other systems reviewed and are negative.      Objective:   Physical Exam Vitals reviewed.  Constitutional:      General: She is not in acute distress.    Appearance: She is well-developed.  HENT:     Head: Normocephalic and atraumatic.     Right Ear: Tympanic membrane and external ear normal.     Left Ear: Tympanic membrane normal.  Eyes:     Pupils: Pupils are equal, round, and reactive to light.  Neck:     Thyroid: No thyromegaly.  Cardiovascular:     Rate and Rhythm: Normal rate and regular rhythm.     Heart sounds: Normal heart sounds. No murmur heard. Pulmonary:     Effort: Pulmonary effort is normal. No respiratory distress.     Breath sounds: Normal breath sounds. No wheezing.  Abdominal:     General: Bowel sounds are normal. There is no distension.     Palpations: Abdomen is soft.     Tenderness: There is no abdominal tenderness.  Musculoskeletal:        General: No tenderness. Normal range of motion.     Cervical back: Normal range of motion and neck supple.  Skin:    General: Skin is warm and dry.  Neurological:     Mental Status: She is alert and oriented to person, place, and time.     Cranial Nerves: No cranial nerve deficit.     Deep  Tendon Reflexes: Reflexes are normal and symmetric.  Psychiatric:        Behavior: Behavior normal.        Thought Content: Thought content normal.        Cognition and Memory: Cognition is impaired. Memory is impaired.        Judgment: Judgment normal.     BP 131/82   Pulse 89   Temp (!) 97.3 F (36.3 C) (Temporal)   Ht 5\' 1"  (1.549 m)   Wt 138 lb 9.6 oz (62.9 kg)   SpO2 92%   BMI 26.19 kg/m        Assessment & Plan:   [name] comes in today with chief complaint of Medical Management  of Chronic Issues   Diagnosis and orders addressed:  1. Moderate persistent asthma in adult without complication - CBC with Differential/Platelet - CMP14+EGFR  2. GAD (generalized anxiety disorder) - escitalopram (LEXAPRO) 20 MG tablet; Take 1 tablet (20 mg total) by mouth daily.  Dispense: 90 tablet; Refill: 1 - CBC with Differential/Platelet - CMP14+EGFR  3. Hypothyroidism, unspecified type - CBC with Differential/Platelet - CMP14+EGFR - TSH  4. Hyperlipidemia, unspecified hyperlipidemia type - CBC with Differential/Platelet - CMP14+EGFR  5. Mild neurocognitive disorder - CBC with Differential/Platelet - CMP14+EGFR  6. 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 Grief    Labs pending Will increase Lexapro to 20 mg from 10 mg Stress management  Health Maintenance reviewed Diet and exercise encouraged  Follow up plan: 6 months   Gabrielle Rodney, FNP

## 2023-05-24 NOTE — Patient Instructions (Signed)

## 2023-06-01 ENCOUNTER — Encounter: Payer: Self-pay | Admitting: Family

## 2023-06-01 ENCOUNTER — Ambulatory Visit (INDEPENDENT_AMBULATORY_CARE_PROVIDER_SITE_OTHER): Payer: Medicare Other | Admitting: Family

## 2023-06-01 VITALS — BP 136/81 | HR 77 | Temp 98.3°F | Ht 61.0 in | Wt 138.4 lb

## 2023-06-01 DIAGNOSIS — R410 Disorientation, unspecified: Secondary | ICD-10-CM | POA: Diagnosis not present

## 2023-06-01 DIAGNOSIS — F411 Generalized anxiety disorder: Secondary | ICD-10-CM

## 2023-06-01 DIAGNOSIS — H66002 Acute suppurative otitis media without spontaneous rupture of ear drum, left ear: Secondary | ICD-10-CM | POA: Diagnosis not present

## 2023-06-01 DIAGNOSIS — R5383 Other fatigue: Secondary | ICD-10-CM | POA: Diagnosis not present

## 2023-06-01 MED ORDER — AMOXICILLIN 500 MG PO CAPS
500.0000 mg | ORAL_CAPSULE | Freq: Two times a day (BID) | ORAL | 0 refills | Status: AC
Start: 2023-06-01 — End: 2023-06-11

## 2023-06-01 MED ORDER — ESCITALOPRAM OXALATE 10 MG PO TABS: 10.0000 mg | ORAL_TABLET | Freq: Every day | ORAL | 3 refills | Status: DC

## 2023-06-01 NOTE — Patient Instructions (Signed)

## 2023-06-01 NOTE — Progress Notes (Signed)
Subjective:    Patient ID: [name], female    DOB: January 03, 1941, 82 y.o.   MRN: [id number]  Chief Complaint  Patient presents with   Ear Pain    Left side neck    Neck Pain    Left side    Pt presents to the office today with left ear pain that started a week ago.   She was seen last week and we increased her lexapro to 20 mg from 10 mg. Daughter in law has noticed increase fatigue and confusion. Neck Pain  Pertinent negatives include no headaches.  Otalgia  There is pain in the left ear. This is a new problem. The current episode started in the past 7 days. The problem occurs every few minutes. The problem has been gradually worsening. There has been no fever. The pain is at a severity of 7/10. The pain is moderate. Associated symptoms include hearing loss and neck pain. Pertinent negatives include no coughing, ear discharge, headaches, rhinorrhea or sore throat. She has tried nothing for the symptoms. The treatment provided no relief.      Review of Systems  HENT:  Positive for ear pain and hearing loss. Negative for ear discharge, rhinorrhea and sore throat.   Respiratory:  Negative for cough.   Musculoskeletal:  Positive for neck pain.  Neurological:  Negative for headaches.  All other systems reviewed and are negative.      Objective:   Physical Exam Vitals reviewed.  Constitutional:      General: She is not in acute distress.    Appearance: She is well-developed.  HENT:     Head: Normocephalic and atraumatic.     Right Ear: Tympanic membrane normal.     Left Ear: Tenderness present. A middle ear effusion is present. Tympanic membrane is erythematous.  Eyes:     Pupils: Pupils are equal, round, and reactive to light.  Neck:     Thyroid: No thyromegaly.  Cardiovascular:     Rate and Rhythm: Normal rate and regular rhythm.     Heart sounds: Normal heart sounds. No murmur heard. Pulmonary:     Effort: Pulmonary effort is normal. No respiratory distress.      Breath sounds: Normal breath sounds. No wheezing.  Abdominal:     General: Bowel sounds are normal. There is no distension.     Palpations: Abdomen is soft.     Tenderness: There is no abdominal tenderness.  Musculoskeletal:        General: No tenderness. Normal range of motion.     Cervical back: Normal range of motion and neck supple.  Skin:    General: Skin is warm and dry.  Neurological:     Mental Status: She is alert and oriented to person, place, and time.     Cranial Nerves: No cranial nerve deficit.     Deep Tendon Reflexes: Reflexes are normal and symmetric.  Psychiatric:        Behavior: Behavior normal.        Thought Content: Thought content normal.        Judgment: Judgment normal.       BP 136/81   Pulse 77   Temp 98.3 F (36.8 C) (Temporal)   Ht 5\' 1"  (1.549 m)   Wt 138 lb 6.4 oz (62.8 kg)   BMI 26.15 kg/m      Assessment & Plan:  [name] comes in today with chief complaint of Ear Pain (Left side neck )  and Neck Pain (Left side )   Diagnosis and orders addressed:  1. Non-recurrent acute suppurative otitis media of left ear without spontaneous rupture of tympanic membrane Start Amoxicillin - Take meds as prescribed - Use a cool mist humidifier  -Use saline nose sprays frequently -Force fluids -For any cough or congestion  Use plain Mucinex- regular strength or max strength is fine -For fever or aces or pains- take tylenol or ibuprofen. -Throat lozenges if help Follow up if symptoms worsen or do not improve   - amoxicillin (AMOXIL) 500 MG capsule; Take 1 capsule (500 mg total) by mouth 2 (two) times daily for 10 days.  Dispense: 20 capsule; Refill: 0  2. GAD (generalized anxiety disorder) Will decrease Lexapro to 10 mg from 20 mg given fatigue and increased confusion    3. Other fatigue  4. Confusion  Keep chronic follow up  Jannifer Rodney, FNP

## 2023-06-14 ENCOUNTER — Ambulatory Visit (HOSPITAL_COMMUNITY)
Admission: RE | Admit: 2023-06-14 | Discharge: 2023-06-14 | Disposition: A | Discharge status: Home / Self Care | Payer: Medicare Other | Source: Ambulatory Visit | Attending: Family | Admitting: Family

## 2023-06-14 ENCOUNTER — Encounter: Payer: Self-pay | Admitting: Family

## 2023-06-14 ENCOUNTER — Ambulatory Visit (INDEPENDENT_AMBULATORY_CARE_PROVIDER_SITE_OTHER): Payer: Medicare Other | Admitting: Family

## 2023-06-14 VITALS — BP 148/86 | HR 92 | Temp 98.2°F | Wt 138.0 lb

## 2023-06-14 DIAGNOSIS — M25561 Pain in right knee: Secondary | ICD-10-CM | POA: Diagnosis not present

## 2023-06-14 DIAGNOSIS — M79604 Pain in right leg: Secondary | ICD-10-CM

## 2023-06-14 DIAGNOSIS — M79661 Pain in right lower leg: Secondary | ICD-10-CM | POA: Diagnosis not present

## 2023-06-14 DIAGNOSIS — M7989 Other specified soft tissue disorders: Secondary | ICD-10-CM

## 2023-06-14 NOTE — Patient Instructions (Signed)
Deep Vein Thrombosis  Deep vein thrombosis (DVT) is a condition in which a blood clot forms in a vein of the deep venous system. This can occur in the lower leg, thigh, pelvis, arm, or neck. A clot is blood that has thickened into a gel or solid. This condition is serious and can be life-threatening if the clot travels to the arteries of the lungs and causes a blockage (pulmonary embolism). A DVT can also damage veins in the leg, which can lead to long-term venous disease, leg pain, swelling, discoloration, and ulcers or sores (post-thrombotic syndrome). What are the causes? This condition may be caused by: A slowdown of blood flow. Damage to a vein. A condition that causes blood to clot more easily, such as certain bleeding disorders. What increases the risk? The following factors may make you more likely to develop this condition: Obesity. Being older, especially older than age 60. Being inactive or not moving around (sedentary lifestyle). This may include: Sitting or lying down for longer than 4-6 hours other than to sleep at night. Being in the hospital, or having major or lengthy surgery. Having any recent bone injuries, such as breaks (fractures), that reduce movement, especially in the lower extremities. Having recent orthopedic surgery on the lower extremities. Being pregnant, giving birth, or having recently given birth. Taking medicines that contain estrogen, such as birth control or hormone replacement therapy. Using products that contain nicotine or tobacco, especially if you use hormonal birth control. Having a history of a blood vessel disease (peripheral vascular disease) or congestive heart disease. Having a history of cancer, especially if being treated with chemotherapy. What are the signs or symptoms? Symptoms of this condition include: Swelling, pain, pressure, or tenderness in an arm or a leg. An arm or a leg becoming warm, red, or discolored. A leg turning very pale or  blue. You may have a large DVT. This is rare. If the clot is in your leg, you may notice that symptoms get worse when you stand or walk. In some cases, there are no symptoms. How is this diagnosed? This condition is diagnosed with: Your medical history and a physical exam. Tests, such as: Blood tests to check how well your blood clots. Doppler ultrasound. This is the best way to find a DVT. CT venogram. Contrast dye is injected into a vein, and X-rays are taken to check for clots. This is helpful for veins in the chest or pelvis. How is this treated? Treatment for this condition depends on: The cause of your DVT. The size and location of your DVT, or having more than one DVT. Your risk for bleeding or developing more clots. Other medical conditions you may have. Treatment may include: Taking a blood thinner medicine (anticoagulant) to prevent more clots from forming or current clots from growing. Wearing compression stockings. Injecting medicines into the affected vein to break up the clot (catheter-directed thrombolysis). Surgical procedures, when DVT is severe or hard to treat. These may be done to: Isolate and remove your clot. Place an inferior vena cava (IVC) filter. This filter is placed into a large vein called the inferior vena cava to catch blood clots before they reach your lungs. You may get some medical treatments for 6 months or longer. Follow these instructions at home: If you are taking blood thinners: Talk with your health care provider before you take any medicines that contain aspirin or NSAIDs, such as ibuprofen. These medicines increase your risk for dangerous bleeding. Take your medicine exactly   as told, at the same time every day. Do not skip a dose. Do not take more than the prescribed dose. This is important. Ask your health care provider about foods and medicines that could change or interact with the way your blood thinner works. Avoid these foods and medicines  if you are told to do so. Avoid anything that may cause bleeding or bruising. You may bleed more easily while taking blood thinners. Be very careful when using knives, scissors, or other sharp objects. Use an electric razor instead of a blade. Avoid activities that could cause injury or bruising, and follow instructions for preventing falls. Tell your health care provider if you have had any internal bleeding, bleeding ulcers, or neurologic diseases, such as strokes or cerebral aneurysms. Wear a medical alert bracelet or carry a card that lists what medicines you take. General instructions Take over-the-counter and prescription medicines only as told by your health care provider. Return to your normal activities as told by your health care provider. Ask your health care provider what activities are safe for you. If recommended, wear compression stockings as told by your health care provider. These stockings help to prevent blood clots and reduce swelling in your legs. Never wear your compression stockings while sleeping at night. Keep all follow-up visits. This is important. Where to find more information American Heart Association: www.heart.org Centers for Disease Control and Prevention: www.cdc.gov National Heart, Lung, and Blood Institute: www.nhlbi.nih.gov Contact a health care provider if: You miss a dose of your blood thinner. You have unusual bruising or other color changes. You have new or worse pain, swelling, or redness in an arm or a leg. You have worsening numbness or tingling in an arm or a leg. You have a significant color change (pale or blue) in the extremity that has the DVT. Get help right away if: You have signs or symptoms that a blood clot has moved to the lungs. These may include: Shortness of breath. Chest pain. Fast or irregular heartbeats (palpitations). Light-headedness, dizziness, or fainting. Coughing up blood. You have signs or symptoms that your blood is  too thin. These may include: Blood in your vomit, stool, or urine. A cut that will not stop bleeding. A menstrual period that is heavier than usual. A severe headache or confusion. These symptoms may be an emergency. Get help right away. Call 911. Do not wait to see if the symptoms will go away. Do not drive yourself to the hospital. Summary Deep vein thrombosis (DVT) happens when a blood clot forms in a deep vein. This may occur in the lower leg, thigh, pelvis, arm, or neck. Symptoms affect the arm or leg and can include swelling, pain, tenderness, warmth, redness, or discoloration. This condition may be treated with medicines. In severe cases, a procedure or surgery may be done to remove or dissolve the clots. If you are taking blood thinners, take them exactly as told. Do not skip a dose. Do not take more than is prescribed. Get help right away if you have a severe headache, shortness of breath, chest pain, fast or irregular heartbeats, or blood in your vomit, urine, or stool. This information is not intended to replace advice given to you by your health care provider. Make sure you discuss any questions you have with your health care provider. Document Revised: 05/05/2021 Document Reviewed: 05/05/2021 Elsevier Patient Education  2024 Elsevier Inc.  

## 2023-06-14 NOTE — Progress Notes (Signed)
Subjective:    Patient ID: [name], female    DOB: 1941-09-17, 82 y.o.   MRN: [id number]  Chief Complaint  Patient presents with   Knee Pain    Right knee pain started Saturday    PT presents to the office today with right knee pain that started Saturday.  Knee Pain  The incident occurred 2 days ago. There was no injury mechanism. The pain is present in the right knee. The quality of the pain is described as aching. The pain is at a severity of 7/10. The pain is moderate. The pain has been Intermittent since onset. Pertinent negatives include no loss of motion, loss of sensation, numbness or tingling. She reports no foreign bodies present. The symptoms are aggravated by movement and weight bearing. She has tried rest and acetaminophen for the symptoms. The treatment provided mild relief.      Review of Systems  Neurological:  Negative for tingling and numbness.  All other systems reviewed and are negative.  Family History  Problem Relation Age of Onset   Heart failure Mother    Emphysema Mother    Other Father        blood clot in brain   Breast cancer Sister    Cancer Sister        breast   Memory loss Sister    Breast cancer Sister    Cancer Sister        breast   Asthma Sister    Cancer Brother        lung   Memory loss Sister    Anesthesia problems Neg Hx    Hypotension Neg Hx    Malignant hyperthermia Neg Hx    Pseudochol deficiency Neg Hx    Social History   Socioeconomic History   Marital status: Widowed    Spouse name: Not on file   Number of children: 3   Years of education: 74   Highest education level: Not on file  Occupational History   Occupation: retired Scientist, product/process development  Tobacco Use   Smoking status: Never   Smokeless tobacco: Never  Vaping Use   Vaping status: Never Used  Substance and Sexual Activity   Alcohol use: No   Drug use: No   Sexual activity: Not on file  Other Topics Concern   Not on file  Social History Narrative    Lives at home alone    Right handed   Caffeine: 3 cups/day coffee, coke    Social Determinants of Health   Financial Resource Strain: Not on file  Food Insecurity: Not on file  Transportation Needs: Not on file  Physical Activity: Not on file  Stress: Not on file  Social Connections: Not on file        Objective:   Physical Exam Vitals reviewed.  Constitutional:      General: She is not in acute distress.    Appearance: She is well-developed.  HENT:     Head: Normocephalic and atraumatic.  Eyes:     Pupils: Pupils are equal, round, and reactive to light.  Neck:     Thyroid: No thyromegaly.  Cardiovascular:     Rate and Rhythm: Normal rate and regular rhythm.     Heart sounds: Normal heart sounds. No murmur heard. Pulmonary:     Effort: Pulmonary effort is normal. No respiratory distress.     Breath sounds: Normal breath sounds. No wheezing.  Abdominal:     General: Bowel sounds are  normal. There is no distension.     Palpations: Abdomen is soft.     Tenderness: There is no abdominal tenderness.  Musculoskeletal:        General: Tenderness present. Normal range of motion.     Cervical back: Normal range of motion and neck supple.     Right lower leg: Edema (2+) present.     Comments: 2+ swelling in right leg, slight redness in right knee, positive homan's sign  Skin:    General: Skin is warm and dry.  Neurological:     Mental Status: She is alert and oriented to person, place, and time.     Cranial Nerves: No cranial nerve deficit.     Deep Tendon Reflexes: Reflexes are normal and symmetric.  Psychiatric:        Behavior: Behavior normal.        Thought Content: Thought content normal.        Judgment: Judgment normal.       BP (!) 148/86   Pulse 92   Temp 98.2 F (36.8 C) (Temporal)   Wt 138 lb (62.6 kg)   SpO2 95%   BMI 26.07 kg/m      Assessment & Plan:  [name] comes in today with chief complaint of Knee Pain (Right knee pain started  Saturday )   Diagnosis and orders addressed:  1. Right leg swelling - US Venous Img Lower Unilateral Right; Future  2. Right leg pain - US Venous Img Lower Unilateral Right; Future  3. Acute pain of right knee  - US Venous Img Lower Unilateral Right; Future  4. Right calf pain - US Venous Img Lower Unilateral Right; Future  Stat doppler placed Keep elevated If negative will treat as cellulitis given redness, warmth, and swelling Approx 50 mins spent with patient, chart review, education, and reviewing imaging.    Jannifer Rodney, FNP

## 2023-06-15 ENCOUNTER — Other Ambulatory Visit: Payer: Self-pay | Admitting: Family

## 2023-06-15 MED ORDER — DOXYCYCLINE HYCLATE 100 MG PO TABS: 100.0000 mg | ORAL_TABLET | Freq: Two times a day (BID) | ORAL | 0 refills | Status: DC

## 2023-06-15 MED ORDER — DICLOFENAC SODIUM 75 MG PO TBEC: 75.0000 mg | DELAYED_RELEASE_TABLET | Freq: Two times a day (BID) | ORAL | 1 refills | Status: DC

## 2023-07-02 ENCOUNTER — Telehealth: Payer: Self-pay | Admitting: Family

## 2023-07-02 NOTE — Telephone Encounter (Signed)
Spoke with hawks it was okay advised to skip tomorrow and go back to taking Sunday

## 2023-07-02 NOTE — Telephone Encounter (Signed)
Daughter says pt has taken too much of her thyroid medicine. Was only supposed to take 1 tablet and she took 3 tablets. Needs advise on what to do.

## 2023-07-19 ENCOUNTER — Other Ambulatory Visit: Payer: Self-pay | Admitting: Family

## 2023-07-19 DIAGNOSIS — E039 Hypothyroidism, unspecified: Secondary | ICD-10-CM

## 2023-12-06 ENCOUNTER — Telehealth: Payer: Self-pay | Admitting: Neurology

## 2023-12-06 ENCOUNTER — Encounter: Payer: Self-pay | Admitting: Family

## 2023-12-06 ENCOUNTER — Ambulatory Visit (INDEPENDENT_AMBULATORY_CARE_PROVIDER_SITE_OTHER): Payer: Medicare Other | Admitting: Family

## 2023-12-06 VITALS — BP 130/78 | HR 64 | Temp 97.2°F | Wt 139.8 lb

## 2023-12-06 DIAGNOSIS — M8000XA Age-related osteoporosis with current pathological fracture, unspecified site, initial encounter for fracture: Secondary | ICD-10-CM | POA: Diagnosis not present

## 2023-12-06 DIAGNOSIS — F02A4 Dementia in other diseases classified elsewhere, mild, with anxiety: Secondary | ICD-10-CM

## 2023-12-06 DIAGNOSIS — J4540 Moderate persistent asthma, uncomplicated: Secondary | ICD-10-CM | POA: Diagnosis not present

## 2023-12-06 DIAGNOSIS — E039 Hypothyroidism, unspecified: Secondary | ICD-10-CM

## 2023-12-06 DIAGNOSIS — E785 Hyperlipidemia, unspecified: Secondary | ICD-10-CM

## 2023-12-06 DIAGNOSIS — F411 Generalized anxiety disorder: Secondary | ICD-10-CM

## 2023-12-06 DIAGNOSIS — Z0001 Encounter for general adult medical examination with abnormal findings: Secondary | ICD-10-CM | POA: Diagnosis not present

## 2023-12-06 DIAGNOSIS — E559 Vitamin D deficiency, unspecified: Secondary | ICD-10-CM

## 2023-12-06 DIAGNOSIS — Z0000 Encounter for general adult medical examination without abnormal findings: Secondary | ICD-10-CM

## 2023-12-06 DIAGNOSIS — G3184 Mild cognitive impairment, so stated: Secondary | ICD-10-CM

## 2023-12-06 DIAGNOSIS — G300 Alzheimer's disease with early onset: Secondary | ICD-10-CM

## 2023-12-06 DIAGNOSIS — Z23 Encounter for immunization: Secondary | ICD-10-CM | POA: Diagnosis not present

## 2023-12-06 MED ORDER — ESCITALOPRAM OXALATE 20 MG PO TABS
20.0000 mg | ORAL_TABLET | Freq: Every day | ORAL | 2 refills | Status: DC
Start: 2023-12-06 — End: 2024-05-29

## 2023-12-06 NOTE — Telephone Encounter (Signed)
 Appt made

## 2023-12-06 NOTE — Patient Instructions (Signed)
 Dementia Dementia is a condition that affects the way the brain works. It often affects thinking and memory.  There are many types of dementia, including: Alzheimer's disease. This is the most common type. Vascular dementia. This type may happen due to a stroke. Lewy body dementia. This type may happen to people who have Parkinson's disease. Frontotemporal dementia. This type is caused by damage to nerve cells in certain parts of the brain. Some people may have more than one type. What are the causes? Dementia is caused by damage to cells in the brain. Some causes that can't be reversed include: Having a condition that affects the blood vessels of the brain. This may be diabetes or heart disease. Changes to genes. Some causes that can be reversed or slowed down include: Injury to the brain due to: A growth called a tumor. A blood clot. Too much fluid in the brain. Taking certain medicines. An infection. Problems with your thyroid. Not having enough vitamin B12 in the body. Having a disease that causes your body's defense system, called the immune system, to attack healthy parts of your body. What are the signs or symptoms? Symptoms of dementia start slowly and get worse with time. They may include: Problems remembering events or people. Getting lost easily. Forgetting appointments or to pay bills. Having trouble taking a bath or putting clothes on. Having trouble planning and making meals. Having trouble speaking. Changes in behavior or mood. How is this diagnosed? Dementia may be diagnosed based on: Your symptoms and medical history. A physical exam. Tests. These may include: Tests to check your thinking and memory to see how your brain is working. Lab tests. You may have tests on your blood or pee (urine). Imaging tests, such as a CT scan, a PET scan, or an MRI. Genetic testing. This may be done if other family members have had dementia. Your health care provider will talk  with you and your family, friends, or caregivers about your history and symptoms. How is this treated? Treatment depends on the cause of the dementia and should start as soon as possible. It might include: Taking medicines for symptoms. Taking medicines to help control or slow down the dementia. Treating the cause of your dementia. Your provider can help you find support groups and other members of the health care team who can help with your care. Follow these instructions at home: Medicines Take medicines only as told by your provider. Use a pill organizer or pill reminder to help you keep track of your medicines. Avoid taking medicines for pain or for sleep. These can affect your thinking. Lifestyle Make healthy choices. Be active as told by your provider. Do not smoke, vape, or use products with nicotine or tobacco in them. If you need help quitting, talk with your provider. Do not drink alcohol. When you feel a lot of stress, do something that helps you relax. Your provider can give you tips. Spend time with other people. Make sure you get good sleep at night. These tips can help: Try not to take naps during the day. Keep your bedroom dark and cool. Do not exercise in the few hours before you go to bed. Do not have foods or drinks with caffeine at night. Eating and drinking Drink enough fluid to keep your pee pale yellow. Eat a healthy diet. General instructions  Talk with your provider to decide on: What things you need help with. What your safety needs are. Ask your provider if it's safe for  you to drive. If told, wear a bracelet that tracks where you are or shows that you're a person with memory loss. Work with your family to make big legal or health decisions. This may include things like advance directives, medical power of attorney, or a living will. Where to find more information Alzheimer's Association: WesternTunes.it [employer] on Aging: BaseRingTones.pl World Health  Organization: VisitDestination.com.br Contact a health care provider if: You have any new symptoms. Your symptoms get worse. You have problems with swallowing. Get help right away if: You feel very sad or feel like you may hurt yourself or others. You have thoughts about taking your own life. Your family members are worried about your safety. These symptoms may be an emergency. Take one of these steps right away: Go to your nearest emergency room. Call 911. Call the National Suicide Prevention Lifeline at 4323758468 or 988. Text the Crisis Text Line at 780-444-5303. This information is not intended to replace advice given to you by your health care provider. Make sure you discuss any questions you have with your health care provider. Document Revised: 08/12/2023 Document Reviewed: 12/28/2022 Elsevier Patient Education  2024 [employer].

## 2023-12-06 NOTE — Progress Notes (Signed)
 Subjective:    Patient ID: [name], female    DOB: 1941-01-21, 83 y.o.   MRN: [id number]  No chief complaint on file.  Pt presents to the office today for CPE and  chronic follow up. She was followed by the Neurologists 6 months for memory changes and neurocognitive disorder.  However, since her son passed away she has not went.    She reports her son passed away and having a hard time right now. She is living alone, but family is very involved and bringing food and helping with her medications. Family states she seems very paranoid.   Asthma There is no cough, shortness of breath or wheezing. This is a chronic problem. The current episode started more than 1 year ago. The problem occurs intermittently. Her symptoms are alleviated by rest. She reports moderate improvement on treatment. Her past medical history is significant for asthma.  Thyroid  Problem Presents for follow-up visit. Symptoms include anxiety and fatigue. Patient reports no constipation, depressed mood or dry skin. The symptoms have been stable.  Anxiety Presents for follow-up visit. Symptoms include excessive worry and nervous/anxious behavior. Patient reports no depressed mood, irritability or shortness of breath. Symptoms occur occasionally. The severity of symptoms is mild.   Her past medical history is significant for asthma.  Hyperlipidemia This is a chronic problem. The current episode started more than 1 year ago. Exacerbating diseases include obesity. Pertinent negatives include no shortness of breath. Current antihyperlipidemic treatment includes diet change. The current treatment provides mild improvement of lipids. Risk factors for coronary artery disease include dyslipidemia, hypertension, a sedentary lifestyle and post-menopausal.      Review of Systems  Constitutional:  Positive for fatigue. Negative for irritability.  Respiratory:  Negative for cough, shortness of breath and wheezing.    Gastrointestinal:  Negative for constipation.  Psychiatric/Behavioral:  The patient is nervous/anxious.   All other systems reviewed and are negative.  Family History  Problem Relation Age of Onset   Heart failure Mother    Emphysema Mother    Other Father        blood clot in brain   Breast cancer Sister    Cancer Sister        breast   Memory loss Sister    Breast cancer Sister    Cancer Sister        breast   Asthma Sister    Cancer Brother        lung   Memory loss Sister    Anesthesia problems Neg Hx    Hypotension Neg Hx    Malignant hyperthermia Neg Hx    Pseudochol deficiency Neg Hx    Social History   Socioeconomic History   Marital status: Widowed    Spouse name: Not on file   Number of children: 3   Years of education: 37   Highest education level: Not on file  Occupational History   Occupation: retired Scientist, product/process development  Tobacco Use   Smoking status: Never   Smokeless tobacco: Never  Vaping Use   Vaping status: Never Used  Substance and Sexual Activity   Alcohol use: No   Drug use: No   Sexual activity: Not on file  Other Topics Concern   Not on file  Social History Narrative   Lives at home alone    Right handed   Caffeine: 3 cups/day coffee, coke    Social Drivers of Corporate investment banker Strain: Not on [employer]  Insecurity: Not on file  Transportation Needs: Not on file  Physical Activity: Not on file  Stress: Not on file  Social Connections: Not on file       Objective:   Physical Exam Vitals reviewed.  Constitutional:      General: She is not in acute distress.    Appearance: She is well-developed.  HENT:     Head: Normocephalic and atraumatic.     Right Ear: There is impacted cerumen.     Left Ear: Tympanic membrane normal.  Eyes:     Pupils: Pupils are equal, round, and reactive to light.  Neck:     Thyroid : No thyromegaly.  Cardiovascular:     Rate and Rhythm: Normal rate and regular rhythm.     Heart sounds:  Normal heart sounds. No murmur heard. Pulmonary:     Effort: Pulmonary effort is normal. No respiratory distress.     Breath sounds: Normal breath sounds. No wheezing.  Abdominal:     General: Bowel sounds are normal. There is no distension.     Palpations: Abdomen is soft.     Tenderness: There is no abdominal tenderness.  Musculoskeletal:        General: No tenderness. Normal range of motion.     Cervical back: Normal range of motion and neck supple.  Skin:    General: Skin is warm and dry.  Neurological:     Mental Status: She is alert and oriented to person, place, and time.     Cranial Nerves: No cranial nerve deficit.     Deep Tendon Reflexes: Reflexes are normal and symmetric.  Psychiatric:        Behavior: Behavior normal.        Thought Content: Thought content normal.        Cognition and Memory: Cognition is impaired. Memory is impaired.        Judgment: Judgment normal.      BP 130/78   Pulse 64   Temp (!) 97.2 F (36.2 C)   Wt 139 lb 12.8 oz (63.4 kg)   SpO2 95%   BMI 26.41 kg/m       Assessment & Plan:  [name] comes in today with chief complaint of No chief complaint on file.   Diagnosis and orders addressed:  1. Annual physical exam (Primary) - CMP14+EGFR - CBC with Differential/Platelet - Lipid panel - TSH - VITAMIN D  25 Hydroxy (Vit-D Deficiency, Fractures)  2. GAD (generalized anxiety disorder) - escitalopram  (LEXAPRO ) 20 MG tablet; Take 1 tablet (20 mg total) by mouth daily.  Dispense: 90 tablet; Refill: 2 - CMP14+EGFR - CBC with Differential/Platelet  3. Hyperlipidemia, unspecified hyperlipidemia type - CMP14+EGFR - CBC with Differential/Platelet - Lipid panel  4. Hypothyroidism, unspecified type - CMP14+EGFR - CBC with Differential/Platelet - TSH  5. Encounter for immunization - Flu Vaccine Trivalent High Dose (Fluad) - CMP14+EGFR - CBC with Differential/Platelet  6. 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  Dispense: 90 tablet; Refill: 2 - CMP14+EGFR - CBC with Differential/Platelet  7. Mild neurocognitive disorder - CMP14+EGFR - CBC with Differential/Platelet  8. Vitamin D  deficiency  - CMP14+EGFR - CBC with Differential/Platelet - VITAMIN D  25 Hydroxy (Vit-D Deficiency, Fractures)  9. 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 Moderate persistent asthma in adult without complication - CMP14+EGFR - CBC with  Differential/Platelet   Labs pending Will increase Lexapro  to 20 mg from 10 mg  Stress management  Health Maintenance reviewed Diet and exercise encouraged  Follow up plan: 4 months    Tommas Fragmin, FNP

## 2023-12-07 LAB — CBC WITH DIFFERENTIAL/PLATELET
Basophils Absolute: 0 10*3/uL (ref 0.0–0.2)
Basos: 1 %
EOS (ABSOLUTE): 0.2 10*3/uL (ref 0.0–0.4)
Eos: 4 %
Hematocrit: 38.4 % (ref 34.0–46.6)
Hemoglobin: 12.9 g/dL (ref 11.1–15.9)
Immature Grans (Abs): 0 10*3/uL (ref 0.0–0.1)
Immature Granulocytes: 0 %
Lymphocytes Absolute: 1.6 10*3/uL (ref 0.7–3.1)
Lymphs: 28 %
MCH: 31.7 pg (ref 26.6–33.0)
MCHC: 33.6 g/dL (ref 31.5–35.7)
MCV: 94 fL (ref 79–97)
Monocytes Absolute: 0.5 10*3/uL (ref 0.1–0.9)
Monocytes: 10 %
Neutrophils Absolute: 3.2 10*3/uL (ref 1.4–7.0)
Neutrophils: 57 %
Platelets: 232 10*3/uL (ref 150–450)
RBC: 4.07 x10E6/uL (ref 3.77–5.28)
RDW: 12.8 % (ref 11.7–15.4)
WBC: 5.5 10*3/uL (ref 3.4–10.8)

## 2023-12-07 LAB — CMP14+EGFR
ALT: 12 [IU]/L (ref 0–32)
AST: 17 [IU]/L (ref 0–40)
Albumin: 4.5 g/dL (ref 3.7–4.7)
Alkaline Phosphatase: 60 [IU]/L (ref 44–121)
BUN/Creatinine Ratio: 23 (ref 12–28)
BUN: 17 mg/dL (ref 8–27)
Bilirubin Total: 0.5 mg/dL (ref 0.0–1.2)
CO2: 23 mmol/L (ref 20–29)
Calcium: 9.8 mg/dL (ref 8.7–10.3)
Chloride: 104 mmol/L (ref 96–106)
Creatinine, Ser: 0.75 mg/dL (ref 0.57–1.00)
Globulin, Total: 2.5 g/dL (ref 1.5–4.5)
Glucose: 90 mg/dL (ref 70–99)
Potassium: 4.2 mmol/L (ref 3.5–5.2)
Sodium: 139 mmol/L (ref 134–144)
Total Protein: 7 g/dL (ref 6.0–8.5)
eGFR: 80 mL/min/{1.73_m2} (ref >59)

## 2023-12-07 LAB — LIPID PANEL
Chol/HDL Ratio: 3.7 {ratio} (ref 0.0–4.4)
Cholesterol, Total: 179 mg/dL (ref 100–199)
HDL: 48 mg/dL (ref >39)
LDL Chol Calc (NIH): 115 mg/dL — ABNORMAL HIGH (ref 0–99)
Triglycerides: 88 mg/dL (ref 0–149)
VLDL Cholesterol Cal: 16 mg/dL (ref 5–40)

## 2023-12-07 LAB — VITAMIN D 25 HYDROXY (VIT D DEFICIENCY, FRACTURES): Vit D, 25-Hydroxy: 31 ng/mL (ref 30.0–100.0)

## 2023-12-07 LAB — TSH: TSH: 1.87 u[IU]/mL (ref 0.450–4.500)

## 2024-02-21 ENCOUNTER — Telehealth: Payer: Self-pay | Admitting: Neurology

## 2024-02-21 NOTE — Telephone Encounter (Signed)
 LVM and sent mychart msg informing pt of need to reschedule 05/09/24 appt - MD out   If patient calls back to reschedule you can offer an appointment at the end of August with Dr. Tresia Fruit

## 2024-03-07 ENCOUNTER — Telehealth: Payer: Self-pay | Admitting: Neurology

## 2024-03-07 NOTE — Telephone Encounter (Signed)
 Pt sister Jerryl Morin) called to schedule Pt appt . Appt Scheduled  / Added to [facility]

## 2024-03-13 DIAGNOSIS — Z1231 Encounter for screening mammogram for malignant neoplasm of breast: Secondary | ICD-10-CM | POA: Diagnosis not present

## 2024-03-13 LAB — HM MAMMOGRAPHY

## 2024-03-14 ENCOUNTER — Encounter: Payer: Self-pay | Admitting: Family

## 2024-04-30 ENCOUNTER — Other Ambulatory Visit: Payer: Self-pay | Admitting: Family

## 2024-04-30 DIAGNOSIS — E039 Hypothyroidism, unspecified: Secondary | ICD-10-CM

## 2024-05-09 ENCOUNTER — Ambulatory Visit: Payer: Medicare Other | Admitting: Neurology

## 2024-05-11 ENCOUNTER — Other Ambulatory Visit: Payer: Self-pay | Admitting: Family

## 2024-05-18 ENCOUNTER — Telehealth: Payer: Self-pay | Admitting: Family Medicine

## 2024-05-18 NOTE — Telephone Encounter (Signed)
 Copied from CRM [id number]. Topic: Appointments - Scheduling Inquiry for Clinic >> May 18, 2024  8:30 AM Myrick T wrote: Reason for CRM: Santana Molt called to see if patients appt scheduled for 8/18 could be moved up sooner as they are putting patient in an assistant living facility on 8/4 and paperwork needs to be done before that date. Please call Santana at [phone]

## 2024-05-18 NOTE — Telephone Encounter (Signed)
 Called and spoke with [name] she is aware [facility] is out of town until the fourth appt made for that day. They are requesting a letter stating she has short term memory loss and dementia that is getting worse, but patient is still able to care for her self like bathing and eating. But is not safe to live by her self with out someone there full time. FYI

## 2024-05-22 ENCOUNTER — Telehealth: Payer: Self-pay | Admitting: Family

## 2024-05-22 ENCOUNTER — Ambulatory Visit

## 2024-05-22 ENCOUNTER — Other Ambulatory Visit

## 2024-05-22 DIAGNOSIS — Z111 Encounter for screening for respiratory tuberculosis: Secondary | ICD-10-CM

## 2024-05-22 NOTE — Telephone Encounter (Signed)
 [place] [facility] and [facility] form

## 2024-05-25 ENCOUNTER — Ambulatory Visit: Payer: Self-pay | Admitting: Nurse Practitioner

## 2024-05-25 LAB — QUANTIFERON-TB GOLD PLUS
QuantiFERON Mitogen Value: >10 [IU]/mL
QuantiFERON Nil Value: 0.05 [IU]/mL
QuantiFERON TB1 Ag Value: 0.07 [IU]/mL
QuantiFERON TB2 Ag Value: 0.09 [IU]/mL

## 2024-05-26 NOTE — Telephone Encounter (Signed)
Will be addressed at appointment

## 2024-05-29 ENCOUNTER — Encounter: Payer: Self-pay | Admitting: Family

## 2024-05-29 ENCOUNTER — Ambulatory Visit (INDEPENDENT_AMBULATORY_CARE_PROVIDER_SITE_OTHER): Admitting: Family

## 2024-05-29 DIAGNOSIS — F411 Generalized anxiety disorder: Secondary | ICD-10-CM | POA: Diagnosis not present

## 2024-05-29 DIAGNOSIS — G300 Alzheimer's disease with early onset: Secondary | ICD-10-CM

## 2024-05-29 DIAGNOSIS — E039 Hypothyroidism, unspecified: Secondary | ICD-10-CM

## 2024-05-29 DIAGNOSIS — F02B4 Dementia in other diseases classified elsewhere, moderate, with anxiety: Secondary | ICD-10-CM

## 2024-05-29 MED ORDER — ESCITALOPRAM OXALATE 20 MG PO TABS: 20.0000 mg | ORAL_TABLET | Freq: Every day | ORAL | 2 refills | Status: AC

## 2024-05-29 MED ORDER — LEVOTHYROXINE SODIUM 88 MCG PO TABS: 88.0000 ug | ORAL_TABLET | Freq: Every day | ORAL | 4 refills | Status: DC

## 2024-05-29 NOTE — Patient Instructions (Signed)
 Dementia Dementia is a condition that affects the way the brain works. It often affects thinking and memory.  There are many types of dementia, including: Alzheimer's disease. This is the most common type. Vascular dementia. This type may happen due to a stroke. Lewy body dementia. This type may happen to people who have Parkinson's disease. Frontotemporal dementia. This type is caused by damage to nerve cells in certain parts of the brain. Some people may have more than one type. What are the causes? Dementia is caused by damage to cells in the brain. Some causes that can't be reversed include: Having a condition that affects the blood vessels of the brain. This may be diabetes or heart disease. Changes to genes. Some causes that can be reversed or slowed down include: Injury to the brain due to: A growth called a tumor. A blood clot. Too much fluid in the brain. Taking certain medicines. An infection. Problems with your thyroid. Not having enough vitamin B12 in the body. Having a disease that causes your body's defense system, called the immune system, to attack healthy parts of your body. What are the signs or symptoms? Symptoms of dementia start slowly and get worse with time. They may include: Problems remembering events or people. Getting lost easily. Forgetting appointments or to pay bills. Having trouble taking a bath or putting clothes on. Having trouble planning and making meals. Having trouble speaking. Changes in behavior or mood. How is this diagnosed? Dementia may be diagnosed based on: Your symptoms and medical history. A physical exam. Tests. These may include: Tests to check your thinking and memory to see how your brain is working. Lab tests. You may have tests on your blood or pee (urine). Imaging tests, such as a CT scan, a PET scan, or an MRI. Genetic testing. This may be done if other family members have had dementia. Your health care provider will talk  with you and your family, friends, or caregivers about your history and symptoms. How is this treated? Treatment depends on the cause of the dementia and should start as soon as possible. It might include: Taking medicines for symptoms. Taking medicines to help control or slow down the dementia. Treating the cause of your dementia. Your provider can help you find support groups and other members of the health care team who can help with your care. Follow these instructions at home: Medicines Take medicines only as told by your provider. Use a pill organizer or pill reminder to help you keep track of your medicines. Avoid taking medicines for pain or for sleep. These can affect your thinking. Lifestyle Make healthy choices. Be active as told by your provider. Do not smoke, vape, or use products with nicotine or tobacco in them. If you need help quitting, talk with your provider. Do not drink alcohol. When you feel a lot of stress, do something that helps you relax. Your provider can give you tips. Spend time with other people. Make sure you get good sleep at night. These tips can help: Try not to take naps during the day. Keep your bedroom dark and cool. Do not exercise in the few hours before you go to bed. Do not have foods or drinks with caffeine at night. Eating and drinking Drink enough fluid to keep your pee pale yellow. Eat a healthy diet. General instructions  Talk with your provider to decide on: What things you need help with. What your safety needs are. Ask your provider if it's safe for  you to drive. If told, wear a bracelet that tracks where you are or shows that you're a person with memory loss. Work with your family to make big legal or health decisions. This may include things like advance directives, medical power of attorney, or a living will. Where to find more information Alzheimer's Association: WesternTunes.it [employer] on Aging: BaseRingTones.pl World Health  Organization: VisitDestination.com.br Contact a health care provider if: You have any new symptoms. Your symptoms get worse. You have problems with swallowing. Get help right away if: You feel very sad or feel like you may hurt yourself or others. You have thoughts about taking your own life. Your family members are worried about your safety. These symptoms may be an emergency. Take one of these steps right away: Go to your nearest emergency room. Call 911. Call the National Suicide Prevention Lifeline at 4323758468 or 988. Text the Crisis Text Line at 780-444-5303. This information is not intended to replace advice given to you by your health care provider. Make sure you discuss any questions you have with your health care provider. Document Revised: 08/12/2023 Document Reviewed: 12/28/2022 Elsevier Patient Education  2024 [employer].

## 2024-05-29 NOTE — Progress Notes (Signed)
 Subjective:    Patient ID: [name], female    DOB: 01-Jan-1941, 83 y.o.   MRN: [id number]  Chief Complaint  Patient presents with   fl2 assisted living forms   PT presents to the office today to have FL2 forms completed. She is currently living by herself, but family is coming to bring her lunch and dinner. Family washes all clothes and cleans her house. She only has a tub at home and they are worried about her falling.   She has dementia and becoming more forgetful. She is able to dress and feed herself.   Her son passed away last year and having hard time with this. She can become paranoid and will call her family at 3 AM worried people are in her home.  Thyroid  Problem Presents for follow-up visit. Symptoms include anxiety and fatigue. Patient reports no constipation or diarrhea. The symptoms have been stable.       Review of Systems  Constitutional:  Positive for fatigue.  Gastrointestinal:  Negative for constipation and diarrhea.  Psychiatric/Behavioral:  The patient is nervous/anxious.   All other systems reviewed and are negative.   Social History   Socioeconomic History   Marital status: Widowed    Spouse name: Not on file   Number of children: 3   Years of education: 72   Highest education level: Not on file  Occupational History   Occupation: retired Scientist, product/process development  Tobacco Use   Smoking status: Never   Smokeless tobacco: Never  Vaping Use   Vaping status: Never Used  Substance and Sexual Activity   Alcohol use: No   Drug use: No   Sexual activity: Not on file  Other Topics Concern   Not on file  Social History Narrative   Lives at home alone    Right handed   Caffeine: 3 cups/day coffee, coke    Social Drivers of Corporate investment banker Strain: Not on file  Food Insecurity: No Food Insecurity (05/29/2024)   Hunger Vital Sign    Worried About Running Out of Food in the Last Year: Never true    Ran Out of Food in the Last Year: Never true   Transportation Needs: No Transportation Needs (05/29/2024)   PRAPARE - Administrator, Civil Service (Medical): No    Lack of Transportation (Non-Medical): No  Physical Activity: Not on file  Stress: Not on file  Social Connections: Not on file   Family History  Problem Relation Age of Onset   Heart failure Mother    Emphysema Mother    Other Father        blood clot in brain   Breast cancer Sister    Cancer Sister        breast   Memory loss Sister    Breast cancer Sister    Cancer Sister        breast   Asthma Sister    Cancer Brother        lung   Memory loss Sister    Anesthesia problems Neg Hx    Hypotension Neg Hx    Malignant hyperthermia Neg Hx    Pseudochol deficiency Neg Hx         Objective:   Physical Exam Vitals reviewed.  Constitutional:      General: She is not in acute distress.    Appearance: She is well-developed.  HENT:     Head: Normocephalic and atraumatic.  Eyes:  Pupils: Pupils are equal, round, and reactive to light.  Neck:     Thyroid : No thyromegaly.  Cardiovascular:     Rate and Rhythm: Normal rate and regular rhythm.     Heart sounds: Normal heart sounds. No murmur heard. Pulmonary:     Effort: Pulmonary effort is normal. No respiratory distress.     Breath sounds: Normal breath sounds. No wheezing.  Abdominal:     General: Bowel sounds are normal. There is no distension.     Palpations: Abdomen is soft.     Tenderness: There is no abdominal tenderness.  Musculoskeletal:        General: No tenderness. Normal range of motion.     Cervical back: Normal range of motion and neck supple.  Skin:    General: Skin is warm and dry.  Neurological:     Mental Status: She is alert and oriented to person, place, and time.     Cranial Nerves: No cranial nerve deficit.     Deep Tendon Reflexes: Reflexes are normal and symmetric.  Psychiatric:        Behavior: Behavior normal.        Thought Content: Thought content normal.         Cognition and Memory: Cognition is impaired. Memory is impaired. She exhibits impaired recent memory and impaired remote memory.        Judgment: Judgment normal.       BP 135/80   Pulse 66   Temp 98.3 F (36.8 C)   Ht 5' 1 (1.549 m)   Wt 143 lb (64.9 kg)   SpO2 96%   BMI 27.02 kg/m      Assessment & Plan:  [name] comes in today with chief complaint of fl2 assisted living forms   Diagnosis and orders addressed:  1. Hypothyroidism, unspecified type - levothyroxine  (SYNTHROID ) 88 MCG tablet; Take 1 tablet (88 mcg total) by mouth daily before breakfast.  Dispense: 90 tablet; Refill: 4  2. GAD (generalized anxiety disorder) - escitalopram  (LEXAPRO ) 20 MG tablet; Take 1 tablet (20 mg total) by mouth daily.  Dispense: 90 tablet; Refill: 2  3. 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  Dispense: 90 tablet; Refill: 2   Continue current medications  Memory strategies Medications renewed Paper work completed and will fax    Return if symptoms worsen or fail to improve.    Bari Learn, FNP

## 2024-06-02 ENCOUNTER — Telehealth: Payer: Self-pay | Admitting: Family

## 2024-06-02 NOTE — Telephone Encounter (Signed)
 Copied from CRM [id number]. Topic: General - Call Back - No Documentation >> Jun 02, 2024 11:19 AM Avram MATSU wrote: Reason for CRM: pt is going to assisted living and the pt was seen by the provider one Monday. Santana Molt is calling to check to see if the forms are ready, she was told everything should be completed by the end of the week. Pt is scheduled to move in on Monday and need those forms. Please call to advise, [phone]   ----------------------------------------------------------------------- From previous Reason for Contact - Other: Reason for CRM:

## 2024-06-02 NOTE — Telephone Encounter (Signed)
 Donny, can you advise on if FL2 has been completed for patient?

## 2024-06-02 NOTE — Telephone Encounter (Signed)
 [name] came and picked up a copy of the FL2, all other paperwork was faxed to [facility] on Monday and has been sent to the scan center this morning. The [facility] paperwork is still on the PCP's desk.

## 2024-06-05 NOTE — Telephone Encounter (Signed)
 Pt does not need appointment for the 18th.   [facility] forms are ready for pick up

## 2024-06-05 NOTE — Telephone Encounter (Signed)
 Patient aware and verbalized understanding.

## 2024-06-05 NOTE — Telephone Encounter (Unsigned)
 Copied from CRM #[id number]. Topic: Appointments - Appointment Cancel/Reschedule >> Jun 05, 2024  9:09 AM Carmell SAUNDERS wrote: Santana is calling to cancel an appointment. Refer to attachments for appointment information. Please confirm if the patient still needs the appointment on the 18th. She did come on the 4th and completed a check up and blood work for the [facility] forms.  Also Santana informed that the [place] forms that were dropped off are needed today. The facility where the patient is going will not assist if they do not have the forms in hand. Please contact Santana asap at [phone]

## 2024-06-06 ENCOUNTER — Telehealth: Payer: Self-pay

## 2024-06-06 NOTE — Telephone Encounter (Signed)
 Copied from CRM [id number]. Topic: Clinical - Medication Question >> Jun 06, 2024  1:06 PM Antwanette L wrote: Reason for CRM: [name] (pt daughter in law) is calling b/c she needs Bari Jacob to send over a fax to [facility] [phone] [name], [place] [address]). The fax needs to state its ok for the pt to take her levothyroxine  (SYNTHROID ) 88 MCG tablet at night instead of at breakfast. Please send the fax today. [name] can be contacted at [phone] >> Jun 06, 2024  1:10 PM Antwanette L wrote: Gabrielle didn't have their fax number but their phone number is [phone]

## 2024-06-08 NOTE — Telephone Encounter (Signed)
 Please verify that patient has been taking this way. If she has already, ok for note.

## 2024-06-08 NOTE — Telephone Encounter (Signed)
 Santana states that is write will send over

## 2024-06-12 ENCOUNTER — Ambulatory Visit: Payer: Medicare Other | Admitting: Family

## 2024-07-20 ENCOUNTER — Other Ambulatory Visit: Payer: Self-pay | Admitting: Family

## 2024-07-20 DIAGNOSIS — R399 Unspecified symptoms and signs involving the genitourinary system: Secondary | ICD-10-CM

## 2024-07-20 MED ORDER — CIPROFLOXACIN HCL 500 MG PO TABS: 500.0000 mg | ORAL_TABLET | Freq: Two times a day (BID) | ORAL | 0 refills | Status: DC

## 2024-07-20 NOTE — Progress Notes (Signed)
 FAMILY AWARE WAS SENT TO [facility] THEY WILL PICK UP

## 2024-07-20 NOTE — Progress Notes (Signed)
 Cipro  Prescription sent to pharmacy. Reviewed urine culture results from [facility]. Make sure to force fluids. Let us  know if symptoms worsen or do not improve.

## 2024-07-31 ENCOUNTER — Ambulatory Visit: Admitting: Neurology

## 2024-09-11 ENCOUNTER — Ambulatory Visit (INDEPENDENT_AMBULATORY_CARE_PROVIDER_SITE_OTHER): Payer: Self-pay | Admitting: Family

## 2024-09-11 ENCOUNTER — Encounter: Payer: Self-pay | Admitting: Family

## 2024-09-11 VITALS — BP 135/77 | HR 96 | Temp 97.2°F | Ht 61.0 in | Wt 148.4 lb

## 2024-09-11 DIAGNOSIS — G300 Alzheimer's disease with early onset: Secondary | ICD-10-CM

## 2024-09-11 DIAGNOSIS — F411 Generalized anxiety disorder: Secondary | ICD-10-CM

## 2024-09-11 DIAGNOSIS — E039 Hypothyroidism, unspecified: Secondary | ICD-10-CM | POA: Diagnosis not present

## 2024-09-11 DIAGNOSIS — Z23 Encounter for immunization: Secondary | ICD-10-CM

## 2024-09-11 DIAGNOSIS — M810 Age-related osteoporosis without current pathological fracture: Secondary | ICD-10-CM

## 2024-09-11 DIAGNOSIS — E559 Vitamin D deficiency, unspecified: Secondary | ICD-10-CM

## 2024-09-11 DIAGNOSIS — M8000XA Age-related osteoporosis with current pathological fracture, unspecified site, initial encounter for fracture: Secondary | ICD-10-CM

## 2024-09-11 DIAGNOSIS — E785 Hyperlipidemia, unspecified: Secondary | ICD-10-CM | POA: Diagnosis not present

## 2024-09-11 DIAGNOSIS — J4540 Moderate persistent asthma, uncomplicated: Secondary | ICD-10-CM

## 2024-09-11 DIAGNOSIS — F02A4 Dementia in other diseases classified elsewhere, mild, with anxiety: Secondary | ICD-10-CM

## 2024-09-11 MED ORDER — BUSPIRONE HCL 5 MG PO TABS: 5.0000 mg | ORAL_TABLET | Freq: Two times a day (BID) | ORAL | 2 refills | Status: DC

## 2024-09-11 NOTE — Patient Instructions (Signed)

## 2024-09-11 NOTE — Progress Notes (Signed)
 Subjective:    Patient ID: [name], female    DOB: 1941/06/21, 83 y.o.   MRN: [id number]  Chief Complaint  Patient presents with   Medical Management of Chronic Issues   Pt presents to the office today for chronic follow up.    She has dementia and now staying at [facility]. Doing well with this transition.  Asthma There is no cough, shortness of breath or wheezing. This is a chronic problem. The current episode started more than 1 year ago. The problem occurs intermittently. Her symptoms are alleviated by rest. She reports moderate improvement on treatment. Her past medical history is significant for asthma.  Thyroid  Problem Presents for follow-up visit. Symptoms include anxiety and fatigue. Patient reports no constipation, depressed mood or dry skin. The symptoms have been stable.  Anxiety Presents for follow-up visit. Symptoms include excessive worry and nervous/anxious behavior. Patient reports no depressed mood, irritability or shortness of breath. Symptoms occur constantly. The severity of symptoms is mild.   Her past medical history is significant for asthma.  Hyperlipidemia This is a chronic problem. The current episode started more than 1 year ago. The problem is uncontrolled. Recent lipid tests were reviewed and are high. Exacerbating diseases include obesity. Pertinent negatives include no shortness of breath. Current antihyperlipidemic treatment includes diet change. The current treatment provides mild improvement of lipids. Risk factors for coronary artery disease include dyslipidemia, hypertension, a sedentary lifestyle and post-menopausal.      Review of Systems  Constitutional:  Positive for fatigue. Negative for irritability.  Respiratory:  Negative for cough, shortness of breath and wheezing.   Gastrointestinal:  Negative for constipation.  Psychiatric/Behavioral:  The patient is nervous/anxious.   All other systems reviewed and are negative.  Family  History  Problem Relation Age of Onset   Heart failure Mother    Emphysema Mother    Other Father        blood clot in brain   Breast cancer Sister    Cancer Sister        breast   Memory loss Sister    Breast cancer Sister    Cancer Sister        breast   Asthma Sister    Cancer Brother        lung   Memory loss Sister    Anesthesia problems Neg Hx    Hypotension Neg Hx    Malignant hyperthermia Neg Hx    Pseudochol deficiency Neg Hx    Social History   Socioeconomic History   Marital status: Widowed    Spouse name: Not on file   Number of children: 3   Years of education: 100   Highest education level: Not on file  Occupational History   Occupation: retired scientist, product/process development  Tobacco Use   Smoking status: Never   Smokeless tobacco: Never  Vaping Use   Vaping status: Never Used  Substance and Sexual Activity   Alcohol use: No   Drug use: No   Sexual activity: Not on file  Other Topics Concern   Not on file  Social History Narrative   Lives at home alone    Right handed   Caffeine: 3 cups/day coffee, coke    Social Drivers of Corporate Investment Banker Strain: Not on file  Food Insecurity: No Food Insecurity (05/29/2024)   Hunger Vital Sign    Worried About Running Out of Food in the Last Year: Never true    Ran Out  of Food in the Last Year: Never true  Transportation Needs: No Transportation Needs (05/29/2024)   PRAPARE - Administrator, Civil Service (Medical): No    Lack of Transportation (Non-Medical): No  Physical Activity: Not on file  Stress: Not on file  Social Connections: Not on file       Objective:   Physical Exam Vitals reviewed.  Constitutional:      General: She is not in acute distress.    Appearance: She is well-developed.  HENT:     Head: Normocephalic and atraumatic.     Right Ear: Tympanic membrane normal.     Left Ear: Tympanic membrane normal.  Eyes:     Pupils: Pupils are equal, round, and reactive to light.   Neck:     Thyroid : No thyromegaly.  Cardiovascular:     Rate and Rhythm: Normal rate and regular rhythm.     Heart sounds: Normal heart sounds. No murmur heard. Pulmonary:     Effort: Pulmonary effort is normal. No respiratory distress.     Breath sounds: Normal breath sounds. No wheezing.  Abdominal:     General: Bowel sounds are normal. There is no distension.     Palpations: Abdomen is soft.     Tenderness: There is no abdominal tenderness.  Musculoskeletal:        General: No tenderness. Normal range of motion.     Cervical back: Normal range of motion and neck supple.  Skin:    General: Skin is warm and dry.  Neurological:     Mental Status: She is alert and oriented to person, place, and time.     Cranial Nerves: No cranial nerve deficit.     Deep Tendon Reflexes: Reflexes are normal and symmetric.  Psychiatric:        Behavior: Behavior normal.        Thought Content: Thought content normal.        Cognition and Memory: Cognition is impaired. Memory is impaired.        Judgment: Judgment normal.      BP 135/77   Pulse 96   Temp (!) 97.2 F (36.2 C) (Temporal)   Ht 5' 1 (1.549 m)   Wt 148 lb 6.4 oz (67.3 kg)   BMI 28.04 kg/m       Assessment & Plan:  [name] comes in today with chief complaint of Medical Management of Chronic Issues   Diagnosis and orders addressed:  1. Encounter for immunization (Primary) - Flu vaccine HIGH DOSE PF(Fluzone Trivalent)  2. 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 GAD (generalized anxiety disorder) Start Buspar 5 mg BID Continue Lexapro  20 mg  Stress management  - CMP14+EGFR - CBC with Differential/Platelet - busPIRone (BUSPAR) 5 MG tablet; Take 1 tablet (5 mg total) by mouth 2 (two) times daily.  Dispense: 180 tablet; Refill: 2  4. Hyperlipidemia, unspecified hyperlipidemia type - CMP14+EGFR - CBC with Differential/Platelet  5.  Hypothyroidism, unspecified type - CMP14+EGFR - CBC with Differential/Platelet - TSH  6. Moderate persistent asthma in adult without complication - CMP14+EGFR - CBC with Differential/Platelet  7. 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 Vitamin D  deficiency - CMP14+EGFR - CBC with Differential/Platelet   Labs pending Will add Buspar 5 mg BID  Stress management  Health Maintenance reviewed Diet and exercise encouraged  Follow up plan: 6 months  Bari Learn, FNP

## 2024-09-12 ENCOUNTER — Ambulatory Visit: Payer: Self-pay | Admitting: Family

## 2024-09-12 LAB — CMP14+EGFR
ALT: 10 IU/L (ref 0–32)
AST: 14 IU/L (ref 0–40)
Albumin: 4.5 g/dL (ref 3.7–4.7)
Alkaline Phosphatase: 61 IU/L (ref 48–129)
BUN/Creatinine Ratio: 16 (ref 12–28)
BUN: 14 mg/dL (ref 8–27)
Bilirubin Total: 0.4 mg/dL (ref 0.0–1.2)
CO2: 23 mmol/L (ref 20–29)
Calcium: 10.3 mg/dL (ref 8.7–10.3)
Chloride: 103 mmol/L (ref 96–106)
Creatinine, Ser: 0.85 mg/dL (ref 0.57–1.00)
Globulin, Total: 2.4 g/dL (ref 1.5–4.5)
Glucose: 94 mg/dL (ref 70–99)
Potassium: 4.1 mmol/L (ref 3.5–5.2)
Sodium: 142 mmol/L (ref 134–144)
Total Protein: 6.9 g/dL (ref 6.0–8.5)
eGFR: 68 mL/min/1.73 (ref >59)

## 2024-09-12 LAB — CBC WITH DIFFERENTIAL/PLATELET
Basophils Absolute: 0 x10E3/uL (ref 0.0–0.2)
Basos: 1 %
EOS (ABSOLUTE): 0.2 x10E3/uL (ref 0.0–0.4)
Eos: 2 %
Hematocrit: 39.9 % (ref 34.0–46.6)
Hemoglobin: 12.9 g/dL (ref 11.1–15.9)
Immature Grans (Abs): 0 x10E3/uL (ref 0.0–0.1)
Immature Granulocytes: 0 %
Lymphocytes Absolute: 1.6 x10E3/uL (ref 0.7–3.1)
Lymphs: 24 %
MCH: 31.1 pg (ref 26.6–33.0)
MCHC: 32.3 g/dL (ref 31.5–35.7)
MCV: 96 fL (ref 79–97)
Monocytes Absolute: 0.7 x10E3/uL (ref 0.1–0.9)
Monocytes: 10 %
Neutrophils Absolute: 4.1 x10E3/uL (ref 1.4–7.0)
Neutrophils: 63 %
Platelets: 245 x10E3/uL (ref 150–450)
RBC: 4.15 x10E6/uL (ref 3.77–5.28)
RDW: 13.4 % (ref 11.7–15.4)
WBC: 6.6 x10E3/uL (ref 3.4–10.8)

## 2024-09-12 LAB — TSH: TSH: 4.55 u[IU]/mL — AB (ref 0.450–4.500)

## 2024-10-11 ENCOUNTER — Ambulatory Visit

## 2024-10-11 ENCOUNTER — Encounter: Payer: Self-pay | Admitting: Family Medicine

## 2024-10-11 VITALS — BP 165/84 | HR 72 | Temp 97.7°F | Ht 61.0 in | Wt 150.4 lb

## 2024-10-11 DIAGNOSIS — F411 Generalized anxiety disorder: Secondary | ICD-10-CM

## 2024-10-11 DIAGNOSIS — F02B4 Dementia in other diseases classified elsewhere, moderate, with anxiety: Secondary | ICD-10-CM

## 2024-10-11 DIAGNOSIS — G300 Alzheimer's disease with early onset: Secondary | ICD-10-CM | POA: Diagnosis not present

## 2024-10-11 LAB — URINALYSIS, ROUTINE W REFLEX MICROSCOPIC
Bilirubin, UA: NEGATIVE
Glucose, UA: NEGATIVE
Ketones, UA: NEGATIVE
Nitrite, UA: NEGATIVE
Protein,UA: NEGATIVE
RBC, UA: NEGATIVE
Specific Gravity, UA: 1.015 (ref 1.005–1.030)
Urobilinogen, Ur: 0.2 mg/dL (ref 0.2–1.0)
pH, UA: 7 (ref 5.0–7.5)

## 2024-10-11 LAB — MICROSCOPIC EXAMINATION
Bacteria, UA: NONE SEEN
RBC, Urine: NONE SEEN /HPF (ref 0–2)
Renal Epithel, UA: NONE SEEN /HPF
Yeast, UA: NONE SEEN

## 2024-10-11 MED ORDER — BUSPIRONE HCL 7.5 MG PO TABS: 7.5000 mg | ORAL_TABLET | Freq: Two times a day (BID) | ORAL | 0 refills | Status: DC

## 2024-10-11 NOTE — Progress Notes (Signed)
 Acute Office Visit  Subjective:     Patient ID: [name], female    DOB: 1941/10/03, 83 y.o.   MRN: [id number]  Chief Complaint  Patient presents with   Agitation    HPI  History of Present Illness   [name] is an 83 year old female with Alzheimer's disease who presents with increased agitation and aggression. She is accompanied by her daughter-in-laws, who are her primary caregivers.  Neuropsychiatric symptoms - Increased agitation and aggression over the past month, atypical for her baseline - Aggressive behaviors include slapping a nurse and calling family members late at night - Change in sleep pattern: previously went to bed early, now staying up late - Symptoms tend to worsen in the evenings - Currently taking Buspar  for symptom management, but symptoms persist  Cognitive impairment - Diagnosis of Alzheimer's disease - Previous adverse reactions to Namenda and Aricept  - Last neurology consultation in 2023  Appetite and weight - Appetite remains good - Weight gain since last visit, which is positive given typical weight loss in advanced Alzheimer's disease  Social engagement - Continues to participate in social activities, including going out to eat and shopping with family weekly  Environmental factors - No significant changes in her environment  Urinary symptoms - No urinary symptoms despite caregiver concern for possible urinary tract infection       ROS As per HPI.      Objective:    BP (!) 165/84   Pulse 72   Temp 97.7 F (36.5 C) (Temporal)   Ht 5' 1 (1.549 m)   Wt 150 lb 6.4 oz (68.2 kg)   SpO2 98%   BMI 28.42 kg/m  Wt Readings from Last 3 Encounters:  10/11/24 150 lb 6.4 oz (68.2 kg)  09/11/24 148 lb 6.4 oz (67.3 kg)  05/29/24 143 lb (64.9 kg)      Physical Exam Vitals and nursing note reviewed.  Constitutional:      General: She is not in acute distress.    Appearance: She is not ill-appearing, toxic-appearing or  diaphoretic.  Cardiovascular:     Rate and Rhythm: Normal rate and regular rhythm.     Heart sounds: Normal heart sounds. No murmur heard. Pulmonary:     Effort: Pulmonary effort is normal. No respiratory distress.     Breath sounds: Normal breath sounds.  Musculoskeletal:     Right lower leg: No edema.     Left lower leg: No edema.  Skin:    General: Skin is warm and dry.  Neurological:     Mental Status: She is alert. Mental status is at baseline.  Psychiatric:        Attention and Perception: Attention normal.        Mood and Affect: Mood normal.        Speech: Speech normal.        Behavior: Behavior normal.        Cognition and Memory: Cognition is impaired. Memory is impaired.     No results found for any visits on 10/11/24.      Assessment & Plan:   [name] was seen today for agitation.  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  GAD (generalized anxiety disorder) -  busPIRone  (BUSPAR ) 7.5 MG tablet; Take 1 tablet (7.5 mg total) by mouth 2 (two) times daily.   Assessment and Plan    Alzheimer's disease with anxiety Increased agitation and aggression. No UTI. Adverse reactions to Namenda and Aricept .  - Increased Buspar  to 7.5 mg twice daily. - Sent prescription to pharmacy. - Provided Alzheimer's Association resources for coping strategies. - Encouraged evening activities to manage agitation.      Return in about 6 weeks (around 11/22/2024) for medication follow up with PCP.  The patient indicates understanding of these issues and agrees with the plan.  Annabella CHRISTELLA Search, FNP

## 2024-10-11 NOTE — Patient Instructions (Addendum)
 [email or web address]  Dementia Caregiver Guide Dementia is a condition that affects the way the brain works. It often affects thinking and memory. A person with dementia may: Forget things. Have trouble talking or responding to your questions. Have trouble paying attention. Have trouble thinking clearly and making good decisions. Get lost or wander away from home or other places. Have big changes in their mood or emotions. They may: Feel very worried, nervous, or depressed. Have angry outbursts. Be suspicious or accuse you of things. Have childlike behavior and language. Taking care of someone with dementia can be a challenge. The tips below can help you care for the person. How to help manage lifestyle changes Dementia usually gets worse slowly over time. In the early stages, people with dementia can stay safe and take care of themselves with some help. In later stages, they need help with daily tasks like getting dressed, grooming, and going to the bathroom. Communicating When the person is talking and seems frustrated, make eye contact and hold the person's hand. Ask questions that can be answered with a yes or no. Use simple words and a calm voice. Only give one direction at a time. Limit choices for the person. Too many choices can be stressful. Avoid correcting the person in a negative way. If the person can't find the right words, gently try to help. Preventing injury  Keep floors clear. Remove rugs, magazine racks, and floor lamps. Keep hallways well lit, especially at night. Put a handrail and nonslip mat in the bathtub or shower. Put childproof locks on cabinets that have dangerous items in them. These items include medicine, alcohol, guns, cleaning products, and sharp tools. For doors to the outside, put locks where the person can't see or reach them. This helps keep the person from going out of the house and getting lost. Be ready for emergencies. Keep a list of emergency  phone numbers and addresses close by. Remove car keys and lock garage doors so the person doesn't try to drive. Have the person wear a bracelet that tracks where they are and shows that they're a person with memory loss. This should be worn at all times for safety. Helping with daily life  Keep the person on track with their daily routine. Try to identify areas where the person may need help. Be supportive, patient, calm, and encouraging. Gently remind the person that adjusting to changes takes time. Help with the tasks that the person has asked for help with. Keep the person involved in daily tasks and decisions as much as you can. Encourage conversation, but try not to get frustrated if the person struggles to find words or doesn't seem to appreciate your help. Other tips Think about any safety risks and take steps to avoid them. Keep things organized: Organize medicines in a pill box for each day of the week. Keep a calendar in a central place. Use it to remind the person of health care visits or other activities. Create a plan to handle any legal or financial matters. Get help from a professional if needed. Help make sure the person: Takes medicines only as told by their health care providers. Eats regular, healthy meals. They should also drink plenty of fluids. Goes to all scheduled health care appointments. Gets regular sleep. Taking care of yourself Being a caregiver for someone with dementia can be hard. You may feel stressed and have many other emotions. It's important to also take care of yourself. Here are some tips: Find out about  services that can provide short-term care for the person. This is called respite care. It can allow you to take a break when you need one. Find healthy ways to deal with stress. Some ways include: Spending time with other people. Exercising. Meditating or doing deep breathing exercises. Take care of your own health by: Getting enough  sleep. Eating healthy foods. Getting regular exercise. Join a support group with others who are caregivers. These groups can help you: Learn other ways to deal with stress. Share experiences with others. Get emotional comfort and support. Learn about caregiving as the disease gets worse. Find resources in your community. Where to find support: Many people and organizations offer support. These include: Support groups for people with dementia. Support groups for caregivers. Counselors or therapists. Home health care services. Adult day care centers. Where to find more information Alzheimer's Association: westerntunes.it Family Caregiver Alliance: caregiver.org Alzheimer's Foundation of [employer]: alzfdn.org Contact a health care provider if: The person's health is quickly getting worse. You're no longer able to care for the person. Caring for the person is affecting your physical and emotional health. You're feeling worried, nervous, or depressed about caring for the person. Get help right away if: You feel like the person may hurt themselves or others. The person has talked about taking their own life. These symptoms may be an emergency. Take one of these steps right away: Go to your nearest emergency room. Call 911. Call the National Suicide Prevention Lifeline at 281-323-4273 or 988. Text the Crisis Text Line at 408 376 1790. This information is not intended to replace advice given to you by your health care provider. Make sure you discuss any questions you have with your health care provider. Document Revised: 01/22/2023 Document Reviewed: 01/22/2023 Elsevier Patient Education  2024 [employer].

## 2024-11-13 ENCOUNTER — Telehealth: Payer: Self-pay | Admitting: *Deleted

## 2024-11-13 NOTE — Telephone Encounter (Signed)
 FYI--  [employer] called on call service over the weekend reporting fall 11/12/2024. Pt was found on her knees and had no injury. Dr Butler Der was notified per RN.

## 2024-11-16 ENCOUNTER — Encounter (HOSPITAL_COMMUNITY): Payer: Self-pay | Admitting: Emergency Medicine

## 2024-11-16 ENCOUNTER — Telehealth: Payer: Self-pay

## 2024-11-16 ENCOUNTER — Other Ambulatory Visit: Payer: Self-pay

## 2024-11-16 ENCOUNTER — Emergency Department (HOSPITAL_COMMUNITY)

## 2024-11-16 ENCOUNTER — Emergency Department (HOSPITAL_COMMUNITY)
Admission: EM | Admit: 2024-11-16 | Discharge: 2024-11-16 | Disposition: A | Discharge status: Home / Self Care | Attending: Emergency Medicine | Admitting: Emergency Medicine

## 2024-11-16 DIAGNOSIS — S42294A Other nondisplaced fracture of upper end of right humerus, initial encounter for closed fracture: Secondary | ICD-10-CM | POA: Diagnosis not present

## 2024-11-16 DIAGNOSIS — F0390 Unspecified dementia without behavioral disturbance: Secondary | ICD-10-CM | POA: Insufficient documentation

## 2024-11-16 DIAGNOSIS — R1033 Periumbilical pain: Secondary | ICD-10-CM | POA: Insufficient documentation

## 2024-11-16 DIAGNOSIS — S0083XA Contusion of other part of head, initial encounter: Secondary | ICD-10-CM | POA: Diagnosis not present

## 2024-11-16 DIAGNOSIS — W19XXXA Unspecified fall, initial encounter: Secondary | ICD-10-CM | POA: Insufficient documentation

## 2024-11-16 DIAGNOSIS — Y92129 Unspecified place in nursing home as the place of occurrence of the external cause: Secondary | ICD-10-CM | POA: Insufficient documentation

## 2024-11-16 DIAGNOSIS — S022XXA Fracture of nasal bones, initial encounter for closed fracture: Secondary | ICD-10-CM | POA: Insufficient documentation

## 2024-11-16 DIAGNOSIS — D649 Anemia, unspecified: Secondary | ICD-10-CM | POA: Diagnosis not present

## 2024-11-16 DIAGNOSIS — J45909 Unspecified asthma, uncomplicated: Secondary | ICD-10-CM | POA: Diagnosis not present

## 2024-11-16 DIAGNOSIS — S4991XA Unspecified injury of right shoulder and upper arm, initial encounter: Secondary | ICD-10-CM | POA: Diagnosis present

## 2024-11-16 DIAGNOSIS — R739 Hyperglycemia, unspecified: Secondary | ICD-10-CM | POA: Insufficient documentation

## 2024-11-16 LAB — COMPREHENSIVE METABOLIC PANEL WITH GFR
ALT: 9 U/L (ref 0–44)
AST: 16 U/L (ref 15–41)
Albumin: 3.9 g/dL (ref 3.5–5.0)
Alkaline Phosphatase: 56 U/L (ref 38–126)
Anion gap: 13 (ref 5–15)
BUN: 13 mg/dL (ref 8–23)
CO2: 21 mmol/L — ABNORMAL LOW (ref 22–32)
Calcium: 9.4 mg/dL (ref 8.9–10.3)
Chloride: 104 mmol/L (ref 98–111)
Creatinine, Ser: 0.68 mg/dL (ref 0.44–1.00)
GFR, Estimated: >60 mL/min
Glucose, Bld: 138 mg/dL — ABNORMAL HIGH (ref 70–99)
Potassium: 3.7 mmol/L (ref 3.5–5.1)
Sodium: 138 mmol/L (ref 135–145)
Total Bilirubin: 0.5 mg/dL (ref 0.0–1.2)
Total Protein: 6.9 g/dL (ref 6.5–8.1)

## 2024-11-16 LAB — CBC WITH DIFFERENTIAL/PLATELET
Abs Immature Granulocytes: 0.06 K/uL (ref 0.00–0.07)
Basophils Absolute: 0 K/uL (ref 0.0–0.1)
Basophils Relative: 0 %
Eosinophils Absolute: 0.1 K/uL (ref 0.0–0.5)
Eosinophils Relative: 1 %
HCT: 34.8 % — ABNORMAL LOW (ref 36.0–46.0)
Hemoglobin: 11.4 g/dL — ABNORMAL LOW (ref 12.0–15.0)
Immature Granulocytes: 1 %
Lymphocytes Relative: 7 %
Lymphs Abs: 0.7 K/uL (ref 0.7–4.0)
MCH: 30.9 pg (ref 26.0–34.0)
MCHC: 32.8 g/dL (ref 30.0–36.0)
MCV: 94.3 fL (ref 80.0–100.0)
Monocytes Absolute: 0.8 K/uL (ref 0.1–1.0)
Monocytes Relative: 8 %
Neutro Abs: 9.5 K/uL — ABNORMAL HIGH (ref 1.7–7.7)
Neutrophils Relative %: 83 %
Platelets: 244 K/uL (ref 150–400)
RBC: 3.69 MIL/uL — ABNORMAL LOW (ref 3.87–5.11)
RDW: 13.6 % (ref 11.5–15.5)
WBC: 11.2 K/uL — ABNORMAL HIGH (ref 4.0–10.5)
nRBC: 0 % (ref 0.0–0.2)

## 2024-11-16 MED ORDER — IOHEXOL 300 MG/ML  SOLN
100.0000 mL | Freq: Once | INTRAMUSCULAR | Status: AC | PRN
  Administered 2024-11-16: 100 mL via INTRAVENOUS

## 2024-11-16 NOTE — ED Provider Notes (Signed)
 " [place] EMERGENCY DEPARTMENT AT [facility] Provider Note   CSN: [id number] Arrival date & time: 11/16/24  9690     Patient presents with: Fall   [name] is a 84 y.o. female.   The history is provided by the EMS personnel. The history is limited by the condition of the patient (Dementia).  Fall   She has history of hyperlipidemia, asthma, dementia, and comes in from a skilled nursing facility following an unwitnessed fall.  She suffered a forehead contusion and had a nosebleed and is complaining of pain in her right shoulder.    Prior to Admission medications  Medication Sig Start Date End Date Taking? Authorizing Provider  acetaminophen  (TYLENOL ) 500 MG tablet Take 500-1,000 mg by mouth as needed.    [provider]  busPIRone  (BUSPAR ) 7.5 MG tablet Take 1 tablet (7.5 mg total) by mouth 2 (two) times daily. 10/11/24   Joesph Annabella HERO, FNP  escitalopram  (LEXAPRO ) 20 MG tablet Take 1 tablet (20 mg total) by mouth daily. 05/29/24   Lavell Bari LABOR, FNP  levothyroxine  (SYNTHROID ) 88 MCG tablet Take 1 tablet (88 mcg total) by mouth daily before breakfast. 05/29/24   Lavell Bari LABOR, FNP    Allergies: Sulfonamide derivatives    Review of Systems  Unable to perform ROS: Dementia    Updated Vital Signs BP (!) 143/68   Pulse 68   Temp 98.5 F (36.9 C) (Oral)   Resp 18   Ht 5' 1 (1.549 m)   Wt 72.6 kg   SpO2 95%   BMI 30.23 kg/m   Physical Exam Vitals and nursing note reviewed.   84 year old female, resting comfortably and in no acute distress. Vital signs are significant for borderline elevated blood pressure. Oxygen  saturation is 95%, which is normal. Head is normocephalic.  There is a hematoma present on the left side of the forehead, very small laceration across the bridge of the nose, dried blood at the nares without active bleeding.  PERRLA, EOMI. no intraoral injury seen. Neck is nontender. Back is nontender. Lungs are clear without  rales, wheezes, or rhonchi. Chest is mildly tender over the lower sternal area and right lower anterior rib cage.  There is no crepitus. Heart has regular rate and rhythm without murmur. Abdomen is soft, flat, with mild periumbilical tenderness.  There is no rebound or guarding. Extremities: There is tenderness palpation over the right shoulder and pain on passive range of motion.  Full passive range of motion of all other joints without pain.  Right clavicle is nontender. Skin is warm and dry without rash. Neurologic: Awake and alert, oriented to person and partially oriented to place but not oriented to time.  Cranial nerves are intact, moves both legs and left arm equally, does not move right arm because of pain.    (all labs ordered are listed, but only abnormal results are displayed) Labs Reviewed  COMPREHENSIVE METABOLIC PANEL WITH GFR - Abnormal; Notable for the following components:      Result Value   CO2 21 (*)    Glucose, Bld 138 (*)    All other components within normal limits  CBC WITH DIFFERENTIAL/PLATELET - Abnormal; Notable for the following components:   WBC 11.2 (*)    RBC 3.69 (*)    Hemoglobin 11.4 (*)    HCT 34.8 (*)    Neutro Abs 9.5 (*)    All other components within normal limits    Radiology: CT CHEST  ABDOMEN PELVIS W CONTRAST Result Date: 11/16/2024 EXAM: CT CHEST, ABDOMEN AND PELVIS WITH CONTRAST 11/16/2024 04:43:42 AM TECHNIQUE: CT of the chest, abdomen and pelvis was performed with the administration of 100 mL of iohexol  (OMNIPAQUE ) 300 MG/ML solution. Multiplanar reformatted images are provided for review. Automated exposure control, iterative reconstruction, and/or weight based adjustment of the mA/kV was utilized to reduce the radiation dose to as low as reasonably achievable. COMPARISON: CT abdomen and pelvis 03/09/2018. CLINICAL HISTORY: 84 year old female who was found down on bathroom floor, unobserved fall. Pain. FINDINGS: CHEST: MEDIASTINUM AND LYMPH  NODES: Heart and pericardium are unremarkable. The central airways are clear. No mediastinal, hilar or axillary lymphadenopathy. Mild for age thoracic aortic atherosclerosis. LUNGS AND PLEURA: Sonic curvilinear bilateral lung bases scarring. Somewhat nodular lingula atelectasis does not appear significantly changed from 2019 (series 2 image 33 now versus series 3 image 1 previously). No pleural effusion or pulmonary contusion. No pneumothorax. ABDOMEN AND PELVIS: LIVER: Benign calcified granuloma of the right hepatic lobe. GALLBLADDER AND BILE DUCTS: Chronic cholecystectomy. No biliary ductal dilatation. SPLEEN: No acute abnormality. PANCREAS: No acute abnormality. ADRENAL GLANDS: No acute abnormality. KIDNEYS, URETERS AND BLADDER: Several small chronic, simple and benign renal cysts were present in 2019 (no follow up imaging recommended). Symmetric renal enhancement and contrast excretion. No stones in the kidneys or ureters. No hydronephrosis. No perinephric or periureteral stranding. Diminutive urinary bladder. GI AND BOWEL: Small gastric hiatal hernia at the diaphragm. Diverticulosis of the descending and sigmoid colon with no active inflammation identified. Decompressed large bowel from the distal transverse colon into the pelvis. Normal appendix on series 2 image 85. There is no bowel obstruction. REPRODUCTIVE ORGANS: No acute abnormality. PERITONEUM AND RETROPERITONEUM: Small fat containing right paraumbilical hernia on series 2 image 79. No herniated bowel or associated inflammation. No ascites. No free air. VASCULATURE: Aorta is normal in caliber. Moderate aortoiliac atherosclerosis with calcified plaque. Major arterial structures and portal venous system in the abdomen and pelvis appear patent. ABDOMINAL AND PELVIS LYMPH NODES: No lymphadenopathy. BONES AND SOFT TISSUES: Comminuted fracture proximal right humerus (series 5 images 7 through 5), with long segment longitudinal fracture component anteriorly.  Comminution of the head, and subluxation if not frank dislocation of the humeral head from the right glenoid (coronal image 81). No superimposed right scapula fracture is identified. The right clavicle is not completely visible. Mild to moderate intramuscular hematoma tracking along the fractured proximal right humerus. Stable chronic posterior lateral 8th and 9th rib fractures. No acute displaced rib fracture is identified. Mild to moderate levoconvex thoracolumbar scoliosis. Progressed degenerative spinal endplate osteophytosis since 2019. Pubic symphysis degeneration. No other acute osseous abnormality. IMPRESSION: 1. Comminuted proximal right humerus fracture with glenohumeral subluxation or dislocation, and some intramuscular hematoma. 2. No other acute traumatic injury identified in the chest, abdomen, or pelvis. 3. Multiple chronic and/or incidental findings. Aortic Atherosclerosis (ICD10-I70.0). Electronically signed by: Helayne Hurst MD 11/16/2024 05:11 AM EST RP Workstation: [id number]   CT Cervical Spine Wo Contrast Result Date: 11/16/2024 EXAM: CT CERVICAL SPINE WITH CONTRAST 11/16/2024 04:43:42 AM TECHNIQUE: CT of the cervical spine was performed with the administration of 100 mL of intravenous iohexol  (OMNIPAQUE ) 300 MG/ML solution. Multiplanar reformatted images are provided for review. Automated exposure control, iterative reconstruction, and/or weight based adjustment of the mA/kV was utilized to reduce the radiation dose to as low as reasonably achievable. COMPARISON: CT head and face reported separately today. CLINICAL HISTORY: 84 year old female with multiple traumatic injuries, blunt force, found down on  bathroom floor after unobserved fall, pain. FINDINGS: BONES AND ALIGNMENT: Mild dextroconvex cervical scoliosis. Mild straightening of lordosis. 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 No acute fracture or traumatic malalignment. DEGENERATIVE CHANGES:  Bulky cervical disc and endplate degeneration C3-C4, C4-C5. Bulky left upper cervical facet arthropathy maximal at C2-C3. Probable moderate degenerative cervical spinal stenosis at C4-C5 (series 8 image 38). SOFT TISSUES: No prevertebral soft tissue swelling. Negative visible non-contrast neck soft tissues. Respiratory motion at the thoracic inlet. IMPRESSION: 1. No acute traumatic injury identified in the cervical spine. 2. 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 Electronically signed by: Helayne Hurst MD 11/16/2024 04:58 AM EST RP Workstation: [id number]   CT Maxillofacial Wo Contrast Result Date: 11/16/2024 EXAM: CT OF THE FACE WITHOUT CONTRAST 11/16/2024 04:43:42 AM TECHNIQUE: CT of the face was performed without the administration of intravenous contrast. Multiplanar reformatted images are provided for review. Automated exposure control, iterative reconstruction, and/or weight based adjustment of the mA/kV was utilized to reduce the radiation dose to as low as reasonably achievable. COMPARISON: CT head and cervical spine 11/16/2024 reported separately. CLINICAL HISTORY: 84 year old female, found down on bathroom floor after an unobserved fall, presenting with pain. FINDINGS: FACIAL BONES: Osteopenia in the face. Minimally displaced nasal bone fractures suspected (series 8 image 26). Overlying soft tissue swelling. Bilateral nasal processes of the maxilla appear to remain intact. No mandibular dislocation. No suspicious bone lesion. ORBITS: Globes are intact. Postoperative changes to both globes. The globes and intraorbital soft tissues remain normal. No acute traumatic injury. No inflammatory change. SINUSES AND MASTOIDS: Mild to moderate maxillary and ethmoid sinus opacification appears to be inflammatory, no layering sinus hemorrhage or fluid. Other paranasal sinuses, tympanic cavities and mastoids are well aerated. SOFT TISSUES: There is left greater  than right periorbital and preseptal space soft tissue swelling and stranding. No soft tissue gas identified. Negative visible non-contrast deep soft tissue spaces of the face. IMPRESSION: 1. Suspect minimally displaced nasal bone fractures. Overlying and regional soft tissue swelling/injury. 2. No other facial fracture identified. 3. Paranasal sinus inflammation. Electronically signed by: Helayne Hurst MD 11/16/2024 04:55 AM EST RP Workstation: [id number]   CT Head Wo Contrast Result Date: 11/16/2024 EXAM: CT HEAD WITHOUT CONTRAST 11/16/2024 04:43:42 AM TECHNIQUE: CT of the head was performed without the administration of intravenous contrast. Automated exposure control, iterative reconstruction, and/or weight based adjustment of the mA/kV was utilized to reduce the radiation dose to as low as reasonably achievable. COMPARISON: Brain MRI 08/19/2019. CT face and cervical spine reported separately today. CLINICAL HISTORY: 84 year old female. Polytrauma, blunt. Found down on bathroom floor, unobserved fall. Pain. FINDINGS: BRAIN AND VENTRICLES: No acute hemorrhage. No evidence of acute infarct. No hydrocephalus. No extra-axial collection. No mass effect or midline shift. Normal brain volume for age. Progressed chronic cerebral white matter changes, patchy and more confluent periventricular white matter hypodensity now, moderate for age. No suspicious intracranial vascular hyperdensity. Calcified atherosclerosis at the skull base. ORBITS: No acute abnormality. SINUSES: Mild to moderate maxillary and ethmoid sinus mucosal thickening and opacification. No layering sinus fluid or hemorrhage. Tympanic cavities and mastoids are well aerated. SOFT TISSUES AND SKULL: Broad based forehead heterogeneous scalp soft tissue swelling and stranding compatible with hematoma measures up to 10 mm in thickness. Soft tissue swelling continues over the bridge of the nose. No scalp soft tissue gas. Underlying frontal bones and frontal  sinuses appear intact. No skull fracture. Congenital incomplete ossification of the posterior C1  ring, normal variant. Face is detailed separately. IMPRESSION: 1. Forehead and nasal bridge soft tissue injury. 2. No acute intracranial abnormality. Moderate chronic white matter disease, progressed from 08/19/2019. Electronically signed by: Helayne Hurst MD 11/16/2024 04:51 AM EST RP Workstation: [id number]   DG Shoulder Right Result Date: 11/16/2024 EXAM: AP AND TRANSSCAPULAR Y 2 VIEW(S) XRAY OF THE RIGHT SHOULDER 11/16/2024 03:43:27 AM COMPARISON: None available. CLINICAL HISTORY: fall fall fall FINDINGS: BONES AND JOINTS: There is osteopenia. There is an acute closed oblique proximal right humeral shaft fracture without humeral head dislocation. The distal humeral fracture fragment demonstrates posterolateral angulation and one-half shaft width of medial displacement. The glenohumeral joint is normally aligned. [facility] joint is intact. The visualized right ribs are intact. SOFT TISSUES: No abnormal calcifications. Visualized lung is unremarkable. IMPRESSION: 1. Acute closed oblique proximal right humeral shaft fracture without humeral head dislocation, with posterolateral angulation and one-half shaft width of medial displacement of the distal humeral fracture fragment. 2. Osteopenia. Electronically signed by: Francis Quam MD 11/16/2024 03:52 AM EST RP Workstation: [id number]     .Ortho Injury Treatment  Date/Time: 11/16/2024 5:23 AM  Performed by: Raford Lenis, MD Authorized by: Raford Lenis, MD   Consent:    Consent obtained:  Verbal   Consent given by:  Patient   Risks discussed:  Stiffness   Alternatives discussed:  No treatmentInjury location: shoulder Location details: right shoulder Injury type: fracture Fracture type: surgical neck Pre-procedure neurovascular assessment: neurovascularly intact Pre-procedure distal perfusion: normal Pre-procedure neurological function:  normal Pre-procedure range of motion: reduced  Anesthesia: Local anesthesia used: no  Patient sedated: NoManipulation performed: no Immobilization: sling Splint Applied by: ED Nurse Supplies used: Sling. Post-procedure distal perfusion: normal Post-procedure neurological function: normal Post-procedure range of motion: unchanged      Medications Ordered in the ED - No data to display                                  Medical Decision Making Amount and/or Complexity of Data Reviewed Labs: ordered. Radiology: ordered.  Risk Prescription drug management.   Fall with facial injury and injury to right shoulder.  However, with mild chest tenderness and abdomen tenderness and dementia, I do not feel I can adequately rule out other injury by exam alone.  I have ordered CT pan scans and x-ray of right shoulder.  I reviewed her past records, and note Tdap booster on 05/24/2023, no indication for tetanus immunization today.  I have reviewed her laboratory tests, and my interpretation is mild anemia which is new and will need to be followed, elevated random glucose level which will need to be followed as an outpatient.  Right shoulder x-ray shows fracture of the proximal right humeral shaft.  CT of maxillofacial bones suggested bilateral nasal bone fracture without displacement.  CT of head shows soft tissue injury to the forehead but no intracranial injury.  CT of cervical spine shows no acute traumatic injury.  CT of chest/abdomen/pelvis shows no thoracic or abdominal injury but previously identified shoulder fracture is seen.  I have independently viewed all the images, and agree with the radiologist's interpretation.  I have ordered a sling be placed in her right forearm and I am referring her to orthopedics for follow-up.  I am recommending ice, over-the-counter NSAIDs and acetaminophen  as needed for pain.     Final diagnoses:  Fall at nursing home, initial encounter  Other closed  nondisplaced  fracture of proximal end of right humerus, initial encounter  Closed nondisplaced fracture of nasal bone, initial encounter  Normochromic normocytic anemia  Elevated random blood glucose level    ED Discharge Orders     None          Raford Lenis, MD 11/16/24 [phone]  "

## 2024-11-16 NOTE — Telephone Encounter (Signed)
 FYI  Received fax from after hours Access Nurse. Caller stated per fax that [name] with [facility], [phone]: pt fell this morning about 2:30 am, was sent to [facility]: injuries bridge of nose, nosebleed: pain in right wrist: does not know MD name: Has not yet heard back from hospital and caller declined triage.

## 2024-11-16 NOTE — ED Notes (Signed)
 Pts family at bedside and provided an update.

## 2024-11-16 NOTE — Telephone Encounter (Signed)
 ok

## 2024-11-16 NOTE — Discharge Instructions (Signed)
 Apply ice to sore areas for 30 minutes at a time, 4 times a day.  Wear the sling until you see the orthopedic doctor.  You may take acetaminophen  and/or ibuprofen as needed for pain.

## 2024-11-16 NOTE — ED Triage Notes (Signed)
 Pt bib [facility] from [facility] for a unwitnessed fall. Pt found on bathroom floor. Pt hit her head/ face and c/o of right shoulder pain. Pt has confusion at baseline.

## 2024-11-21 ENCOUNTER — Telehealth: Payer: Self-pay | Admitting: Family

## 2024-11-21 NOTE — Telephone Encounter (Signed)
 Pt has appt on Thursday she broke her shoulder and her nose she has a big knot on her forehead that has gone down. Pt could not sit up or keep her eyes open she seemed delirious Santana is concerned about the medications she is on, she is on lexapro , buspar  and thyroid  medications she is wondering if this is too strong for her?

## 2024-11-23 ENCOUNTER — Ambulatory Visit: Admitting: Family

## 2024-11-23 ENCOUNTER — Encounter: Payer: Self-pay | Admitting: Family

## 2024-11-23 ENCOUNTER — Other Ambulatory Visit: Payer: Self-pay | Admitting: Family

## 2024-11-23 ENCOUNTER — Ambulatory Visit (INDEPENDENT_AMBULATORY_CARE_PROVIDER_SITE_OTHER)

## 2024-11-23 VITALS — BP 136/80 | HR 69 | Temp 98.1°F | Ht 61.0 in | Wt 149.8 lb

## 2024-11-23 DIAGNOSIS — F02A4 Dementia in other diseases classified elsewhere, mild, with anxiety: Secondary | ICD-10-CM | POA: Diagnosis not present

## 2024-11-23 DIAGNOSIS — G300 Alzheimer's disease with early onset: Secondary | ICD-10-CM

## 2024-11-23 DIAGNOSIS — F411 Generalized anxiety disorder: Secondary | ICD-10-CM

## 2024-11-23 DIAGNOSIS — E559 Vitamin D deficiency, unspecified: Secondary | ICD-10-CM

## 2024-11-23 DIAGNOSIS — R410 Disorientation, unspecified: Secondary | ICD-10-CM

## 2024-11-23 DIAGNOSIS — M8000XA Age-related osteoporosis with current pathological fracture, unspecified site, initial encounter for fracture: Secondary | ICD-10-CM

## 2024-11-23 DIAGNOSIS — R051 Acute cough: Secondary | ICD-10-CM

## 2024-11-23 DIAGNOSIS — S42201D Unspecified fracture of upper end of right humerus, subsequent encounter for fracture with routine healing: Secondary | ICD-10-CM | POA: Diagnosis not present

## 2024-11-23 DIAGNOSIS — J4540 Moderate persistent asthma, uncomplicated: Secondary | ICD-10-CM

## 2024-11-23 DIAGNOSIS — R399 Unspecified symptoms and signs involving the genitourinary system: Secondary | ICD-10-CM

## 2024-11-23 DIAGNOSIS — E785 Hyperlipidemia, unspecified: Secondary | ICD-10-CM

## 2024-11-23 DIAGNOSIS — S022XXD Fracture of nasal bones, subsequent encounter for fracture with routine healing: Secondary | ICD-10-CM | POA: Diagnosis not present

## 2024-11-23 DIAGNOSIS — E039 Hypothyroidism, unspecified: Secondary | ICD-10-CM

## 2024-11-23 LAB — MICROSCOPIC EXAMINATION
RBC, Urine: NONE SEEN /HPF (ref 0–2)
Renal Epithel, UA: NONE SEEN /HPF
Yeast, UA: NONE SEEN

## 2024-11-23 LAB — URINALYSIS, COMPLETE
Bilirubin, UA: NEGATIVE
Glucose, UA: NEGATIVE
Ketones, UA: NEGATIVE
Nitrite, UA: NEGATIVE
RBC, UA: NEGATIVE
Specific Gravity, UA: 1.02 (ref 1.005–1.030)
Urobilinogen, Ur: 0.2 mg/dL (ref 0.2–1.0)
pH, UA: 6 (ref 5.0–7.5)

## 2024-11-23 MED ORDER — BUSPIRONE HCL 5 MG PO TABS: 5.0000 mg | ORAL_TABLET | Freq: Two times a day (BID) | ORAL | 2 refills | Status: AC | PRN

## 2024-11-23 MED ORDER — CEPHALEXIN 500 MG PO CAPS: 500.0000 mg | ORAL_CAPSULE | Freq: Two times a day (BID) | ORAL | 0 refills | Status: AC

## 2024-11-23 NOTE — Progress Notes (Signed)
 "  Subjective:    Patient ID: [name], female    DOB: 06/23/1941, 84 y.o.   MRN: [id number]  Chief Complaint  Patient presents with   Medical Management of Chronic Issues    6 week follow up Recent fall, ED visit   Pt presents to the office today for chronic follow up.    She has dementia and now staying at [facility]. She fell while standing on the toilet on 11/16/24 and fell and broke her nose and right shoulder. She has a referral with Ortho 11/28/24.  Family states she is more drowsy during the day. However, she is staying up til 4 AM. States she is more confused.  Asthma She complains of cough, shortness of breath and wheezing. This is a chronic problem. The current episode started more than 1 year ago. The problem occurs intermittently. Her symptoms are alleviated by rest. She reports moderate improvement on treatment. Her symptoms are not alleviated by rest. Her past medical history is significant for asthma.  Thyroid  Problem Presents for follow-up visit. Symptoms include anxiety and fatigue. Patient reports no constipation, depressed mood or dry skin. The symptoms have been stable.  Anxiety Presents for follow-up visit. Symptoms include excessive worry, nervous/anxious behavior and shortness of breath. Patient reports no depressed mood or irritability. Symptoms occur constantly. The severity of symptoms is mild.   Her past medical history is significant for asthma.  Hyperlipidemia This is a chronic problem. The current episode started more than 1 year ago. The problem is uncontrolled. Recent lipid tests were reviewed and are high. Exacerbating diseases include obesity. Associated symptoms include shortness of breath. Current antihyperlipidemic treatment includes diet change. The current treatment provides mild improvement of lipids. Risk factors for coronary artery disease include dyslipidemia, hypertension, a sedentary lifestyle, post-menopausal and obesity.      Review  of Systems  Constitutional:  Positive for fatigue. Negative for irritability.  Respiratory:  Positive for cough, shortness of breath and wheezing.   Gastrointestinal:  Negative for constipation.  Psychiatric/Behavioral:  The patient is nervous/anxious.   All other systems reviewed and are negative.  Family History  Problem Relation Age of Onset   Heart failure Mother    Emphysema Mother    Other Father        blood clot in brain   Breast cancer Sister    Cancer Sister        breast   Memory loss Sister    Breast cancer Sister    Cancer Sister        breast   Asthma Sister    Cancer Brother        lung   Memory loss Sister    Anesthesia problems Neg Hx    Hypotension Neg Hx    Malignant hyperthermia Neg Hx    Pseudochol deficiency Neg Hx    Social History   Socioeconomic History   Marital status: Widowed    Spouse name: Not on file   Number of children: 3   Years of education: 57   Highest education level: Not on file  Occupational History   Occupation: retired scientist, product/process development  Tobacco Use   Smoking status: Never   Smokeless tobacco: Never  Vaping Use   Vaping status: Never Used  Substance and Sexual Activity   Alcohol use: No   Drug use: No   Sexual activity: Not on file  Other Topics Concern   Not on file  Social History Narrative   Lives  at home alone    Right handed   Caffeine: 3 cups/day coffee, coke    Social Drivers of Health   Tobacco Use: Low Risk (11/23/2024)   Patient History    Smoking Tobacco Use: Never    Smokeless Tobacco Use: Never    Passive Exposure: Not on file  Financial Resource Strain: Not on file  Food Insecurity: No Food Insecurity (05/29/2024)   Epic    Worried About Programme Researcher, Broadcasting/film/video in the Last Year: Never true    Ran Out of Food in the Last Year: Never true  Transportation Needs: No Transportation Needs (05/29/2024)   Epic    Lack of Transportation (Medical): No    Lack of Transportation (Non-Medical): No  Physical  Activity: Not on file  Stress: Not on file  Social Connections: Not on file  Depression (PHQ2-9): High Risk (09/11/2024)   Depression (PHQ2-9)    PHQ-2 Score: 13  Alcohol Screen: Not on file  Housing: Low Risk (05/29/2024)   Epic    Unable to Pay for Housing in the Last Year: No    Number of Times Moved in the Last Year: 0    Homeless in the Last Year: No  Utilities: Not At Risk (05/29/2024)   Epic    Threatened with loss of utilities: No  Health Literacy: Not on file       Objective:   Physical Exam Vitals reviewed.  Constitutional:      General: She is not in acute distress.    Appearance: She is well-developed.  HENT:     Head: Normocephalic and atraumatic.      Comments: Bruising and swelling noted    Right Ear: Tympanic membrane normal.     Left Ear: Tympanic membrane normal.  Eyes:     Pupils: Pupils are equal, round, and reactive to light.  Neck:     Thyroid : No thyromegaly.  Cardiovascular:     Rate and Rhythm: Normal rate and regular rhythm.     Heart sounds: Normal heart sounds. No murmur heard. Pulmonary:     Effort: Pulmonary effort is normal. No respiratory distress.     Breath sounds: Normal breath sounds. No wheezing.  Abdominal:     General: Bowel sounds are normal. There is no distension.     Palpations: Abdomen is soft.     Tenderness: There is no abdominal tenderness.  Musculoskeletal:        General: Tenderness present.     Cervical back: Normal range of motion and neck supple.     Comments: Pain in right shoulder with any abduction. Arm in sling, 2+ swelling in hand.   Skin:    General: Skin is warm and dry.  Neurological:     Mental Status: She is alert and oriented to person, place, and time.     Cranial Nerves: No cranial nerve deficit.     Deep Tendon Reflexes: Reflexes are normal and symmetric.  Psychiatric:        Behavior: Behavior normal.        Thought Content: Thought content normal.        Cognition and Memory: Cognition is  impaired. Memory is impaired.        Judgment: Judgment normal.      BP 136/80   Pulse 69   Temp 98.1 F (36.7 C)   Ht 5' 1 (1.549 m)   Wt 149 lb 12.8 oz (67.9 kg)   SpO2 97%   BMI 28.30 kg/m  Assessment & Plan:  Terita P Napp comes in today with chief complaint of Medical Management of Chronic Issues (6 week follow up/Recent fall, ED visit)   Diagnosis and orders addressed:  1. Confusion (Primary) - CBC with Differential/Platelet - CMP14+EGFR - Urinalysis, Complete - TSH  2. 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 GAD (generalized anxiety disorder) - CBC with Differential/Platelet - CMP14+EGFR - busPIRone  (BUSPAR ) 5 MG tablet; Take 1 tablet (5 mg total) by mouth 2 (two) times daily as needed.  Dispense: 180 tablet; Refill: 2  4. Hyperlipidemia, unspecified hyperlipidemia type - CBC with Differential/Platelet - CMP14+EGFR  5. Hypothyroidism, unspecified type - CBC with Differential/Platelet - CMP14+EGFR - TSH  6. Moderate persistent asthma in adult without complication - CBC with Differential/Platelet - CMP14+EGFR  7. Age-related osteoporosis with current pathological fracture, initial encounter  - CBC with Differential/Platelet - CMP14+EGFR  8. Vitamin D  deficiency - CBC with Differential/Platelet - CMP14+EGFR  9. Acute cough - CBC with Differential/Platelet  10. 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 Closed fracture of nasal bone with routine healing, subsequent encounter  12. Closed fracture of proximal end of right humerus with routine healing, unspecified fracture morphology, subsequent encounter  13. UTI symptoms Force fluids AZO over the counter X2 days RTO prn Culture pending  - cephALEXin  (KEFLEX ) 500 MG capsule; Take 1 capsule (500 mg total) by mouth 2 (two) times daily.  Dispense: 14 capsule; Refill: 0    Labs pending Change  Buspar  to 5 mg BID prn from 7.5 mg BID Fall precautions  Chest x-ray pending  Urine culture pending, will start Keflex   Stress management  Health Maintenance reviewed Diet and exercise encouraged  Follow up plan: 2-4 weeks    Bari Learn, FNP    "

## 2024-11-23 NOTE — Telephone Encounter (Signed)
 She can decrease the Buspar  as needed and not scheduled to see if this helps.

## 2024-11-23 NOTE — Patient Instructions (Signed)
 Confusion Confusion is the inability to think with your usual speed or clarity. Confusion can be caused by many things. People who are confused often describe their thinking as cloudy or unclear. Confusion can also include feeling disoriented. This means you are unaware of where you are or who you are. You may also not know the date or time. When confused, you may have trouble remembering, paying attention, or making decisions. Some people also act aggressively when they are confused. In some cases, confusion may come on quickly. In other cases, it may develop slowly over time. Confusion may be caused by medical conditions such as: Infections, such as a urinary tract infection (UTI). Low levels of oxygen, which can develop from conditions such as long-term lung disorders. Decrease in brain function due to dementia and other conditions that affect the brain, such as seizures, strokes, brain tumors, or head injuries. Mental health conditions, like panic attacks, anxiety, depression, and hallucinations. Confusion may also be caused by physical factors such as: Loss of fluid (dehydration) or an imbalance of salts and minerals in the body (electrolytes). Lack of certain nutrients like niacin, thiamine, or other B vitamins. Fever or hypothermia, which is a sudden drop in body temperature. Low or high blood sugar. Low or high blood pressure. Other causes include: Lack of sleep or changes in routine or surroundings, such as when traveling or staying in a hospital. Using too much alcohol, drugs, or medicine. Side effects of medicines, or taking medicines that affect other medicines (drug interactions). Follow these instructions at home: Pay attention to your symptoms. Tell your health care provider about any changes or if you develop new symptoms. Follow these instructions to control or treat symptoms. Ask a family member or friend for help if needed. Medicines  Take over-the-counter and prescription  medicines only as told by your health care provider. Ask your health care provider about changing or stopping any medicines that may be causing your confusion. Avoid pain medicines or sleep medicines until you have fully recovered. Use a pillbox or an alarm to help you take the right medicines at the right time. Lifestyle  Eat a balanced diet that includes fruits and vegetables. Get enough sleep. For most adults, this is 7-9 hours each night. Do not drink alcohol. Do not become isolated. Spend time with other people and make plans for your days. Do not drive until your health care provider says that it is safe to do so. Do not use any products that contain nicotine or tobacco, such as cigarettes, e-cigarettes, and chewing tobacco. If you need help quitting, ask your health care provider. Stop other activities that may increase your chances of getting hurt. These may include some work duties, sports activities, swimming, or bike riding. Ask your health care provider what activities are safe for you. Tips for caregivers Find out if the person is confused. Ask the person to state his or her name, age, and the date. If the person is unsure or answers incorrectly, he or she may be confused and need assistance. Always introduce yourself, no matter how well the person knows you. Remind the person of his or her location. Place a calendar and clock near the person who is confused. Keep a regular schedule. Make sure the person has plenty of light during the day and sleep at night. Talk about current events and plans for the day. Keep the environment calm, quiet, and peaceful. Help the person do the things that he or she is unable to  do. These include: Taking medicines. Keeping medical appointments. Helping with household duties, including meal preparation. Running errands. Get help if you need it. There are several support groups for caregivers. If the person you are helping needs more support,  consider day care, extended-care programs, or a skilled nursing facility. The person's health care provider may be able to help evaluate these options. General instructions Monitor yourself for any conditions you may have. These can include: Checking your blood glucose levels if you have diabetes. Maintaining a healthy weight. Monitoring your blood pressure if you have hypertension. Monitoring your body temperature if you have a fever. Keep all follow-up visits. This is important. Contact a health care provider if: You have new symptoms or your symptoms get worse. Get help right away if you: Feel that you are not able to care for yourself. Develop severe headaches, repeated vomiting, seizures, blackouts, or slurred speech. Have increasing confusion, weakness, numbness, restlessness, or personality changes. Develop a loss of balance, have marked dizziness, feel uncoordinated, or fall. Develop severe anxiety, or you have delusions or hallucinations. These symptoms may represent a serious problem that is an emergency. Do not wait to see if the symptoms will go away. Get medical help right away. Call your local emergency services (911 in the [place] Do not drive yourself to the hospital. Summary Confusion is the inability to think with your usual speed or clarity. People who are confused often describe their thinking as cloudy or unclear. Confusion can also include having trouble remembering, paying attention, or making decisions. Confusion may come on quickly or develop slowly over time, depending on the cause. There are many different causes of confusion. Ask for help from family members or friends if you are unable to take care of yourself. This information is not intended to replace advice given to you by your health care provider. Make sure you discuss any questions you have with your health care provider. Document Revised: 07/09/2023 Document Reviewed: 07/09/2023 Elsevier Patient Education   2024 [employer].

## 2024-11-24 ENCOUNTER — Ambulatory Visit: Payer: Self-pay | Admitting: Family

## 2024-11-24 LAB — CBC WITH DIFFERENTIAL/PLATELET
Basophils Absolute: 0.1 10*3/uL (ref 0.0–0.2)
Basos: 1 %
EOS (ABSOLUTE): 0.2 10*3/uL (ref 0.0–0.4)
Eos: 2 %
Hematocrit: 33.2 % — ABNORMAL LOW (ref 34.0–46.6)
Hemoglobin: 10.6 g/dL — ABNORMAL LOW (ref 11.1–15.9)
Immature Grans (Abs): 0.1 10*3/uL (ref 0.0–0.1)
Immature Granulocytes: 1 %
Lymphocytes Absolute: 1.7 10*3/uL (ref 0.7–3.1)
Lymphs: 17 %
MCH: 30.4 pg (ref 26.6–33.0)
MCHC: 31.9 g/dL (ref 31.5–35.7)
MCV: 95 fL (ref 79–97)
Monocytes Absolute: 1 10*3/uL — ABNORMAL HIGH (ref 0.1–0.9)
Monocytes: 10 %
Neutrophils Absolute: 7.2 10*3/uL — ABNORMAL HIGH (ref 1.4–7.0)
Neutrophils: 69 %
Platelets: 404 10*3/uL (ref 150–450)
RBC: 3.49 x10E6/uL — ABNORMAL LOW (ref 3.77–5.28)
RDW: 13.1 % (ref 11.7–15.4)
WBC: 10.2 10*3/uL (ref 3.4–10.8)

## 2024-11-24 LAB — CMP14+EGFR
ALT: 28 [IU]/L (ref 0–32)
AST: 26 [IU]/L (ref 0–40)
Albumin: 4 g/dL (ref 3.7–4.7)
Alkaline Phosphatase: 73 [IU]/L (ref 48–129)
BUN/Creatinine Ratio: 23 (ref 12–28)
BUN: 15 mg/dL (ref 8–27)
Bilirubin Total: 0.6 mg/dL (ref 0.0–1.2)
CO2: 25 mmol/L (ref 20–29)
Calcium: 9.7 mg/dL (ref 8.7–10.3)
Chloride: 98 mmol/L (ref 96–106)
Creatinine, Ser: 0.65 mg/dL (ref 0.57–1.00)
Globulin, Total: 2.7 g/dL (ref 1.5–4.5)
Glucose: 95 mg/dL (ref 70–99)
Potassium: 3.5 mmol/L (ref 3.5–5.2)
Sodium: 138 mmol/L (ref 134–144)
Total Protein: 6.7 g/dL (ref 6.0–8.5)
eGFR: 88 mL/min/{1.73_m2}

## 2024-11-24 LAB — TSH: TSH: 12.5 u[IU]/mL — ABNORMAL HIGH (ref 0.450–4.500)

## 2024-11-24 MED ORDER — LEVOTHYROXINE SODIUM 100 MCG PO TABS: 100.0000 ug | ORAL_TABLET | Freq: Every day | ORAL | 3 refills | Status: AC

## 2024-11-24 NOTE — Telephone Encounter (Signed)
 Called and spoke with [name] she verbalized understanding.

## 2024-11-27 LAB — URINE CULTURE

## 2024-11-28 ENCOUNTER — Telehealth: Payer: Self-pay | Admitting: Family

## 2024-11-28 ENCOUNTER — Telehealth: Payer: Self-pay | Admitting: Family Medicine

## 2024-11-28 ENCOUNTER — Other Ambulatory Visit: Payer: Self-pay

## 2024-11-28 ENCOUNTER — Ambulatory Visit: Admitting: Orthopedic Surgery

## 2024-11-28 DIAGNOSIS — M25511 Pain in right shoulder: Secondary | ICD-10-CM

## 2024-11-28 DIAGNOSIS — W19XXXA Unspecified fall, initial encounter: Secondary | ICD-10-CM

## 2024-11-28 DIAGNOSIS — S022XXD Fracture of nasal bones, subsequent encounter for fracture with routine healing: Secondary | ICD-10-CM

## 2024-11-28 DIAGNOSIS — S42291A Other displaced fracture of upper end of right humerus, initial encounter for closed fracture: Secondary | ICD-10-CM

## 2024-11-28 DIAGNOSIS — S42201D Unspecified fracture of upper end of right humerus, subsequent encounter for fracture with routine healing: Secondary | ICD-10-CM

## 2024-11-28 NOTE — Telephone Encounter (Unsigned)
 Copied from CRM [id number]. Topic: Clinical - Prescription Issue >> Nov 28, 2024 12:17 PM Alfonso ORN wrote: Reason for CRM: Hawks,Christy need to a new order sent over change from 80 to 100 mg   levothyroxine  (SYNTHROID ) 100 MCG tablet will not  start patient on the medication until  [facility] ,christy send the new order over  also the busPIRone  (BUSPAR ) 5 MG tablet was changed as well   , it was 7.5 and was changed to 5 mg       ----------------------------------------------------------------------- From previous Reason for Contact - Medication Question: Reason for CRM: >> Nov 28, 2024 12:26 PM Alfonso ORN wrote: Need to send over to [facility]

## 2024-11-28 NOTE — Telephone Encounter (Signed)
 Referral placed

## 2024-11-28 NOTE — Telephone Encounter (Signed)
 Referral already placed called penny to let her know

## 2024-11-28 NOTE — Progress Notes (Unsigned)
 New Patient Visit  Summary: [name] is a 84 y.o. female with the following: Right proximal humerus fracture  Assessment & Plan Displaced fracture of right proximal humerus Displaced proximal humerus fracture in an elderly woman with osteoporosis, early healing phase. Imaging shows mild displacement and impaction without dislocation or worsening. Non-operative management appropriate due to age, functional status, and typical healing course. Anticipated outcome includes pain and function improvement, though full range of motion may not return. Surgical intervention remains an option if dissatisfied with function or pain post-healing, but not recommended now. - Continued sling immobilization for 4-6 weeks from injury. - Permitted light finger and hand movement in sling to prevent stiffness. - Permitted sling removal for showering, arm resting across abdomen. - Instructed to avoid right arm use outside sling except as above. - Planned gentle shoulder stretching and light activities after 4 weeks, as tolerated. - Ordered follow-up in 2 weeks with repeat radiographs to monitor healing and alignment. - Provided guidance that full range of motion may not return, but pain and function often improve with healing. - Discussed surgical intervention as an option if dissatisfied with function or pain post-healing, but not recommended now. - Instructed to notify clinic if symptoms change or concerns arise before follow-up.     Follow-up: Return in about 2 weeks (around 12/12/2024).  Subjective:  Chief Complaint  Patient presents with   Fracture    R humerus DOI 11/16/24     Discussed the use of AI scribe software for clinical note transcription with the patient, who gave verbal consent to proceed.  History of Present Illness [name] is an 84 year old right-hand dominant female with osteoporosis who presents for evaluation of right proximal humerus fracture following a fall.  She  fell at [facility] on about November 16, 2024 and developed acute right shoulder pain with inability to move the arm. She has worn a sling continuously since then, removing it only for showering. She reports persistent soreness of the right shoulder without new or worsening symptoms.  She uses Tylenol  as needed for pain and has not started physical therapy or other treatments for the shoulder. She has had no additional falls or trauma to the right upper extremity.  During the same fall she sustained a nasal fracture, a large scalp hematoma, bilateral periorbital ecchymoses, and broke her glasses. She reports no other injuries or complications from that event.    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   Osteoporosis    Postinflammatory pulmonary fibrosis (HCC)    Vertigo    Vitamin D  deficiency 11/21/2015    Past Surgical History:  Procedure Laterality Date   CATARACT EXTRACTION W/PHACO  10/01/2011   Procedure: CATARACT EXTRACTION PHACO AND INTRAOCULAR LENS PLACEMENT (IOC);  Surgeon: Cherene Mania;  Location: [facility];  Service: Ophthalmology;  Laterality: Left;  CDE:13.74   CATARACT EXTRACTION W/PHACO  10/22/2011   Procedure: CATARACT EXTRACTION PHACO AND INTRAOCULAR LENS PLACEMENT (IOC);  Surgeon: Cherene Mania;  Location: [facility];  Service: Ophthalmology;  Laterality: Right;  CDE:9.98  CHOLECYSTECTOMY  2010   TUBAL LIGATION      Family History  Problem Relation Age of Onset   Heart failure Mother    Emphysema Mother    Other Father        blood clot in brain    Breast cancer Sister    Cancer Sister        breast   Memory loss Sister    Breast cancer Sister    Cancer Sister        breast   Asthma Sister    Cancer Brother        lung   Memory loss Sister    Anesthesia problems Neg Hx    Hypotension Neg Hx    Malignant hyperthermia Neg Hx    Pseudochol deficiency Neg Hx    Social History[1]  Allergies[2]  Active Medications[3]  Objective: There were no vitals taken for this visit.  Physical Exam:    General: Elderly female., Alert and oriented., and No acute distress.  Healing black eyes. Gait: Normal gait.  Physical Exam NEUROLOGICAL: Intact sensation in the axillary nerve distribution.  There is some residual bruising.  No obvious deformity of the right shoulder.  Sling is fitting appropriately.  Fingers are warm and well-perfused.   IMAGING: I personally ordered and reviewed the following images  X-rays of the right shoulder were obtained in clinic today.  These are compared to prior x-rays.  There is some pseudosubluxation of the joint.  On the scapular Y view, the humeral head is within the glenoid.  Proximal humerus fracture with some impaction and shortening.  No significant change or interval displacement since the last x-rays.  No bony lesions.  Impression: Stable right proximal humerus fracture     New Medications:  No orders of the defined types were placed in this encounter.     Portions of this note were completed via Scientist, clinical (histocompatibility and immunogenetics).  Gabrielle DELENA Horde, MD  11/29/2024 9:35 AM      [1]  Social History Tobacco Use   Smoking status: Never   Smokeless tobacco: Never  Vaping Use   Vaping status: Never Used  Substance Use Topics   Alcohol use: No   Drug use: No  [2]  Allergies Allergen Reactions   Sulfonamide Derivatives   [3]  Current Meds  Medication Sig   acetaminophen  (TYLENOL ) 500 MG tablet Take 500-1,000 mg by mouth as needed.   busPIRone  (BUSPAR ) 5 MG tablet Take 1 tablet (5 mg  total) by mouth 2 (two) times daily as needed.   cephALEXin  (KEFLEX ) 500 MG capsule Take 1 capsule (500 mg total) by mouth 2 (two) times daily.   escitalopram  (LEXAPRO ) 20 MG tablet Take 1 tablet (20 mg total) by mouth daily.   levothyroxine  (SYNTHROID ) 100 MCG tablet Take 1 tablet (100 mcg total) by mouth daily.

## 2024-11-28 NOTE — Telephone Encounter (Signed)
 Copied from CRM #[id number]. Topic: Referral - Request for Referral >> Nov 27, 2024  4:32 PM Delon DASEN wrote: Did the patient discuss referral with their provider in the last year? Yes (If No - schedule appointment) (If Yes - send message)  Appointment offered? Yes  Type of order/referral and detailed reason for visit: ortho   Preference of office, provider, location: [place] [facility]  If referral order, have you been seen by this specialty before? No (If Yes, this issue or another issue? When? Where?  Can we respond through MyChart? No  Call [facility] tomorrow- [phone] if they can pick up referral tomorrow or fax over [facility]

## 2024-11-28 NOTE — Telephone Encounter (Unsigned)
 Copied from CRM [id number]. Topic: Clinical - Medication Refill >> Nov 28, 2024 12:20 PM Alfonso ORN wrote: Medication: busPIRone  (BUSPAR ) 5 MG tablet  Has the patient contacted their pharmacy? Yes pharmacy stated need a new prescription since mg been reduce to 5 mg   (Agent: If no, request that the patient contact the pharmacy for the refill. If patient does not wish to contact the pharmacy document the reason why and proceed with request.) (Agent: If yes, when and what did the pharmacy advise?)  This is the patient's preferred pharmacy:  [id number] - [place], [place] - [address] [place] [address] Phone: [phone] Fax: [phone]  Is this the correct pharmacy for this prescription? Yes If no, delete pharmacy and type the correct one.   Has the prescription been filled recently? No  Is the patient out of the medication? No  Has the patient been seen for an appointment in the last year OR does the patient have an upcoming appointment? Yes  Can we respond through MyChart? Yes  Agent: Please be advised that Rx refills may take up to 3 business days. We ask that you follow-up with your pharmacy.

## 2024-11-29 ENCOUNTER — Encounter: Payer: Self-pay | Admitting: Orthopedic Surgery

## 2024-11-29 ENCOUNTER — Telehealth: Payer: Self-pay | Admitting: Family

## 2024-11-29 NOTE — Telephone Encounter (Signed)
 Copied from CRM [id number]. Topic: Clinical - Prescription Issue >> Nov 29, 2024 12:28 PM Tobias CROME wrote: Reason for CRM: Pam with [facility] [place] states patient's insurance is not covering the buspirone  5 mg tablet. Requesting Christy discontinue the medication right now.   Insurance will cover after 12/07/24 and will reach out to request it then.   Pam will be faxing discontinue order, Bari may sign and date it and send it back if she is in agrees.

## 2024-12-13 ENCOUNTER — Encounter: Admitting: Orthopedic Surgery

## 2024-12-21 ENCOUNTER — Ambulatory Visit: Admitting: Family

## 2025-03-12 ENCOUNTER — Ambulatory Visit: Admitting: Family
